# Patient Record
Sex: Female | Born: 1945 | Hispanic: Yes | Smoking: Never smoker
Health system: Southern US, Community
[De-identification: ages and names within clinical notes are randomized; demographics above are authoritative.]

## PROBLEM LIST (undated history)

## (undated) DIAGNOSIS — N939 Abnormal uterine and vaginal bleeding, unspecified: Secondary | ICD-10-CM

## (undated) DIAGNOSIS — C541 Malignant neoplasm of endometrium: Secondary | ICD-10-CM

## (undated) DIAGNOSIS — I10 Essential (primary) hypertension: Secondary | ICD-10-CM

## (undated) DIAGNOSIS — M8580 Other specified disorders of bone density and structure, unspecified site: Secondary | ICD-10-CM

## (undated) DIAGNOSIS — R7303 Prediabetes: Secondary | ICD-10-CM

## (undated) DIAGNOSIS — E119 Type 2 diabetes mellitus without complications: Secondary | ICD-10-CM

## (undated) DIAGNOSIS — E785 Hyperlipidemia, unspecified: Secondary | ICD-10-CM

## (undated) DIAGNOSIS — R740 Nonspecific elevation of levels of transaminase and lactic acid dehydrogenase [LDH]: Secondary | ICD-10-CM

## (undated) DIAGNOSIS — N950 Postmenopausal bleeding: Secondary | ICD-10-CM

## (undated) DIAGNOSIS — E669 Obesity, unspecified: Secondary | ICD-10-CM

## (undated) DIAGNOSIS — Z923 Personal history of irradiation: Secondary | ICD-10-CM

## (undated) HISTORY — DX: Nonspecific elevation of levels of transaminase and lactic acid dehydrogenase (ldh): R74.0

## (undated) HISTORY — PX: MULTIPLE TOOTH EXTRACTIONS: SHX2053

## (undated) HISTORY — DX: Essential (primary) hypertension: I10

## (undated) HISTORY — DX: Hyperlipidemia, unspecified: E78.5

## (undated) HISTORY — DX: Other specified disorders of bone density and structure, unspecified site: M85.80

## (undated) HISTORY — DX: Malignant neoplasm of endometrium: C54.1

## (undated) HISTORY — PX: TUBAL LIGATION: SHX77

## (undated) HISTORY — DX: Abnormal uterine and vaginal bleeding, unspecified: N93.9

## (undated) HISTORY — DX: Prediabetes: R73.03

## (undated) HISTORY — DX: Obesity, unspecified: E66.9

---
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
## 2001-01-03 ENCOUNTER — Ambulatory Visit (HOSPITAL_BASED_OUTPATIENT_CLINIC_OR_DEPARTMENT_OTHER): Admission: RE | Admit: 2001-01-03 | Discharge: 2001-01-03 | Payer: Self-pay | Admitting: General Surgery

## 2013-07-17 ENCOUNTER — Encounter: Payer: Self-pay | Admitting: *Deleted

## 2013-07-18 ENCOUNTER — Encounter: Payer: Self-pay | Admitting: Obstetrics & Gynecology

## 2013-07-18 ENCOUNTER — Other Ambulatory Visit (HOSPITAL_COMMUNITY)
Admission: RE | Admit: 2013-07-18 | Discharge: 2013-07-18 | Disposition: A | Discharge status: Home / Self Care | Payer: Self-pay | Source: Ambulatory Visit | Attending: Obstetrics & Gynecology | Admitting: Obstetrics & Gynecology

## 2013-07-18 ENCOUNTER — Ambulatory Visit (INDEPENDENT_AMBULATORY_CARE_PROVIDER_SITE_OTHER): Payer: Self-pay | Admitting: Obstetrics & Gynecology

## 2013-07-18 VITALS — BP 169/75 | HR 68 | Temp 97.2°F | Resp 16 | Ht 61.0 in | Wt 179.1 lb

## 2013-07-18 DIAGNOSIS — E669 Obesity, unspecified: Secondary | ICD-10-CM | POA: Insufficient documentation

## 2013-07-18 DIAGNOSIS — Z0000 Encounter for general adult medical examination without abnormal findings: Secondary | ICD-10-CM

## 2013-07-18 DIAGNOSIS — Z01419 Encounter for gynecological examination (general) (routine) without abnormal findings: Secondary | ICD-10-CM

## 2013-07-18 DIAGNOSIS — N950 Postmenopausal bleeding: Secondary | ICD-10-CM | POA: Insufficient documentation

## 2013-07-18 HISTORY — DX: Postmenopausal bleeding: N95.0

## 2013-07-18 NOTE — Progress Notes (Signed)
  Subjective:    Patient ID: Julie Orozco, female    DOB: 1946-01-15, 67 y.o.   MRN: [id number]  HPI This morbidly obese Hispanic lady is here today with the complaint of PMB. Initially she said this started last month. However, with further questioning, she reports that this occurred 2 years ago and then resolved until last month.   Review of Systems Pap and mammogram are due.    Objective:   Physical Exam   UPT negative, consent signed, time out done Cervix prepped with betadine and grasped with a single tooth tenaculum Uterus sounded to 12 cm Pipelle used for 3 passes with a large amount of tissue obtained. She tolerated the procedure well.       Assessment & Plan:  PMB- Await EMBX results Schedule mammogram Pap smear done today Schedule gyn u/s

## 2013-07-19 ENCOUNTER — Encounter: Payer: Self-pay | Admitting: *Deleted

## 2013-07-19 ENCOUNTER — Ambulatory Visit (HOSPITAL_COMMUNITY)
Admission: RE | Admit: 2013-07-19 | Discharge: 2013-07-19 | Disposition: A | Discharge status: Home / Self Care | Payer: Self-pay | Source: Ambulatory Visit | Attending: Obstetrics & Gynecology | Admitting: Obstetrics & Gynecology

## 2013-07-19 DIAGNOSIS — R9389 Abnormal findings on diagnostic imaging of other specified body structures: Secondary | ICD-10-CM | POA: Insufficient documentation

## 2013-07-19 DIAGNOSIS — N950 Postmenopausal bleeding: Secondary | ICD-10-CM | POA: Insufficient documentation

## 2013-07-30 ENCOUNTER — Ambulatory Visit (INDEPENDENT_AMBULATORY_CARE_PROVIDER_SITE_OTHER): Payer: Self-pay | Admitting: Obstetrics & Gynecology

## 2013-07-30 DIAGNOSIS — N950 Postmenopausal bleeding: Secondary | ICD-10-CM

## 2013-07-30 MED ORDER — MEDROXYPROGESTERONE ACETATE 10 MG PO TABS: ORAL_TABLET | ORAL | Status: DC

## 2013-08-07 ENCOUNTER — Encounter: Payer: Self-pay | Admitting: Obstetrics & Gynecology

## 2013-08-07 NOTE — Progress Notes (Signed)
   Subjective:    Patient ID: Julie Orozco, female    DOB: 1946-04-01, 67 y.o.   MRN: [id number]  HPI  67 yo who was seen recently for PMB. I did an EMBX which came back as normal and ordered an u/s which showed an endometrium 13 mm in thickness. She is no longer having PMB.  Review of Systems     Objective:   Physical Exam        Assessment & Plan:  I gave her the news of her benign pathology but I did tell her that we need to thin out her lining. I have given her Provera 10 mg for 10 days per month for the next 3 months. She will then have another u/s to assure thinning of the uterine lining. If her lining is still thickened, then she will need a d&c. RTC 5 months

## 2013-08-14 ENCOUNTER — Ambulatory Visit (HOSPITAL_COMMUNITY)
Admission: RE | Admit: 2013-08-14 | Discharge: 2013-08-14 | Disposition: A | Discharge status: Home / Self Care | Payer: Self-pay | Source: Ambulatory Visit | Attending: Obstetrics & Gynecology | Admitting: Obstetrics & Gynecology

## 2013-08-14 DIAGNOSIS — Z0000 Encounter for general adult medical examination without abnormal findings: Secondary | ICD-10-CM

## 2013-11-01 ENCOUNTER — Ambulatory Visit (HOSPITAL_COMMUNITY)
Admission: RE | Admit: 2013-11-01 | Discharge: 2013-11-01 | Disposition: A | Discharge status: Home / Self Care | Payer: Self-pay | Source: Ambulatory Visit | Attending: Obstetrics & Gynecology | Admitting: Obstetrics & Gynecology

## 2013-11-01 DIAGNOSIS — N950 Postmenopausal bleeding: Secondary | ICD-10-CM | POA: Insufficient documentation

## 2013-12-10 ENCOUNTER — Encounter: Payer: Self-pay | Admitting: Obstetrics & Gynecology

## 2013-12-10 ENCOUNTER — Ambulatory Visit (INDEPENDENT_AMBULATORY_CARE_PROVIDER_SITE_OTHER): Payer: Self-pay | Admitting: Obstetrics & Gynecology

## 2013-12-10 VITALS — BP 124/84 | HR 69 | Temp 97.7°F | Wt 183.4 lb

## 2013-12-10 DIAGNOSIS — R9389 Abnormal findings on diagnostic imaging of other specified body structures: Secondary | ICD-10-CM

## 2013-12-10 NOTE — Progress Notes (Signed)
Pt. Here today for follow up. Pt. States she is taking a BP medication but is unsure of what it is; is not taking medication we have on file.

## 2013-12-10 NOTE — Patient Instructions (Signed)
Dilatación y curetaje o curetaje por aspiración °(Dilation and Curettage or Vacuum Curettage) °La dilatación y el curetaje por aspiración son procedimientos menores. Consiste en la apertura (dilatación) del cuello del útero y el raspado (curetaje) en la superficie interna del útero. Durante este procedimiento, el tejido del interior del útero se raspa suavemente. Durante el curetaje por aspiración, se utiliza una succión suave para extirpar tejidos del útero.  °El curetaje podrá realizarse para diagnóstico o para tratar un problema. Como procedimiento diagnóstico, se realiza para examinar los tejidos del útero. Un diagnóstico con curetaje se realiza en caso de presentarse los siguientes síntomas:  °· Sangrado irregular en el útero. °· Hemorragias con coágulos. °· Pérdida de sangre entre los períodos menstruales. °· Períodos menstruales prolongados. °· Sangrado luego de la menopausia. °· Falta de periodo menstrual (amenorrea). °· Cambio en el tamaño y forma del útero. °Como tratamiento, el curetaje podrá realizarse por las siguientes causas:  °· Remoción del DIU (dispositivo intrauterino). °· Remoción de placenta retenida luego del parto. La placenta retenida puede causar una hemorragia lo suficientemente grave como para requerir transfusiones o puede provocar una infección. °· Aborto. °· Aborto espontáneo. °· Extirpación de pólipos en el interior del útero. °· Extirpación de fibromas no frecuentes (bultos no cancerosos). °INFORME A SU MÉDICO:  °· Cualquier alergia que tenga. °· Todos los medicamentos que utiliza, incluyendo vitaminas, hierbas, gotas oftálmicas, cremas y medicamentos de venta libre. °· Problemas previos que usted o los miembros de su familia hayan tenido con el uso de anestésicos. °· Enfermedades de la sangre. °· Cirugías previas. °· Padecimientos médicos. °RIESGOS Y COMPLICACIONES  °Generalmente es un procedimiento seguro. Sin embargo, como en cualquier procedimiento, pueden surgir complicaciones.  Las complicaciones posibles son: °· Sangrado excesivo. °· Infección en el útero. °· Lesión en el cuello del útero. °· Desarrollo de tejido cicatrizal (adherencias) dentro del útero que posteriormente causan hemorragia menstrual en cantidad anormal. °· Complicaciones de la anestesia general, si se ha usado. °· Perforación del útero. Esto es raro. °ANTES DEL PROCEDIMIENTO  °· Coma y beba antes del procedimiento sólo lo que le indique el médico. °· Arregle con alguna persona para que la lleve a su casa. °PROCEDIMIENTO  °El procedimiento demora entre 15 y 30 minutos. °· Podrán administrarle uno de los siguientes medicamentos: °¨ Medicamentos que adormecen el área del cuello uterino (anestesia local). °¨ Un medicamento para que duerma durante el procedimiento (anestesia general). °· Deberá recostarse sobre la espalda con las piernas en los estribos. °· Le colocarán en la vagina un instrumento metálico o plástico entibiado espéculo) para mantenerla abierta y permitir al profesional visualizar el cuello del útero. °· Hay dos formas en las que el cuello del útero puede ser ablandado y dilatado. Pueden ser: °¨ Tomar medicamentos. °¨ Inserción de unas varillas delgadas (laminarias) en el cuello del útero. °· Se utilizará un instrumento curvo (cureta)para raspar las células de la membrana que cubre el interior del útero. En algunos casos, se aplica una succión suave con la cureta. Luego la cureta se retirará. °DESPUÉS DEL PROCEDIMIENTO  °· Descansará en una sala de recuperación hasta que se sienta estable y lista para volver a su casa. °· Podrá tener náuseas o vómitos si le han administrado anestesia general. °· Sentirá dolor de garganta si le han colocado un tubo durante la anestesia general. °· Podrá sentir algunos cólicos y tener una pequeña hemorragia. Estas molestias pueden durar entre 2 días y 2 semanas después del procedimiento. °· Luego del procedimiento, el útero formará una   administrado anestesia general.   Sentir dolor de garganta si le han colocado un tubo durante la anestesia general.   Podr sentir algunos clicos y tener una pequea hemorragia. Estas molestias pueden durar entre 2 das y 2 semanas despus del procedimiento.   Luego del procedimiento, el tero formar una nueva cubierta. Esto puede hacer que el  prximo perodo se retrase.  Document Released: 02/02/2008 Document Revised: 04/18/2013  [employer] Patient Information 2014 [employer].

## 2013-12-10 NOTE — Progress Notes (Signed)
   Subjective:    Patient ID: Julie Orozco, female    DOB: 1946/04/23, 68 y.o.   MRN: [id number]  HPI 68 yo H lady with a h/o PMB. She had a negative EMBX last year and took provera in a cyclic fashion. An u/s done this month showed an endometrial stripe of 7 mm. She is not having PMB currently.  Review of Systems     Objective:   Physical Exam        Assessment & Plan:   Thickened endometrium in a post menopausal lady. I will schedule her for a dilation and curettage.

## 2014-01-16 NOTE — Patient Instructions (Addendum)
   Your procedure is scheduled on:  Friday, May 29  Enter through the Main Entrance of [facility] at:   Gove up the phone at the desk and dial [phone] and inform us of your arrival.  Please call this number if you have any problems the morning of surgery: [phone]  Remember: Do not eat food after midnight: Thursday Do not drink clear liquids after: 9 AM Friday, day of surgery Take these medicines the morning of surgery with a SIP OF WATER:  lisinopril  Do not wear jewelry, make-up, or FINGER nail polish No metal in your hair or on your body. Do not wear lotions, powders, perfumes.  You may wear deodorant.  Do not bring valuables to the hospital. Contacts, dentures or bridgework may not be worn into surgery.  Patients discharged on the day of surgery will not be allowed to drive home.  Home with daughter Violet.

## 2014-01-22 ENCOUNTER — Encounter (HOSPITAL_COMMUNITY)
Admission: RE | Admit: 2014-01-22 | Discharge: 2014-01-22 | Disposition: A | Discharge status: Home / Self Care | Payer: Self-pay | Source: Ambulatory Visit | Attending: Obstetrics & Gynecology | Admitting: Obstetrics & Gynecology

## 2014-01-22 ENCOUNTER — Encounter (HOSPITAL_COMMUNITY): Payer: Self-pay

## 2014-01-22 DIAGNOSIS — Z01812 Encounter for preprocedural laboratory examination: Secondary | ICD-10-CM | POA: Insufficient documentation

## 2014-01-22 DIAGNOSIS — Z01810 Encounter for preprocedural cardiovascular examination: Secondary | ICD-10-CM | POA: Insufficient documentation

## 2014-01-22 LAB — BASIC METABOLIC PANEL
BUN: 15 mg/dL (ref 6–23)
CALCIUM: 9.6 mg/dL (ref 8.4–10.5)
CO2: 28 meq/L (ref 19–32)
CREATININE: 0.61 mg/dL (ref 0.50–1.10)
Chloride: 100 mEq/L (ref 96–112)
Glucose, Bld: 103 mg/dL — ABNORMAL HIGH (ref 70–99)
Potassium: 4.7 mEq/L (ref 3.7–5.3)
SODIUM: 139 meq/L (ref 137–147)

## 2014-01-22 LAB — CBC
HCT: 41.9 % (ref 36.0–46.0)
Hemoglobin: 13.9 g/dL (ref 12.0–15.0)
MCH: 30.7 pg (ref 26.0–34.0)
MCHC: 33.2 g/dL (ref 30.0–36.0)
MCV: 92.5 fL (ref 78.0–100.0)
Platelets: 255 10*3/uL (ref 150–400)
RBC: 4.53 MIL/uL (ref 3.87–5.11)
RDW: 13.9 % (ref 11.5–15.5)
WBC: 9.5 10*3/uL (ref 4.0–10.5)

## 2014-01-22 NOTE — Pre-Procedure Instructions (Signed)
Used [employer], Administrator, sports during [employer].

## 2014-01-25 ENCOUNTER — Encounter (HOSPITAL_COMMUNITY)
Admission: RE | Disposition: A | Discharge status: Home / Self Care | Payer: Self-pay | Source: Ambulatory Visit | Attending: Obstetrics & Gynecology

## 2014-01-25 ENCOUNTER — Ambulatory Visit (HOSPITAL_COMMUNITY): Payer: Self-pay | Admitting: Anesthesiology

## 2014-01-25 ENCOUNTER — Encounter (HOSPITAL_COMMUNITY): Payer: Self-pay | Admitting: Anesthesiology

## 2014-01-25 ENCOUNTER — Ambulatory Visit (HOSPITAL_COMMUNITY)
Admission: RE | Admit: 2014-01-25 | Discharge: 2014-01-25 | Disposition: A | Discharge status: Home / Self Care | Payer: Self-pay | Source: Ambulatory Visit | Attending: Obstetrics & Gynecology | Admitting: Obstetrics & Gynecology

## 2014-01-25 DIAGNOSIS — N950 Postmenopausal bleeding: Secondary | ICD-10-CM | POA: Insufficient documentation

## 2014-01-25 DIAGNOSIS — I10 Essential (primary) hypertension: Secondary | ICD-10-CM | POA: Insufficient documentation

## 2014-01-25 DIAGNOSIS — Z79899 Other long term (current) drug therapy: Secondary | ICD-10-CM | POA: Insufficient documentation

## 2014-01-25 DIAGNOSIS — E669 Obesity, unspecified: Secondary | ICD-10-CM | POA: Insufficient documentation

## 2014-01-25 DIAGNOSIS — R9389 Abnormal findings on diagnostic imaging of other specified body structures: Secondary | ICD-10-CM | POA: Insufficient documentation

## 2014-01-25 HISTORY — PX: DILATION AND CURETTAGE OF UTERUS: SHX78

## 2014-01-25 SURGERY — DILATION AND CURETTAGE
Anesthesia: Monitor Anesthesia Care | Site: Vagina

## 2014-01-25 MED ORDER — FENTANYL CITRATE 0.05 MG/ML IJ SOLN
INTRAMUSCULAR | Status: AC
  Filled 2014-01-25: qty 2

## 2014-01-25 MED ORDER — FENTANYL CITRATE 0.05 MG/ML IJ SOLN: 25.0000 ug | INTRAMUSCULAR | Status: DC | PRN

## 2014-01-25 MED ORDER — LIDOCAINE HCL (CARDIAC) 20 MG/ML IV SOLN
INTRAVENOUS | Status: AC
  Filled 2014-01-25: qty 5

## 2014-01-25 MED ORDER — IBUPROFEN 600 MG PO TABS: 600.0000 mg | ORAL_TABLET | Freq: Four times a day (QID) | ORAL | Status: DC | PRN

## 2014-01-25 MED ORDER — MIDAZOLAM HCL 2 MG/2ML IJ SOLN
INTRAMUSCULAR | Status: AC
  Filled 2014-01-25: qty 2

## 2014-01-25 MED ORDER — KETOROLAC TROMETHAMINE 30 MG/ML IJ SOLN
INTRAMUSCULAR | Status: DC | PRN
Start: 2014-01-25 — End: 2014-01-25
  Administered 2014-01-25: 15 mg via INTRAVENOUS

## 2014-01-25 MED ORDER — DOXYCYCLINE HYCLATE 100 MG IV SOLR
100.0000 mg | Freq: Once | INTRAVENOUS | Status: AC
  Administered 2014-01-25: 100 mg via INTRAVENOUS
  Filled 2014-01-25: qty 100

## 2014-01-25 MED ORDER — PROPOFOL 10 MG/ML IV EMUL
INTRAVENOUS | Status: AC
  Filled 2014-01-25: qty 20

## 2014-01-25 MED ORDER — KETOROLAC TROMETHAMINE 30 MG/ML IJ SOLN: 15.0000 mg | Freq: Once | INTRAMUSCULAR | Status: DC | PRN

## 2014-01-25 MED ORDER — MIDAZOLAM HCL 2 MG/2ML IJ SOLN
INTRAMUSCULAR | Status: DC | PRN
  Administered 2014-01-25: 1 mg via INTRAVENOUS

## 2014-01-25 MED ORDER — LIDOCAINE HCL (CARDIAC) 20 MG/ML IV SOLN
INTRAVENOUS | Status: DC | PRN
  Administered 2014-01-25: 50 mg via INTRAVENOUS

## 2014-01-25 MED ORDER — LACTATED RINGERS IV SOLN
INTRAVENOUS | Status: DC
  Administered 2014-01-25: 11:00:00 via INTRAVENOUS

## 2014-01-25 MED ORDER — PROPOFOL 10 MG/ML IV EMUL
INTRAVENOUS | Status: DC | PRN
  Administered 2014-01-25 (×2): 20 mg via INTRAVENOUS
  Administered 2014-01-25: 30 mg via INTRAVENOUS

## 2014-01-25 MED ORDER — BUPIVACAINE HCL 0.5 % IJ SOLN
INTRAMUSCULAR | Status: DC | PRN
  Administered 2014-01-25: 30 mL

## 2014-01-25 MED ORDER — ONDANSETRON HCL 4 MG/2ML IJ SOLN
INTRAMUSCULAR | Status: DC | PRN
  Administered 2014-01-25: 4 mg via INTRAVENOUS

## 2014-01-25 MED ORDER — MEPERIDINE HCL 25 MG/ML IJ SOLN: 6.2500 mg | INTRAMUSCULAR | Status: DC | PRN

## 2014-01-25 MED ORDER — ONDANSETRON HCL 4 MG/2ML IJ SOLN
INTRAMUSCULAR | Status: AC
  Filled 2014-01-25: qty 2

## 2014-01-25 MED ORDER — BUPIVACAINE HCL (PF) 0.5 % IJ SOLN
INTRAMUSCULAR | Status: AC
Start: 2014-01-25 — End: 2014-01-25
  Filled 2014-01-25: qty 30

## 2014-01-25 MED ORDER — FENTANYL CITRATE 0.05 MG/ML IJ SOLN
INTRAMUSCULAR | Status: DC | PRN
  Administered 2014-01-25 (×2): 50 ug via INTRAVENOUS

## 2014-01-25 MED ORDER — ONDANSETRON HCL 4 MG/2ML IJ SOLN: 4.0000 mg | Freq: Once | INTRAMUSCULAR | Status: DC | PRN

## 2014-01-25 SURGICAL SUPPLY — 15 items
CATH ROBINSON RED A/P 16FR (CATHETERS) ×2 IMPLANT
CLOTH BEACON ORANGE TIMEOUT ST (SAFETY) ×2 IMPLANT
CONTAINER PREFILL 10% NBF 60ML (FORM) IMPLANT
DILATOR CANAL MILEX (MISCELLANEOUS) IMPLANT
DRSG TELFA 3X8 NADH (GAUZE/BANDAGES/DRESSINGS) ×2 IMPLANT
GLOVE BIO SURGEON STRL SZ 6.5 (GLOVE) ×2 IMPLANT
GOWN STRL REUS W/TWL LRG LVL3 (GOWN DISPOSABLE) ×4 IMPLANT
NDL SPNL 18GX3.5 QUINCKE PK (NEEDLE) ×1 IMPLANT
NEEDLE SPNL 18GX3.5 QUINCKE PK (NEEDLE) ×2 IMPLANT
PACK VAGINAL MINOR WOMEN LF (CUSTOM PROCEDURE TRAY) ×2 IMPLANT
PAD DRESSING TELFA 3X8 NADH (GAUZE/BANDAGES/DRESSINGS) ×1 IMPLANT
PAD OB MATERNITY 4.3X12.25 (PERSONAL CARE ITEMS) ×2 IMPLANT
PAD PREP 24X48 CUFFED NSTRL (MISCELLANEOUS) ×2 IMPLANT
SYR CONTROL 10ML LL (SYRINGE) ×2 IMPLANT
TOWEL OR 17X24 6PK STRL BLUE (TOWEL DISPOSABLE) ×4 IMPLANT

## 2014-01-25 NOTE — Anesthesia Preprocedure Evaluation (Signed)
Anesthesia Evaluation  Patient identified by MRN, date of birth, ID band  Reviewed: Allergy & Precautions, H&P , NPO status , Patient's Chart, lab work & pertinent test results  Airway Mallampati: I TM Distance: >3 FB Neck ROM: full    Dental no notable dental hx. (+) Teeth Intact   Pulmonary neg pulmonary ROS,    Pulmonary exam normal       Cardiovascular hypertension, Pt. on medications Rhythm:regular Rate:Normal     Neuro/Psych negative neurological ROS  negative psych ROS   GI/Hepatic negative GI ROS, Neg liver ROS,   Endo/Other  negative endocrine ROS  Renal/GU negative Renal ROS     Musculoskeletal   Abdominal (+) + obese,   Peds  Hematology negative hematology ROS (+)   Anesthesia Other Findings   Reproductive/Obstetrics negative OB ROS                           Anesthesia Physical Anesthesia Plan  ASA: II  Anesthesia Plan: MAC   Post-op Pain Management:    Induction: Intravenous  Airway Management Planned:   Additional Equipment:   Intra-op Plan:   Post-operative Plan:   Informed Consent: I have reviewed the patients History and Physical, chart, labs and discussed the procedure including the risks, benefits and alternatives for the proposed anesthesia with the patient or authorized representative who has indicated his/her understanding and acceptance.     Plan Discussed with: CRNA and Surgeon  Anesthesia Plan Comments:         Anesthesia Quick Evaluation

## 2014-01-25 NOTE — Discharge Instructions (Signed)
Histeroscopía - Cuidados posteriores  (Hysteroscopy, Care After)  Siga estas instrucciones durante las próximas semanas. Estas indicaciones le proporcionan información general acerca de cómo deberá cuidarse después del procedimiento. El médico también podrá darle instrucciones más específicas. El tratamiento se ha planificado de acuerdo a las prácticas médicas actuales, pero a veces se producen problemas. Comuníquese con el médico si tiene algún problema o tiene dudas después del procedimiento.   QUÉ ESPERAR DESPUÉS DEL PROCEDIMIENTO  Después del procedimiento, es típico tener las siguientes sensaciones:  · Podrá sentir cólicos leves. Es normal que esto dure un par de días.  · Aumenta el sangrado. Puede variar desde un sangrado ligero durante un par de días hasta un sangrado similar al menstrual por 3 - 7 días.  INSTRUCCIONES PARA EL CUIDADO EN EL HOGAR  · Descanse durante los primeros 1-2 días después del procedimiento.  · Tome sólo medicamentos de venta libre o recetados, según las indicaciones del médico. No tome aspirina. La aspirina puede aumentar el riesgo de sangrado.  · Tome sólo duchas y no baños durante 2 semanas, según las indicaciones de su médico.  · No conduzca por 24 horas o siga las indicaciones de su médico.  · No beba alcohol si toma analgésicos.  · No utilice tampones, duchas vaginales ni tenga relaciones sexuales durante 2 semanas o hasta que el profesional la autorice.  · Tómese la temperatura dos veces por día, durante 4 ó 5 días. Anótela cada vez.  · Siga las indicaciones de su médico acerca de la dieta, la actividad física y levantar objetos pesados.  · Si está constipada, usted puede:  · Tomar un laxante suave si su médico la autoriza.  · Agregar salvado a su dieta.  · Beba suficiente líquido para mantener la orina clara o de color amarillo pálido.  · Pídale a alguna persona que permanezca con usted durante las primeras 24 a 48 horas, especialmente si le han administrado anestesia  general.  · Concurra a las consultas de control con su médico según las indicaciones.  SOLICITE ATENCIÓN MÉDICA SI:  · Se siente mareado o sufre un desmayo.  · Tiene malestar estomacal (náuseas).  · Tiene flujo vaginal anormal.  · Tiene una erupción.  · Aumenta el dolor y no puede controlarlo con la medicación.  SOLICITE ATENCIÓN MÉDICA DE INMEDIATO SI:  · Tiene coágulos de sangre o una hemorragia más abundante que un período menstrual normal.  · Tiene fiebre.  · Aumenta el dolor y no puede controlarlo con la medicación.  · Siente un dolor nuevo en el vientre (abdominal).  · Se desmaya.  · Tiene dolor en los hombros (en la zona donde van los breteles).  · Le falta el aire.  Document Released: 06/06/2013  [employer]® Patient Information ©2014 [employer].

## 2014-01-25 NOTE — Transfer of Care (Signed)
Immediate Anesthesia Transfer of Care Note  Patient: Julie Orozco  Procedure(s) Performed: Procedure(s): DILATATION AND CURETTAGE (N/A)  Patient Location: PACU  Anesthesia Type:MAC  Level of Consciousness: awake, alert  and oriented  Airway & Oxygen Therapy: Patient Spontanous Breathing  Post-op Assessment: Report given to PACU RN and Post -op Vital signs reviewed and stable  Post vital signs: Reviewed and stable  Complications: No apparent anesthesia complications

## 2014-01-25 NOTE — H&P (Signed)
68 yo H lady with a h/o PMB. She had a negative EMBX last year and took provera in a cyclic fashion. An u/s done last month showed an endometrial stripe of 7 mm. She is not having PMB currently.   Pertinent Gynecological History: Menses: post-menopausal Bleeding: none currently Contraception: post menopausal status DES exposure: denies Blood transfusions: none Sexually transmitted diseases: no past history Previous GYN Procedures: embx  Last mammogram: normal Last pap: normal Date    Menstrual History:  No LMP recorded. Patient is postmenopausal.    Past Medical History  Diagnosis Date  . Hypertension   . Abnormal uterine bleeding   . Obesity   . SVD (spontaneous vaginal delivery)     x 8    Past Surgical History  Procedure Laterality Date  . Tubal ligation    . Multiple tooth extractions      Family History  Problem Relation Age of Onset  . Hypertension Mother   . Alcohol abuse Father   . Anemia Brother   . Alcohol abuse Brother     Social History:  reports that she has never smoked. She has never used smokeless tobacco. She reports that she does not drink alcohol or use illicit drugs.  Allergies: No Known Allergies  Prescriptions prior to admission  Medication Sig Dispense Refill  . acetaminophen (TYLENOL) 325 MG tablet Take 650 mg by mouth every 6 (six) hours as needed (For fatigue.).      Marland Kitchen lisinopril (PRINIVIL,ZESTRIL) 10 MG tablet Take 10 mg by mouth daily.      . Multiple Vitamin (MULTIVITAMIN WITH MINERALS) TABS tablet Take 1 tablet by mouth daily.        ROS  Blood pressure 136/69, pulse 70, temperature 98.4 F (36.9 C), temperature source Oral, resp. rate 20, SpO2 99.00%. Physical Exam Heart-rrr Lungs- CTAB Abd- benign No results found for this or any previous visit (from the past 24 hour(s)).  No results found.  Assessment/Plan: PMB s/p cyclic provera with continued thickened endometrium. Plan for d&c.  She understands the risks of  surgery, including, but not to infection, bleeding, DVTs, damage to bowel, bladder, ureters. She wishes to proceed.     Emily Filbert 01/25/2014, 11:23 AM

## 2014-01-25 NOTE — Op Note (Signed)
01/25/2014  12:56 PM  PATIENT:  Julie Orozco  68 y.o. female  PRE-OPERATIVE DIAGNOSIS:  POST MENOPAUSAL BLEEDING  POST-OPERATIVE DIAGNOSIS:  POST MENOPAUSAL BLEEDING  PROCEDURE:  Procedure(s): DILATATION AND CURETTAGE (N/A)  SURGEON:  Surgeon(s) and Role:    * Emily Filbert, MD - Primary  PHYSICIAN ASSISTANT:   ASSISTANTS: none   ANESTHESIA:   paracervical block and MAC  EBL:  Total I/O In: 500 [I.V.:500] Out: 20 [Blood:20]  BLOOD ADMINISTERED:none  DRAINS: none   LOCAL MEDICATIONS USED:  MARCAINE     SPECIMEN:  Source of Specimen:  uterine curettings  DISPOSITION OF SPECIMEN:  PATHOLOGY  COUNTS:  YES  TOURNIQUET:  * No tourniquets in log *  DICTATION: .Dragon Dictation  PLAN OF CARE: Discharge to home after PACU  PATIENT DISPOSITION:  PACU - hemodynamically stable.   Delay start of Pharmacological VTE agent (>24hrs) due to surgical blood loss or risk of bleeding: not applicable   The risks, benefits, and alternatives of surgery were explained, understood, and accepted. All questions were answered. Consents were signed. In the operating room MAC anesthesia was applied without complication, and she was placed in the dorsal lithotomy position. Her vagina was prepped and draped in the usual sterile fashion. A bimanual exam revealed a NSSA uterus. Her adnexa were nonenlarged. A speculum was placed and a single-tooth tenaculum was used to grasp the anterior lip of her cervix. A total of 30 mL of 0.5% Marcaine was used to perform a paracervical block. Her uterus sounded to 9 cm. Her cervix was carefully and slowly dilated to accomodate a small curette. A curettage was done in all quadrants and the fundus of the uterus. A small amount of tissue was obtained. There was no bleeding noted at the end of the case. She was taken to the recovery room after being extubated. She tolerated the procedure well.

## 2014-01-25 NOTE — Anesthesia Postprocedure Evaluation (Signed)
Anesthesia Post Note  Patient: Julie Orozco  Procedure(s) Performed: Procedure(s) (LRB): DILATATION AND CURETTAGE (N/A)  Anesthesia type: MAC  Patient location: PACU  Post pain: Pain level controlled  Post assessment: Post-op Vital signs reviewed  Last Vitals:  Filed Vitals:   01/25/14 1230  BP: 114/55  Pulse: 56  Temp:   Resp: 24    Post vital signs: Reviewed  Level of consciousness: sedated  Complications: No apparent anesthesia complications

## 2014-01-28 ENCOUNTER — Encounter (HOSPITAL_COMMUNITY): Payer: Self-pay | Admitting: Obstetrics & Gynecology

## 2016-06-02 ENCOUNTER — Encounter: Payer: Self-pay | Admitting: Family Medicine

## 2016-06-02 ENCOUNTER — Ambulatory Visit (INDEPENDENT_AMBULATORY_CARE_PROVIDER_SITE_OTHER): Payer: Medicare Other | Admitting: Family Medicine

## 2016-06-02 VITALS — BP 150/82 | HR 63 | Ht 59.25 in | Wt 177.4 lb

## 2016-06-02 DIAGNOSIS — Z23 Encounter for immunization: Secondary | ICD-10-CM | POA: Diagnosis not present

## 2016-06-02 DIAGNOSIS — I10 Essential (primary) hypertension: Secondary | ICD-10-CM | POA: Insufficient documentation

## 2016-06-02 DIAGNOSIS — Z7689 Persons encountering health services in other specified circumstances: Secondary | ICD-10-CM | POA: Diagnosis not present

## 2016-06-02 DIAGNOSIS — I152 Hypertension secondary to endocrine disorders: Secondary | ICD-10-CM | POA: Insufficient documentation

## 2016-06-02 LAB — CBC WITH DIFFERENTIAL/PLATELET
BASOS PCT: 1 %
Basophils Absolute: 79 cells/uL (ref 0–200)
EOS ABS: 158 {cells}/uL (ref 15–500)
Eosinophils Relative: 2 %
HEMATOCRIT: 40.1 % (ref 35.0–45.0)
Hemoglobin: 13.8 g/dL (ref 11.7–15.5)
LYMPHS PCT: 32 %
Lymphs Abs: 2528 cells/uL (ref 850–3900)
MCH: 31.2 pg (ref 27.0–33.0)
MCHC: 34.4 g/dL (ref 32.0–36.0)
MCV: 90.7 fL (ref 80.0–100.0)
MONO ABS: 711 {cells}/uL (ref 200–950)
MPV: 12.1 fL (ref 7.5–12.5)
Monocytes Relative: 9 %
NEUTROS ABS: 4424 {cells}/uL (ref 1500–7800)
Neutrophils Relative %: 56 %
Platelets: 254 10*3/uL (ref 140–400)
RBC: 4.42 MIL/uL (ref 3.80–5.10)
RDW: 13.2 % (ref 11.0–15.0)
WBC: 7.9 10*3/uL (ref 4.0–10.5)

## 2016-06-02 LAB — COMPLETE METABOLIC PANEL WITH GFR
ALT: 28 U/L (ref 6–29)
AST: 24 U/L (ref 10–35)
Albumin: 4.4 g/dL (ref 3.6–5.1)
Alkaline Phosphatase: 99 U/L (ref 33–130)
BILIRUBIN TOTAL: 0.5 mg/dL (ref 0.2–1.2)
BUN: 13 mg/dL (ref 7–25)
CALCIUM: 9.6 mg/dL (ref 8.6–10.4)
CO2: 28 mmol/L (ref 20–31)
CREATININE: 0.62 mg/dL (ref 0.50–0.99)
Chloride: 102 mmol/L (ref 98–110)
GFR, Est Non African American: >89 mL/min (ref >=60)
Glucose, Bld: 98 mg/dL (ref 65–99)
Potassium: 4.3 mmol/L (ref 3.5–5.3)
Sodium: 139 mmol/L (ref 135–146)
TOTAL PROTEIN: 7.7 g/dL (ref 6.1–8.1)

## 2016-06-02 NOTE — Progress Notes (Signed)
Subjective:    Patient ID: Julie Orozco, female    DOB: 11-18-1945, 70 y.o.   MRN: [id number]  HPI Chief Complaint  Patient presents with  . new pt    new pt, get establish. bp issues   She is here with an interpreter for an establish care visit and to discuss blood pressure.  Unable to get her previous medical records. She was going to [place] at a general practice clinic in [place] and her last visit was 1 1/2 years ago. States the clinic has closed.   States she was diagnosed with HTN approximately 2 years ago and has been out of blood pressure medication for a year. Complains of burry vision on some days, ongoing for about 6 months. This is not weekly, occurs maybe once a month and lasts 30 minutes to 1 hour.  Last eye exam was 2015. Prescription eyeglasses with her and she wears them when reading or working.   Denies fever, chills, dizziness, headache, chest pain, palpitations, DOE, LE edema, abdominal pain, GI or GU symptoms.   PMH: HTN, obesity.  Surgeries: D and C for vaginal bleeding.   Other providers: none  Denies smoking, alcohol use or drug use.  Is not currently working. Used to work as a Furniture conservator/restorer and quit 3 months ago.  Lives alone. 8 children in the area. Divorced.   Colonoscopy: never Mammogram: 2015    She would like a flu shot today.   Past Medical History:  Diagnosis Date  . Abnormal uterine bleeding   . Hypertension   . Obesity   . SVD (spontaneous vaginal delivery)    x 8   Past Surgical History:  Procedure Laterality Date  . DILATION AND CURETTAGE OF UTERUS N/A 01/25/2014   Procedure: DILATATION AND CURETTAGE;  Surgeon: Emily Filbert, MD;  Location: [facility];  Service: Gynecology;  Laterality: N/A;  . MULTIPLE TOOTH EXTRACTIONS    . TUBAL LIGATION     Current Outpatient Prescriptions on File Prior to Visit  Medication Sig Dispense Refill  . Multiple Vitamin (MULTIVITAMIN WITH MINERALS) TABS tablet Take 1 tablet by mouth daily.    Marland Kitchen  ibuprofen (ADVIL,MOTRIN) 600 MG tablet Take 1 tablet (600 mg total) by mouth every 6 (six) hours as needed. 30 tablet 1  . lisinopril (PRINIVIL,ZESTRIL) 10 MG tablet Take 10 mg by mouth daily.     No current facility-administered medications on file prior to visit.    Reviewed allergies, medications, past medical, surgical,and social history.     Review of Systems Pertinent positives and negatives in the history of present illness.     Objective:   Physical Exam BP (!) 150/82   Pulse 63   Ht 4' 11.25" (1.505 m)   Wt 177 lb 6.4 oz (80.5 kg)   BMI 35.53 kg/m  Alert and in no distress.  Cardiac exam shows a regular sinus rhythm without murmurs or gallops. Lungs are clear to auscultation. Extremities without edema, pulses normal       Assessment & Plan:  Essential hypertension - Plan: CBC with Differential/Platelet, COMPLETE METABOLIC PANEL WITH GFR  Encounter to establish care  Need for prophylactic vaccination and inoculation against influenza - Plan: Flu vaccine HIGH DOSE PF  Discussed that her blood pressure today is slightly elevated but she has not been taking her blood pressure medication in almost a year.  Plan to check basic labs, start her back on blood pressure medication and have her return in 2 weeks for  a blood pressure check or she can schedule her CPE and fasting labs and we can check her BP at that appointment.  Flu shot given.

## 2016-06-02 NOTE — Patient Instructions (Addendum)
Start back on your blood pressure medication and return for a fasting lab check and complete physical exam sometime in the next 2-4 weeks. Please request an interpreter to come with you to your appointment.  I am checking basic labs today and we will call you with these results.    Plan de alimentacin DASH (DASH Eating Plan) DASH es la sigla en ingls de "Enfoques Alimentarios para Detener la Hipertensin". El plan de alimentacin DASH ha demostrado bajar la presin arterial elevada (hipertensin). Los beneficios adicionales para la salud pueden incluir la disminucin del riesgo de diabetes mellitus tipo2, enfermedades cardacas e ictus. Este plan tambin puede ayudar a Horticulturist, commercial. QU DEBO SABER ACERCA DEL PLAN DE ALIMENTACIN DASH? Para el plan de alimentacin DASH, seguir las siguientes pautas generales:  Elija los alimentos con un valor porcentual diario de sodio de menos del 5% (segn figura en la etiqueta del alimento).  Use hierbas o aderezos sin sal, en lugar de sal de mesa o sal marina.  Consulte al mdico o farmacutico antes de usar sustitutos de la sal.  Coma productos con bajo contenido de sodio, cuya etiqueta suele decir "bajo contenido de sodio" o "sin agregado de sal".  Coma alimentos frescos.  Coma ms verduras, frutas y productos lcteos con bajo contenido de [place].  Elija los cereales integrales. Busque la palabra "integral" en Equities trader de la lista de ingredientes.  Elija el pescado y el pollo o el pavo sin piel ms a menudo que las carnes rojas. Limite el consumo de pescado, carne de ave y carne a 6onzas (170g) por Training and development officer.  Limite el consumo de dulces, postres, azcares y bebidas azucaradas.  Elija las grasas saludables para el corazn.  Limite el consumo de queso a 1onza (28g) por Training and development officer.  Consuma ms comida casera y menos de restaurante, de buf y comida rpida.  Limite el consumo de alimentos fritos.  Cocine los alimentos utilizando mtodos que no  sean la fritura.  Limite las verduras enlatadas. Si las consume, enjuguelas bien para disminuir el sodio.  Cuando coma en un restaurante, pida que preparen su comida con menos sal o, en lo posible, sin nada de sal. QU ALIMENTOS PUEDO COMER? Pida ayuda a un nutricionista para conocer las necesidades calricas individuales. Cereales Pan de salvado o integral. Arroz integral. Pastas de salvado o integrales. Quinua, trigo burgol y cereales integrales. Cereales con bajo contenido de sodio. Tortillas de harina de maz o de salvado. Pan de maz integral. Galletas saladas integrales. Galletas con bajo contenido de [place]. Vegetales Verduras frescas o congeladas (crudas, al vapor, asadas o grilladas). Jugos de tomate y verduras con contenido bajo o reducido de sodio. Pasta y salsa de tomate con contenido bajo o [place]. Verduras enlatadas con bajo contenido de sodio o reducido de sodio.  Lambert Mody Lambert Mody frescas, en conserva (en su jugo natural) o frutas congeladas. Carnes y otros productos con protenas Carne de res molida (al 85% o ms [place]), carne de res de animales alimentados con pastos o carne de res sin la grasa. Pollo o pavo sin piel. Carne de pollo o de [place]. Cerdo sin la grasa. Todos los pescados y frutos de mar. Huevos. Porotos, guisantes o lentejas secos. Frutos secos y semillas sin sal. Frijoles enlatados sin sal. Lcteos Productos lcteos con bajo contenido de grasas, como [place] o al 1%, quesos reducidos en grasas o al 2%, ricota con bajo contenido de grasas o [employer], o yogur natural con bajo contenido de [place].  Quesos con contenido bajo o reducido de sodio. Grasas y Naval architect en barra que no contengan grasas trans. [place]ones[place] y alios para ensaladas livianos o reducidos en grasas (reducidos en sodio). [place]. Aceites de crtamo, oliva o canola. [place] natural de man o almendra. Otros Palomitas de maz y pretzels sin sal. Los artculos  mencionados arriba pueden no ser [employer] de las bebidas o los alimentos recomendados. Comunquese con el nutricionista para conocer ms opciones. QU ALIMENTOS NO SE RECOMIENDAN? Cereales Pan blanco. Pastas blancas. Arroz blanco. Pan de maz refinado. Bagels y croissants. Galletas saladas que contengan grasas trans. Vegetales Vegetales con crema o fritos. Verduras en [place]. Verduras enlatadas comunes. Pasta y salsa de tomate en lata comunes. Jugos comunes de tomate y de verduras. Lambert Mody Frutas secas. Fruta enlatada en almbar liviano o espeso. Jugo de frutas. Carnes y otros productos con protenas [place] de carne con Lobbyist. Costillas, alas de pollo, tocineta, salchicha, mortadela, salame, chinchulines, tocino, perros calientes, salchichas alemanas y embutidos envasados. Frutos secos y semillas con sal. Frijoles con sal en lata. Lcteos Leche entera o al 2%, crema, mezcla de [place] y crema, y queso crema. Yogur entero o endulzado. Quesos o queso azul con alto contenido de Physicist, medical. [place] no lcteas y coberturas batidas. Quesos procesados, quesos para untar o cuajadas. Condimentos Sal de cebolla y ajo, sal condimentada, sal de mesa y sal marina. Salsas en lata y envasadas. Salsa Worcestershire. Salsa trtara. Salsa barbacoa. Salsa teriyaki. Salsa de soja, incluso la que tiene contenido reducido de [place]. Salsa de carne. Salsa de pescado. Salsa de [place]. Salsa rosada. Rbano picante. Ketchup y mostaza. Saborizantes y tiernizantes para carne. Caldo en cubitos. Salsa picante. Salsa tabasco. Adobos. Aderezos para tacos. Salsas. Grasas y aceites [place], [place] en barra, [place] de [place], [place], [place] clarificada y Wendee Copp de tocino. Aceites de coco, de palmiste o de palma. Aderezos comunes para ensalada. Otros Pickles y [place]. Palomitas de maz y pretzels con sal. Los artculos mencionados arriba pueden no ser [employer] de las bebidas y los alimentos que se  Higher education careers adviser. Comunquese con el nutricionista para obtener ms informacin. DNDE Dolan Amen MS INFORMACIN? [place], del Pulmn y de Herbalist (National Heart, Lung, and [place]): travelstabloid.com   Esta informacin no tiene Marine scientist el consejo del mdico. Asegrese de hacerle al mdico cualquier pregunta que tenga.   Document Released: 08/05/2011 Document Revised: 09/06/2014 Elsevier Interactive Patient Education [employer].

## 2016-06-03 ENCOUNTER — Telehealth: Payer: Self-pay | Admitting: Family Medicine

## 2016-06-03 NOTE — Telephone Encounter (Signed)
Pt son called and states that pt is out of BP medicine  Lisinopril states someone was going to call it in yesterday while the pt was here, pt uses [place], [place]

## 2016-06-03 NOTE — Telephone Encounter (Signed)
Please call this in for her

## 2016-06-04 MED ORDER — LISINOPRIL 10 MG PO TABS
10.0000 mg | ORAL_TABLET | Freq: Every day | ORAL | 5 refills | Status: DC
Start: 2016-06-04 — End: 2017-04-11

## 2016-06-04 NOTE — Telephone Encounter (Signed)
done

## 2016-06-14 ENCOUNTER — Encounter: Payer: Self-pay | Admitting: Family Medicine

## 2016-06-14 ENCOUNTER — Encounter: Payer: Self-pay | Admitting: Gastroenterology

## 2016-06-14 ENCOUNTER — Ambulatory Visit (INDEPENDENT_AMBULATORY_CARE_PROVIDER_SITE_OTHER): Payer: Medicare Other | Admitting: Family Medicine

## 2016-06-14 VITALS — BP 124/76 | HR 64 | Ht 60.75 in | Wt 176.0 lb

## 2016-06-14 DIAGNOSIS — Z13220 Encounter for screening for lipoid disorders: Secondary | ICD-10-CM

## 2016-06-14 DIAGNOSIS — Z1231 Encounter for screening mammogram for malignant neoplasm of breast: Secondary | ICD-10-CM | POA: Diagnosis not present

## 2016-06-14 DIAGNOSIS — Z23 Encounter for immunization: Secondary | ICD-10-CM | POA: Diagnosis not present

## 2016-06-14 DIAGNOSIS — Z1211 Encounter for screening for malignant neoplasm of colon: Secondary | ICD-10-CM | POA: Diagnosis not present

## 2016-06-14 DIAGNOSIS — E669 Obesity, unspecified: Secondary | ICD-10-CM | POA: Diagnosis not present

## 2016-06-14 DIAGNOSIS — Z0000 Encounter for general adult medical examination without abnormal findings: Secondary | ICD-10-CM

## 2016-06-14 DIAGNOSIS — I10 Essential (primary) hypertension: Secondary | ICD-10-CM | POA: Diagnosis not present

## 2016-06-14 DIAGNOSIS — Z1239 Encounter for other screening for malignant neoplasm of breast: Secondary | ICD-10-CM

## 2016-06-14 DIAGNOSIS — L989 Disorder of the skin and subcutaneous tissue, unspecified: Secondary | ICD-10-CM | POA: Diagnosis not present

## 2016-06-14 DIAGNOSIS — Z8639 Personal history of other endocrine, nutritional and metabolic disease: Secondary | ICD-10-CM | POA: Diagnosis not present

## 2016-06-14 DIAGNOSIS — E2839 Other primary ovarian failure: Secondary | ICD-10-CM | POA: Diagnosis not present

## 2016-06-14 LAB — TSH: TSH: 2.08 m[IU]/L

## 2016-06-14 NOTE — Progress Notes (Signed)
Julie Orozco is a 70 y.o. female who presents for annual wellness visit and follow-up on chronic medical conditions.  She has the following concerns: none She is here with a Forensic scientist.    Immunization History  Administered Date(s) Administered  . Influenza, High Dose Seasonal PF 06/02/2016  . Tdap 08/31/2011   Last Pap smear: 2015- had hysterectomy for heavy bleeding.  Last mammogram: 2015 Last colonoscopy: never Last DEXA:  never Dentist: long time ago- dentures upper and lower.  Ophtho: 3 years ago - prescription glasses. [facility].  Diet: somewhat healthy Exercise: walking couple times a week.   Other doctors caring for patient include: none  Lives alone. Has family nearby.    Depression screen:  See questionnaire below.  Depression screen PHQ 2/9 06/14/2016  Decreased Interest 0  Down, Depressed, Hopeless 0  PHQ - 2 Score 0    Fall Risk Screen: see questionnaire below. Fall Risk  06/14/2016  Falls in the past year? No    ADL screen:  See questionnaire below Functional Status Survey: Is the patient deaf or have difficulty hearing?: No Does the patient have difficulty seeing, even when wearing glasses/contacts?:  (has glasses to read) Does the patient have difficulty concentrating, remembering, or making decisions?: Yes (sometimes concentrating) Does the patient have difficulty walking or climbing stairs?: No Does the patient have difficulty dressing or bathing?: No Does the patient have difficulty doing errands alone such as visiting a doctor's office or shopping?: No   End of Life Discussion:  Patient does not have a living will and medical power of attorney. Forms given and explained. She will take these home to discuss with family   Review of Systems Constitutional: -fever, -chills, -sweats, -unexpected weight change, -anorexia, -fatigue Allergy: -sneezing, -itching, -congestion Dermatology: denies changing moles, rash, lumps, new worrisome  lesions ENT: -runny nose, -ear pain, -sore throat, -hoarseness, -sinus pain, -teeth pain, -tinnitus, -hearing loss, -epistaxis Cardiology:  -chest pain, -palpitations, -edema, -orthopnea, -paroxysmal nocturnal dyspnea Respiratory: -cough, -shortness of breath, -dyspnea on exertion, -wheezing, -hemoptysis Gastroenterology: -abdominal pain, -nausea, -vomiting, -diarrhea, -constipation, -blood in stool, -changes in bowel movement, -dysphagia Hematology: -bleeding or bruising problems Musculoskeletal: -arthralgias, -myalgias, -joint swelling, -back pain, -neck pain, -cramping, -gait changes Ophthalmology: -vision changes, -eye redness, -itching, -discharge Urology: -dysuria, -difficulty urinating, -hematuria, -urinary frequency, -urgency, incontinence Neurology: -headache, -weakness, -tingling, -numbness, -speech abnormality, -memory loss, -falls, -dizziness Psychology:  -depressed mood, -agitation, -sleep problems  PHYSICAL EXAM:  BP 124/76   Pulse 64   Ht 5' 0.75" (1.543 m)   Wt 176 lb (79.8 kg)   BMI 33.53 kg/m   General Appearance: Alert, cooperative, no distress, appears stated age Head: Normocephalic, without obvious abnormality, atraumatic Eyes: PERRL, conjunctiva/corneas clear, EOM's intact, fundi benign Ears: Normal TM's and external ear canals Nose: Nares normal, mucosa normal, no drainage or sinus tenderness Throat: Lips, mucosa, and tongue normal; teeth and gums normal Neck: Supple, no lymphadenopathy; thyroid: no enlargement/tenderness/nodules; no carotid bruit or JVD Back: Spine nontender, no curvature, ROM normal, no CVA tenderness Lungs: Clear to auscultation bilaterally without wheezes, rales or ronchi; respirations unlabored Chest Wall: No tenderness or deformity Heart: Regular rate and rhythm, S1 and S2 normal, no murmur, rub or gallop Breast Exam: sending for mammogram. No axillary lymphadenopathy Abdomen: Soft, non-tender, nondistended, normoactive bowel sounds, no  masses, no hepatosplenomegaly Genitalia: refused. Hysterectomy.  Rectal: referral for colonoscopy Extremities: No clubbing, cyanosis or edema Pulses: 2+ and symmetric all extremities Skin: Skin color, texture, turgor normal, no rashes. 1.5  cm round raised, asymmetrical lesion to anterior proximal shin.  Lymph nodes: Cervical, supraclavicular, and axillary nodes normal Neurologic: CNII-XII intact, normal strength, sensation and gait; reflexes 2+ and symmetric throughout Psych: Normal mood, affect, hygiene and grooming.  ASSESSMENT/PLAN: Medicare annual wellness visit, subsequent  Essential hypertension  Obesity (BMI 30-39.9) - Plan: TSH, Lipid panel  Skin lesion of right leg - Plan: Ambulatory referral to Dermatology  Need for prophylactic vaccination against Streptococcus pneumoniae (pneumococcus)  Screening for lipid disorders - Plan: Lipid panel  Screening for breast cancer - Plan: MM DIGITAL SCREENING BILATERAL  Special screening for malignant neoplasms, colon - Plan: Ambulatory referral to Gastroenterology  Estrogen deficiency - Plan: DG Bone Density  Discussed monthly self breast exams and bi-annually mammograms; at least 30 minutes of aerobic activity at least 5 days/week and weight-bearing exercise 2x/week; proper sunscreen use reviewed; healthy diet, including goals of calcium and vitamin D intake and alcohol recommendations (less than or equal to 1 drink/day) reviewed; regular seatbelt use; changing batteries in smoke detectors.  Immunization recommendations discussed.  Colonoscopy recommendations reviewed Blood pressure is well controlled on medication. Continue on current treatment.  Discussed healthy diet and exercise for weight loss.  She has never had a colonoscopy and referral made. No history of bone density, order in the computer.  Recent changes to skin lesion on RLE is suspicious and plan to refer to dermatologist.  Will follow up pending labs or in 6 months.    Medicare Attestation I have personally reviewed: The patient's medical and social history Their use of alcohol, tobacco or illicit drugs Their current medications and supplements The patient's functional ability including ADLs,fall risks, home safety risks, cognitive, and hearing and visual impairment Diet and physical activities Evidence for depression or mood disorders  The patient's weight, height, and BMI have been recorded in the chart.  I have made referrals, counseling, and provided education to the patient based on review of the above and I have provided the patient with a written personalized care plan for preventive services.     Harland Dingwall, NP   06/14/2016

## 2016-06-14 NOTE — Patient Instructions (Addendum)
MEDICARE PREVENTATIVE SERVICES (FEMALE) AND PERSONALIZED PLAN for Julie Orozco June 14, 2016  CONDITIONS OR RISKS IDENTIFIED TODAY: I recommend you have a mammogram to screen for breast cancer and a colonoscopy to screen for colon cancer.  I am referring you to the dermatologist for further evaluation for the skin lesion on your right lower leg.  I am also ordering a bone density test since you have never had this and screening for osteoporosis. You will have this done hopefully the same day at the [facility]. When you call to schedule your mammogram please let them know that you would also like to get this test.   As discussed, consider filling out the advance directives as you are comfortable doing so.   SPECIFIC RECOMMENDATIONS: Continuing taking your blood pressure medication. It is well controlled today.  Eat a healthy diet and start walking or doing some form of exercise. A small amount of weight loss would help your overall health.   Influenza vaccine: up to date Pneumococcal vaccine: Prevnar 13 given today. You will need a second shot next year.  Shingles vaccine: never  Tdap vaccine: 4 years ago Colonoscopy: never, referral made today Mammogram: 2014, future order in chart Pelvic exam: 2015 Pap smear: 2015- hysterectomy   Aspirin 81mg  nightly for heart health Get at least 150 minutes of exercise. Start eating a healthy diet and limit sugar, carbohydrates. Eat plenty of fruits, vegetables.   Return in 6 months pending labs.    GENERAL RECOMMENDATIONS FOR GOOD HEALTH:  Supplements:  . Take a daily baby Aspirin 81mg  at bedtime for heart health unless you have a history of gastrointestinal bleed, allergy to aspirin, or are already taking higher dose Aspirin or other antiplatelet or blood thinner medication.   . Consume 1200 mg of Calcium daily through dietary calcium or supplement if you are female age 32 or older, or men 14 and older.   Men aged 51-70 should  consume 1000 mg of Calcium daily. . Take 600 IU of Vitamin D daily.  Take 800 IU of Calcium daily if you are older than age 74.  . Take a general multivitamin daily.   Healthy diet: Eat a variety of foods, including fruits, vegetables, vegetable protein such as beans, lentils, tofu, and grains, such as rice.  Limit meat or animal protein, but if you eat meat, choose leans cuts such as chicken, fish, or [place].  Drink plenty of water daily.  Decrease saturated fat in the diet, avoid lots of red meat, processed foods, sweets, fast foods, and fried foods.  Limit salt and caffeine intake.  Exercise: Aerobic exercise helps maintain good heart health. Weight bearing exercise helps keep bones and muscles working strong.  We recommend at least 30-40 minutes of exercise most days of the week.   Fall prevention: Falls are the leading cause of injuries, accidents, and accidental deaths in people over the age of 42. Falling is a real threat to your ability to live on your own.  Causes include poor eyesight or poor hearing, illness, poor lighting, throw rugs, clutter in your home, and medication side effects causing dizziness or balance problems.  Such medications can include medications for depression, sleep problems, high blood pressure, diabetes, and heart conditions.   PREVENTION  Be sure your home is as safe as possible. Here are some tips:  Wear shoes with non-skid soles (not house slippers).   Be sure your home and outside area are well lit.   Use night lights  throughout your house, including hallways and stairways.   Remove clutter and clean up spills on floors and walkways.   Remove throw rugs or fasten them to the floor with carpet tape. Tack down carpet edges.   Do not place electrical cords across pathways.   Install grab bars in your bathtub, shower, and toilet area. Towel bars should not be used as a grab bar.   Install handrails on both sides of stairways.   Do not climb on stools or  stepladders. Get someone else to help with jobs that require climbing.   Do not wax your floors at all, or use a non-skid wax.   Repair uneven or unsafe sidewalks, walkways or stairs.   Keep frequently used items within reach.   Be aware of pets so you do not trip.  Get regular check-ups from your doctor, and take good care of yourself:  Have your eyes checked every year for vision changes, cataracts, glaucoma, and other eye problems. Wear eyeglasses as directed.   Have your hearing checked every 2 years, or anytime you or others think that you cannot hear well. Use hearing aids as directed.   See your caregiver if you have foot pain or corns. Sore feet can contribute to falls.   Let your caregiver know if a medicine is making you feel dizzy or making you lose your balance.   Use a cane, walker, or wheelchair as directed. Use walker or wheelchair brakes when getting in and out.   When you get up from bed, sit on the side of the bed for 1 to 2 minutes before you stand up. This will give your blood pressure time to adjust, and you will feel less dizzy.   If you need to go to the bathroom often, consider using a bedside commode.  Disease prevention:  If you smoke or chew tobacco, find out from your caregiver how to quit. It can literally save your life, no matter how long you have been a tobacco user. If you do not use tobacco, never begin. Medicare does cover some smoking cessation counseling.  Maintain a healthy diet and normal weight. Increased weight leads to problems with blood pressure and diabetes. We check your height, weight, and BMI as part of your yearly visit.  The Body Mass Index or BMI is a way of measuring how much of your body is fat. Having a BMI above 27 increases the risk of heart disease, diabetes, hypertension, stroke and other problems related to obesity. Your caregiver can help determine your BMI and based on it develop an exercise and dietary program to help you  achieve or maintain this important measurement at a healthful level.  High blood pressure causes heart and blood vessel problems.  Persistent high blood pressure should be treated with medicine if weight loss and exercise do not work.  We check your blood pressure as part of your yearly visit.  Avoid drinking alcohol in excess (more than two drinks per day).  Avoid use of street drugs. Do not share needles with anyone. Ask for professional help if you need assistance or instructions on stopping the use of alcohol, cigarettes, and/or drugs.  Brush your teeth twice a day with fluoride toothpaste, and floss once a day. Good oral hygiene prevents tooth decay and gum disease. The problems can be painful, unattractive, and can cause other health problems. Visit your dentist for a routine oral and dental checkup and preventive care every 6-12 months.   See your  eye doctor yearly for routine screening for things like glaucoma.  Look at your skin regularly.  Use a mirror to look at your back. Notify your caregivers of changes in moles, especially if there are changes in shapes, colors, a size larger than a pencil eraser, an irregular border, or development of new moles.  Safety:  Use seatbelts 100% of the time, whether driving or as a passenger.  Use safety devices such as hearing protection if you work in environments with loud noise or significant background noise.  Use safety glasses when doing any work that could send debris in to the eyes.  Use a helmet if you ride a bike or motorcycle.  Use appropriate safety gear for contact sports.  Talk to your caregiver about gun safety.  Use sunscreen with a SPF (or skin protection factor) of 15 or greater.  Lighter skinned people are at a greater risk of skin cancer. Don't forget to also wear sunglasses in order to protect your eyes from too much damaging sunlight. Damaging sunlight can accelerate cataract formation.   If you have multiple sexual partners, or if  you are not in a monogamous relationship, practice safe sex. Use condoms. Condoms are used to help reduce the spread of sexually transmitted infections (or STIs).  Consider an HIV test if you have never been tested.  Consider routine screening for STIs if you have multiple sexual partners.   Keep carbon monoxide and smoke detectors in your home functioning at all times. Change the batteries every 6 months or use a model that plugs into the wall or is hard wired in.   END OF LIFE PLANNING/ADVANCED DIRECTIVES Advance health-care planning is deciding the kind of care you want at the end of life. While alert competent adults are able to exercise their rights to make health care and financial decisions, problems arise when an individual becomes unconscious, incapacitated, or otherwise unable to communicate or make such decisions. Advance health care directives are the legal documents in which you give written instructions about your choices limited, aggressive or palliative care if, in the future, you cannot speak for yourself.  Advanced directives include the following: [place] allows you to appoint someone to act as your health care agent to make health care decisions for you should it be determined by your health care provider that you are no longer able to make these decisions for yourself.  A Living Will is a legal document in which you can declare that under certain conditions you desire your life not be prolonged by extraordinary or artificial means during your last illness or when you are near death. We can provide you with sample advanced directives, you can get an attorney to prepare these for you, or you can visit [place] Secretary of State's website for additional information and resources at [email or web address]  Further, I recommend you have an attorney prepare a Will and Durable Power of Attorney if you haven't done so already.  Please get [place] a copy of your  health care Advanced Directives.   PREVENTATIV E CARE RECOMMENDATIONS:  Vaccinations: We recommend the following vaccinations as part of your preventative care:  Pneumococcal vaccine is recommended to protect against certain types of pneumonia.  This is normally recommended for adults age 39 or older once, or up to every 5 years for those at high risk.  The vaccine is also recommended for adults younger than 68 years old with certain underlying conditions that make them high  risk for pneumonia.  Influenza vaccine is recommended to protect against seasonal influenza or "the flu." Influenza is a serious disease that can lead to hospitalization and sometimes even death. Traditional flu vaccines (called trivalent vaccines) are made to protect against three flu viruses; an influenza A (H1N1) virus, an influenza A (H3N2) virus, and an influenza B virus. In addition, there are flu vaccines made to protect against four flu viruses (called "quadrivalent" vaccines). These vaccines protect against the same viruses as the trivalent vaccine and an additional B virus.  We recommend the high dose influenza vaccine to those 65 years and older.  Hepatitis B vaccine to protect against a form of infection of the liver by a virus acquired from blood or body fluids, particularly for high risk groups.  Td or Tdap vaccine to protect against Tetanus, diphtheria and pertussis which can be very serious.  These diseases are caused by bacteria.  Diphtheria and pertussis are spread from person to person through coughing or sneezing.  Tetanus enters the body through cuts, scratches, or wounds.  Tetanus (Lockjaw) causes painful muscle tightening and stiffness, usually all over the body.  Diphtheria can cause a thick coating to form in the back of the throat.  It can lead to breathing problems, paralysis, heart failure, and death.  Pertussis (Whooping Cough) causes severe coughing spells, which can cause difficulty breathing,  vomiting and disturbed sleep.  Td or Tdap is usually given every 10 years.  Shingles vaccine to protect against Varicella Zoster if you are older than age 13, or younger than 68 years old with certain underlying illness.    Cancer Screening: Most routine colon cancer screening begins at the age of 52.  Subsequent colonoscopies are performed either every 5-10 years for normal screening, or every 2-5 years for higher risks patients, up until age 66 years of age. Annual screening is done with easy to use take-home tests to check for hidden blood in the stool called hemoccult tests.  Sigmoidoscopy or colonoscopy can detect the earliest forms of colon cancer and is life saving. These tests use a small camera at the end of a tube to directly examine the colon.   Pelvic Exam and Pap Smear: Pelvic exams and pap smears are performed routinely to evaluate for abnormalities as well as cancers including cervical and vaginal cancers.  This is generally performed every 2-3 years for most women, or more frequently for higher risk patients.  Mammograms: Mammograms are used to screen for breast cancer.  Medicare covers baseline screening once from ages 42-39 years old, but will cover mammograms yearly for those 40 years and older.  In accordance with other guidelines, you may not need a mammogram every year though.  The decision on how frequently you need a mammogram should be discussed with you medical provider.    Osteoporosis Screening: Screening for osteoporosis usually begins at age 38 for women, and can be done as frequent as every 2 years.  However, women or men with higher risk of osteoporosis may be screened earlier than age 16.  Osteoporosis or low bone mass is diminished bone strength from alterations in bone architecture leading to bone fragility and increased fracture risk.     Cardiovascular Screening: Fat and cholesterol leaves deposits in your arteries that can block them. This causes heart disease  and vessel disease elsewhere in your body.  If your cholesterol is found to be high, or if you have heart disease or certain other medical conditions, then you may  need to have your cholesterol monitored frequently and be treated with medication. Cardiovascular screening in the form of lab tests for cholesterol, HDL and triglycerides can be done every 5 years.  A screening electrocardiogram can be done as part of the Welcome to Medicare physical.  Diabetes Screening: Diabetes screening can be done at least every 3 years for those with risk factors,  or every 6-16months for prediabetic patients.  Screening includes fasting blood sugar test or glucose tolerance test.  Risk factors include hypertension, dyslipidemia, obesity, previously abnormal glucose tests, family history of diabetes, age 25 years or older, and history of gestations diabetes.   AAA (abdominal aortic aneurysm) Screening: Medicare allows for a one time ultrasound to screen for abdominal aortic aneurysm if done as a referral as part of the Welcome to Medicare exam.  Men eligible for this screening include those men between age 98-34 years of age who have smoked at least 100 cigarettes in his lifetime and/or has a family history of AAA.  HIV Screening:  Medicare allows for yearly screening for patients at high risk for contracting HIV disease.

## 2016-06-15 ENCOUNTER — Encounter: Payer: Self-pay | Admitting: Internal Medicine

## 2016-06-15 LAB — LIPID PANEL
Cholesterol: 171 mg/dL (ref 125–200)
HDL: 47 mg/dL (ref >=46)
LDL CALC: 102 mg/dL (ref <130)
Total CHOL/HDL Ratio: 3.6 Ratio (ref <=5.0)
Triglycerides: 112 mg/dL (ref <150)
VLDL: 22 mg/dL (ref <30)

## 2016-06-24 DIAGNOSIS — C44722 Squamous cell carcinoma of skin of right lower limb, including hip: Secondary | ICD-10-CM | POA: Diagnosis not present

## 2016-07-01 DIAGNOSIS — D0471 Carcinoma in situ of skin of right lower limb, including hip: Secondary | ICD-10-CM | POA: Diagnosis not present

## 2016-07-28 ENCOUNTER — Encounter: Payer: Self-pay | Admitting: Gastroenterology

## 2016-08-10 ENCOUNTER — Encounter: Payer: Self-pay | Admitting: Family Medicine

## 2016-08-12 DIAGNOSIS — Z85828 Personal history of other malignant neoplasm of skin: Secondary | ICD-10-CM | POA: Diagnosis not present

## 2016-08-12 DIAGNOSIS — L905 Scar conditions and fibrosis of skin: Secondary | ICD-10-CM | POA: Diagnosis not present

## 2016-09-14 ENCOUNTER — Ambulatory Visit
Admission: RE | Admit: 2016-09-14 | Discharge: 2016-09-14 | Disposition: A | Discharge status: Home / Self Care | Payer: Medicare Other | Source: Ambulatory Visit | Attending: Family Medicine | Admitting: Family Medicine

## 2016-09-14 DIAGNOSIS — Z1231 Encounter for screening mammogram for malignant neoplasm of breast: Secondary | ICD-10-CM | POA: Diagnosis not present

## 2016-09-14 DIAGNOSIS — M8589 Other specified disorders of bone density and structure, multiple sites: Secondary | ICD-10-CM | POA: Diagnosis not present

## 2016-09-14 DIAGNOSIS — E2839 Other primary ovarian failure: Secondary | ICD-10-CM

## 2016-09-14 DIAGNOSIS — Z780 Asymptomatic menopausal state: Secondary | ICD-10-CM | POA: Diagnosis not present

## 2016-09-14 DIAGNOSIS — Z1239 Encounter for other screening for malignant neoplasm of breast: Secondary | ICD-10-CM

## 2016-09-20 ENCOUNTER — Encounter: Payer: Self-pay | Admitting: Family Medicine

## 2016-09-20 ENCOUNTER — Ambulatory Visit (INDEPENDENT_AMBULATORY_CARE_PROVIDER_SITE_OTHER): Payer: Medicare Other | Admitting: Family Medicine

## 2016-09-20 VITALS — BP 124/80 | HR 62 | Wt 180.0 lb

## 2016-09-20 DIAGNOSIS — M8580 Other specified disorders of bone density and structure, unspecified site: Secondary | ICD-10-CM

## 2016-09-20 DIAGNOSIS — E2839 Other primary ovarian failure: Secondary | ICD-10-CM

## 2016-09-20 NOTE — Patient Instructions (Addendum)
Calcium; Vitamin D oral tablets Qu es este medicamento? [place]; VITAMINA D es un suplemento de vitamina. Se utiliza para prevenir condiciones de niveles bajos de calcio y vitamina D. Este medicamento puede ser utilizado para otros usos; si tiene alguna pregunta consulte con su proveedor de atencin mdica o con su farmacutico. MARCAS COMUNES: Calcarb 600 with Vitamin D, Calcet Plus Vitamin D, Calcitrate + D, Calcium Citrate + D3 Maximum, Calcium Citrate Maximum with D, Caltrate, Caltrate 600+D, Citracal + D, Citracal MAXIMUM + D, Citracal Petites with Vitamin D, Citrus Calcium Plus D, OSCAL 500 + D, OSCAL Calcium + D3, OSCAL Extra D3, Osteo-Poretical, Oysco 500 + D, Oysco D, Oystercal-D Calcium Qu le debo informar a mi profesional de la salud antes de tomar este medicamento? Necesita saber si usted presenta alguno de los siguientes problemas o situaciones: -estreimiento -deshidratacin -enfermedad cardiaca -niveles altos de calcio o vitamina D en la sangre -niveles altos de fosfato en la sangre -enfermedad renal -enfermedad heptica -enfermedad paratiroidea -sarcoidosis -lcera u obstruccin estomacal -una reaccin alrgica o inusual al calcio, a la vitamina D, al colorante tartrazina, a otros medicamentos, alimentos, colorantes o conservantes -si est embarazada o buscando quedar embarazada -si est amamantando a un beb Cmo debo utilizar este medicamento? Tome este medicamento por va oral con un vaso de agua. Siga las instrucciones de la etiqueta del [place]. Tmelo con alimentos o dentro de 1 hora despus de una comida. Tome sus dosis a intervalos regulares. No tome su medicamento con una frecuencia mayor a la indicada. Hable con su pediatra para informarse acerca del uso de este medicamento en nios. Aunque este medicamento se puede recetar para condiciones selectivas, las precauciones se aplican. Sobredosis: Pngase en contacto inmediatamente con un centro toxicolgico o  una sala de urgencia si usted cree que haya tomado demasiado medicamento. ATENCIN: [employer] es solo para usted. No comparta este medicamento con nadie. Qu sucede si me olvido de una dosis? Si olvida una dosis, tmela lo antes posible. Si es casi la hora de la prxima dosis, tome slo esa dosis. No tome dosis adicionales o dobles. Qu puede interactuar con este medicamento? No tome esta medicina con ninguno de los siguientes medicamentos: -cloruro de amonio -metenamina Esta medicina tambin puede Counselling psychologist con los siguientes medicamentos: -antibiticos, tales como ciprofloxacina, gatifloxacino, tetraciclina -captopril -delarvidina -diurticos -gabapentina -suplementos de hierro -medicamentos para infecciones micticas, tales como quetoconazol e itraconazol -medicamentos para convulsiones, tales como etotona y fenitona -aceite mineral -micofenolato -otras vitaminas con calcio, vitamina D o minerales -quinidina -rosuvastatina -sucralfato -medicamentos tiroideos Puede ser que esta lista no menciona todas las posibles interacciones. Informe a su profesional de [employer] Home	[place] de [employer] Corporation productos a base de hierbas, medicamentos de [place] o suplementos nutritivos que est tomando. Si usted fuma, consume bebidas alcohlicas o si utiliza drogas ilegales, indqueselo tambin a su profesional de KB Home	[place]. Algunas sustancias pueden interactuar con su medicamento. A qu debo estar atento al usar Coca[employer]a? El tomar este medicamento no es un sustituto para Ardelia Mems dieta equilibrada y ejercicios. Consulte a su mdico o su proveedor de atencin mdica y sigue un estilo de vida saludable. No tome este medicamento con alimentos con alto contenido de [place], cantidades grande de alcohol o bebidas con cafena. No tome este medicamento dentro de 2 horas de tomar cualquier otro medicamento. Qu efectos secundarios puedo tener al [employer] Corporation este medicamento? Efectos secundarios que debe  informar a su mdico o a Barrister's clerk de la salud tan pronto como sea posible: -  reacciones alrgicas como erupcin cutnea, picazn o urticarias, hinchazn de la cara, labios o lengua -confusin -boca seca -alta presin sangunea -aumento de apetito o sed -aumento de orina -pulso cardiaco irregular -sabor metlico -dolor de hueso o msculo -dolor al Garment/textile technologist -convulsiones -cansancio o debilidad inusual -prdida de peso Efectos secundarios que, por lo general, no requieren atencin mdica (debe informarlos a su mdico o a su profesional de la salud si persisten o si son molestos): -estreimiento -diarrea -dolor de cabeza -prdida del apetito -nuseas, vmito -Higher education careers adviser Puede ser que esta lista no menciona todos los posibles efectos secundarios. Comunquese a su mdico por asesoramiento mdico Humana Inc. Usted puede informar los efectos secundarios a la FDA por telfono al 1-800-FDA-1088. Dnde debo guardar mi medicina? Mantngala fuera del alcance de los nios. Gurdela a [employer], entre 15 y 73 grados C (6 y 40 grados F). Protjala de la luz. Mantenga el envase bien cerrado. Deseche todo el medicamento que no haya utilizado, despus de la fecha de vencimiento. ATENCIN: Este folleto es un resumen. Puede ser que no cubra toda la posible informacin. Si usted tiene preguntas acerca de esta medicina, consulte con su mdico, su farmacutico o su profesional de Technical sales engineer.  2017 Elsevier/Gold Standard (2014-10-09 00:00:00)    Recomendaciones para el consumo de calcio (Calcium Intake Recommendations) El calcio es un mineral que afecta muchas funciones del cuerpo, entre ellas:  Coagulacin de Herbalist.  Funcionamiento de los vasos sanguneos.  Conduccin de impulsos nerviosos.  Secrecin hormonal.  Contraccin muscular.  Funciones relacionadas con los huesos y los dientes. La mayor parte del suministro de calcio se almacena en los  huesos y los dientes. Cuando los depsitos de calcio estn bajos, se puede correr riesgo de tener menos masa sea, prdida sea y fracturas. El consumo de la cantidad suficiente de calcio ayuda a que los [place] y los dientes estn sanos, y a Production assistant, radio se rompan con el transcurso del [place]. Es muy importante que las siguientes personas reciban la cantidad suficiente de calcio:  Los nios, que estn creciendo rpidamente.  Las adolescentes.  Las mujeres pre o posmenopusicas.  Las mujeres cuyo ciclo menstrual se detuvo debido a anorexia nerviosa o a la prctica regular de ejercicios intensos.  Las personas con intolerancia a la lactosa o Buyer, retail a Interior and spatial designer.  Los vegetarianos. EN QU CONSISTE EL PLAN? Intente consumir la cantidad de calcio diario recomendado para su edad. Segn su estado de salud general, el mdico puede recomendarle que aumente el consumo de calcio. Las recomendaciones generales de consumo de calcio diario por edad son las siguientes:  Desde el nacimiento hasta los 27mses: 2022m  Bebs de 7a 1251ms: 260m86mNios de 1a 3aos: 700mg27mios de 4a 8aos: 1000mg.43mos de 9a 13aos: 1300mg. 40mlescentes de 14a 18aos: 1300mg.  57mtos de 19a 50aos: 1000mg.  M5mes adultas de 51a 70aos: 1200mg.  Ho67ms adultos de 51a 70aos: 1000mg.  Adu74m de 71aos en adelante: 1200mg.  Adol76mntes embarazadas y que amamantan: 1300mg.  Adult63mmbarazadas y que amamantan: 1000mg. QU DEBO59mER ACERCA DEL CONSUMO DE CALCIO?  Para que el cuerpo pueda absorber el calcio, necesita vitaminaD. La vitaminaD se obtiene de las siguientes fuentes:  Exposicin directa de la piel a la luz solar. Administratorcomo yema de huevo, hgado, [place]alitydo[place]off[place] salada y leche fortificTowamensing Trails Suplementos.  El consumo de una cantidad excesiva de calcio puede causar lo siguiente:  Estreimiento.  Menor absorcin de hierro  y zinc.  Clculos en  el rin.  Los suplementos de calcio pueden interactuar con determinados medicamentos. Consulte al mdico antes de empezar a tomar cualquier suplemento de calcio.  Intente obtener la mayor parte del calcio a travs de los alimentos. QU ALIMENTOS PUEDO COMER?  Cereales  Avena fortificada. Cereales fortificados listos para comer. Waffles congelados fortificados. Verduras  Nabos. Brcoli. Lambert Mody  Jugo de naranja fortificado. Carnes y otras fuentes de protenas  [place]ardinas enlatadas con espinas. Salmn enlatado con espinas. Porotos de soja. Tofu. Frijoles horneados. Almendras. [place]. Semillas de girasol. Lcteos  Leche. Yogur. Queso. Time Warner. Bebidas  Leche de soja fortificada. Leche de arroz fortificada. Dulces/postres  Pudin. Helados. Batidos con [employer] Utilities. Melaza negra. Los artculos mencionados arriba pueden no ser Dean Foods Company de las bebidas o los alimentos recomendados. Comunquese con el nutricionista para conocer ms opciones.  QU ALIMENTOS PUEDEN AFECTAR EL CONSUMO DE CALCIO? Puede ser ms difcil que el cuerpo utilice el calcio, o el cuerpo puede eliminar el calcio ms rpidamente, si se consumen grandes cantidades de lo siguiente:  Sodio.  Protenas.  Cafena.  Alcohol. Esta informacin no tiene Marine scientist el consejo del mdico. Asegrese de hacerle al mdico cualquier pregunta que tenga. Document Released: 08/02/2012 Document Revised: 09/06/2014 Document Reviewed: 01/22/2014 Elsevier Interactive Patient Education  2017 [place] de los huesos ([facility]) Los huesos protegen a los rganos, Buyer, retail calcio y son un anclaje para los msculos. Los buenos hbitos de salud, como consumir alimentos nutritivos y Field seismologist ejercicio con regularidad, son importantes para Warden/ranger. Esto tambin puede ayudar a Product/process development scientist una enfermedad que hace que los huesos pierdan su densidad, y se vuelvan dbiles y frgiles (osteoporosis). POR  QU ES IMPORTANTE LA MASA SEA? El trmino masa sea hace referencia a la cantidad de tejido seo que tiene Arts administrator. Cuanto ms alto es el valor de la masa sea, ms fuertes son los [place]. Un paso importante en pos de lograr huesos sanos durante toda la vida es tener huesos fuertes y densos durante la niez. Si un adulto joven tiene Special educational needs teacher alto de masa sea, es ms probable que este tambin sea alto en una etapa posterior de su vida. Cuando la masa sea alcanza su valor ms alto recibe el nombre de masa sea pico. En los adultos [place] se produce una gran disminucin de la masa sea. En las mujeres, esto ocurre cerca de la menopausia. Durante esta etapa, es importante tener buenos hbitos de salud, porque si se pierde ms masa sea que la que se repone, se Comptroller salud de los huesos y estos sern ms propensos a quebrarse (fracturarse). Si descubre que el valor de su masa sea [place], tal vez pueda evitar la osteoporosis o la prdida de ms masa sea si cambia la dieta y el estilo de vida. CMO PUEDO SABER SI EL VALOR DE LA MASA SEA ES BAJO? La masa sea se puede medir con un estudio radiogrfico llamado prueba de la densidad mineral sea (DMO). Este estudio se recomienda a todas las Cendant Corporation de 73XTG. Tambin puede recomendarse a los The Progressive Corporation de 68aos o a las personas con ms probabilidades de tener osteoporosis debido a lo siguiente:  Insurance account manager que se fracturan con facilidad.  Tener una enfermedad a largo plazo que debilita los [place], como enfermedad renal o artritis reumatoide.  Tener la menopausia antes de lo normal.  Tomar medicamentos que Bear Stearns, como corticoides, hormonas tiroideas o Clinical research associate  hormonal para el cncer de mama o de prstata.  Fumar.  Consumir tres o ms bebidas alcohlicas al SunTrust. [place]? Para tener huesos sanos, es necesario consumir la cantidad suficiente  de minerales y vitaminas correctos. La mayora de los expertos en nutricin recomiendan obtener estos nutrientes de los alimentos que se consumen. Las recomendaciones nutricionales varan de Ardelia Mems persona a Theatre manager. Pregntele al mdico qu es sano para usted. A continuacin se incluyen algunas pautas generales. Recomendaciones respecto del calcio  El calcio es el mineral ms importante (fundamental) para la salud de [place]. La State Farm de las personas pueden obtener la cantidad suficiente de calcio de la dieta, Armed forces training and education officer se pueden recomendar suplementos para las personas que corren riesgo de tener osteoporosis. Entre las fuentes adecuadas de [place], se incluyen:  Los productos lcteos, [place], [place] y yogur con bajo contenido de [place] o sin grasa.  Las verduras de hojas color verde oscuro, como col [place] y brcoli.  Los alimentos fortificados con calcio, como jugo de [place], cereales, panes, bebidas a base de soja y productos con tofu.  Los frutos secos, como las [place]. Siga estos consejos [employer]gate[employer]Palmolive cantidades recomendadas para la ingesta diaria de calcio:  Nios de 1 a 3aos: '700mg'$ .  Nios de 4 a 8aos: '1000mg'$ .  Nios de 9 a 13aos: '1300mg'$ .  Adolescentes de 14 a 18aos: '1300mg'$ .  Adultos de 19 a 50aos: '1000mg'$ .  Adultos de 51 a 70aos:  Hombres: '1000mg'$ .  Mujeres: '1200mg'$ .  Adultos mayores de 71aos: '1200mg'$ .  Embarazadas y mujeres que amamantan:  Adolescentes: '1300mg'$ .  Adultos: '1000mg'$ . Recomendaciones respecto de la vitaminaD  La vitaminaD es la vitamina ms indispensable para la salud de [employer] Corporation. Ayuda al organismo a absorber el calcio. La luz del sol estimula la piel para que produzca la vitaminaD; por lo tanto, asegrese de recibir suficiente luz solar. Si vive en un lugar donde el clima es fro o no est al aire libre con frecuencia, el mdico puede recomendarle que tome suplementos de vitaminaD. Las buenas fuentes de vitaminaD en la dieta incluyen  lo siguiente:  Yemas de huevo.  Pescados de Yahoo[employer] Inc.  Leche y cereales fortificados con vitaminaD. Siga estos consejos Colgate[employer]Palmolive cantidades recomendadas para la ingesta diaria de vitaminaD:  Nios y adolescentes de 1 a 18aos: 600unidades internacionales.  Adultos menores de 50aos: 400 a 800unidades internacionales.  Adultos mayores de 51aos: 800 a 1000unidades internacionales. Otros nutrientes  Otros nutrientes para la salud de los huesos Verizon siguientes:  Fsforo. Este mineral se encuentra en la carne, la carne de ave, los productos lcteos, los frutos secos y las legumbres. La ingesta diaria recomendada para las mujeres y los hombres adultos es '700mg'$ .  Magnesio. Este mineral se encuentra en las semillas, los frutos secos, las verduras de hojas de color verde oscuro y las legumbres. La ingesta diaria recomendada para los hombres adultos es de 400 a '420mg'$ . Para las mujeres [place], de 310 a '320mg'$ .  VitaminaK. Esta vitamina se encuentra en las verduras de hojas verdes. La ingesta diaria recomendada es '120mg'$  para los hombres adultos y '90mg'$  para las mujeres adultas. QU TIPO DE ACTIVIDAD FSICA ES LA MS ADECUADA PARA LA FORMACIN Y EL MANTENIMIENTO DE HUESOS SANOS? Las actividades relacionadas con el soporte de cargas y el fortalecimiento son importantes para la formacin y Designer, jewellery de la masa sea pico. Las actividades con soporte de Lawyer que los msculos y los huesos trabajen contra la  gravedad. Las actividades de fortalecimiento aumentan la fuerza muscular que da soporte a los [place]. Las actividades con soporte de cargas y para el aumento de la masa muscular incluyen lo siguiente:  Writer y Field seismologist senderismo.  Trotar y Optometrist.  Bailar.  Hacer ejercicios de gimnasia.  Levantamiento de pesas.  Jugar tenis y raquetbol.  Subir escaleras.  Hacer ejercicios aerbicos. Las Insurance account manager por lo menos 2mnutos de  aSamoafsica moderada la mHartford Financial Los nios deben hacer por lo menos 674mutos de acSamoasica moderada la maHartford FinancialPregntele al mdico qu tipo de ejercicio es el ms adecuado para usted. DNDE PUDolan AmenS INFORMACIN? Para obtener ms informacin, visite los siguientes sitios web:  FuCendant Corporatione Osteoporosis (NaCarmen [email or web address] Esta informacin no tiene como fin reemplazar el consejo del mdico. Asegrese de hacerle al mdico cualquier pregunta que tenga. Document Released: 06/06/2013 Document Revised: 12/31/2014 Document Reviewed: 08/21/2014 Elsevier Interactive Patient Education  2017 [employer]ynolds American

## 2016-09-20 NOTE — Progress Notes (Signed)
   Subjective:    Patient ID: Julie Orozco, female    DOB: 1945/09/22, 71 y.o.   MRN: [id number]  HPI Chief Complaint  Patient presents with  . follow-up    follow-up on dexa   She is here with an interpreter for follow up on recent abnormal bone density results.  Per XR, she is now osteopenic. She denies history of this.  States she is not taking a multi-vitamin or vitamin D.   Denies history of fractures.  Drinks 1 cup of coffee per day.   Diet is low in calcium.  States she does not exercise.  No known family history of osteoporosis.   No concerns or complaints today.   Reviewed allergies, medications, past medical, surgical, family, and social history.   Results of bone density below: ASSESSMENT: The BMD measured at AP Spine L2-L4 is 0.932 g/cm2 with a T-score of -2.3. This patient is considered osteopenic according to [facility]) criteria. L-1 was excluded due to degenerative changes.   Review of Systems Pertinent positives and negatives in the history of present illness.     Objective:   Physical Exam BP 124/80   Pulse 62   Wt 180 lb (81.6 kg)   BMI 34.29 kg/m  Alert and oriented and in no acute distress. Not otherwise examined.       Assessment & Plan:  Osteopenia determined by x-ray - Plan: VITAMIN D 25 Hydroxy (Vit-D Deficiency, Fractures)  Estrogen deficiency - Plan: VITAMIN D 25 Hydroxy (Vit-D Deficiency, Fractures)  Discussed management and prevention of further bone loss.  Her diet appears to be low in calcium. Discussed consuming at least 1200 mg of calcium from all sources including diet and/or supplement, vitamin D 800mg  and weight bearing exercises.  She denies history of alcohol use or smoking.  Checking vitamin D level and will treat as appropriate.  Follow up pending labs.

## 2016-09-21 LAB — VITAMIN D 25 HYDROXY (VIT D DEFICIENCY, FRACTURES): Vit D, 25-Hydroxy: 22 ng/mL — ABNORMAL LOW (ref 30–100)

## 2016-11-10 DIAGNOSIS — Z85828 Personal history of other malignant neoplasm of skin: Secondary | ICD-10-CM | POA: Diagnosis not present

## 2016-11-10 DIAGNOSIS — L918 Other hypertrophic disorders of the skin: Secondary | ICD-10-CM | POA: Diagnosis not present

## 2016-11-10 DIAGNOSIS — D225 Melanocytic nevi of trunk: Secondary | ICD-10-CM | POA: Diagnosis not present

## 2016-11-10 DIAGNOSIS — L821 Other seborrheic keratosis: Secondary | ICD-10-CM | POA: Diagnosis not present

## 2016-11-17 ENCOUNTER — Encounter: Payer: Self-pay | Admitting: Family Medicine

## 2016-11-17 DIAGNOSIS — E559 Vitamin D deficiency, unspecified: Secondary | ICD-10-CM | POA: Insufficient documentation

## 2017-04-11 ENCOUNTER — Telehealth: Payer: Self-pay | Admitting: Family Medicine

## 2017-04-11 MED ORDER — LISINOPRIL 10 MG PO TABS: 10.0000 mg | ORAL_TABLET | Freq: Every day | ORAL | 2 refills | Status: DC

## 2017-04-11 NOTE — Telephone Encounter (Signed)
Refilled until October for when she is due again for Cpe

## 2017-04-11 NOTE — Telephone Encounter (Signed)
Rcvd refill request for Lisinoprl 10 mg #90

## 2017-05-25 ENCOUNTER — Ambulatory Visit (INDEPENDENT_AMBULATORY_CARE_PROVIDER_SITE_OTHER): Payer: Medicare Other | Admitting: Family Medicine

## 2017-05-25 ENCOUNTER — Encounter: Payer: Self-pay | Admitting: Family Medicine

## 2017-05-25 VITALS — BP 124/82 | HR 56 | Wt 181.4 lb

## 2017-05-25 DIAGNOSIS — I10 Essential (primary) hypertension: Secondary | ICD-10-CM

## 2017-05-25 DIAGNOSIS — Z23 Encounter for immunization: Secondary | ICD-10-CM | POA: Diagnosis not present

## 2017-05-25 DIAGNOSIS — R5383 Other fatigue: Secondary | ICD-10-CM | POA: Diagnosis not present

## 2017-05-25 DIAGNOSIS — E559 Vitamin D deficiency, unspecified: Secondary | ICD-10-CM | POA: Diagnosis not present

## 2017-05-25 MED ORDER — LISINOPRIL 10 MG PO TABS: 10.0000 mg | ORAL_TABLET | Freq: Every day | ORAL | 5 refills | Status: DC

## 2017-05-25 NOTE — Patient Instructions (Signed)
Start taking vitamin D 1,000 IU daily.  Return for labs.

## 2017-05-25 NOTE — Progress Notes (Signed)
   Subjective:    Patient ID: Julie Orozco, female    DOB: 07/28/1946, 71 y.o.   MRN: [id number]  HPI Chief Complaint  Patient presents with  . med check    med check, been feeling tired lately. will get flu shot today   She is here for a med check. Her daughter is here translating.  Does not check her BP at home.  Reports good medication compliance.   Complains of fatigue that has been present for the past 2 months. States she has a history of vitamin D deficiency and has not been taking vitamin D.  Also complains of feeling bloated over the past few days. No abdominal pain, N/V/D.   Denies fever, chills, dizziness, chest pain, palpitations, shortness of breath,  Back pain, urinary symptoms, LE edema.   Reviewed allergies, medications, past medical, surgical,  and social history.   Review of Systems Pertinent positives and negatives in the history of present illness.     Objective:   Physical Exam  Constitutional: She appears well-developed and well-nourished. No distress.  HENT:  Mouth/Throat: Oropharynx is clear and moist.  Eyes: Conjunctivae are normal.  Cardiovascular: Normal rate, regular rhythm and intact distal pulses.  Exam reveals no gallop and no friction rub.   No murmur heard. No LE edema   Pulmonary/Chest: Effort normal and breath sounds normal.  Abdominal: Soft. Normal appearance and bowel sounds are normal. She exhibits no distension. There is no hepatosplenomegaly. There is no tenderness. There is no rigidity, no rebound, no guarding, no CVA tenderness, no tenderness at McBurney's point and negative Murphy's sign.  Skin: Skin is warm and dry. No rash noted. No pallor.  Psychiatric: She has a normal mood and affect. Her speech is normal and behavior is normal. Thought content normal.   BP 124/82   Pulse (!) 56   Wt 181 lb 6.4 oz (82.3 kg)   BMI 34.56 kg/m      Assessment & Plan:  Essential hypertension - Plan: lisinopril (PRINIVIL,ZESTRIL) 10 MG  tablet, CBC with Differential/Platelet, Comprehensive metabolic panel  Needs flu shot - Plan: Flu vaccine HIGH DOSE PF (Fluzone High Dose)  Fatigue, unspecified type - Plan: CBC with Differential/Platelet, Comprehensive metabolic panel, TSH, VITAMIN D 25 Hydroxy (Vit-D Deficiency, Fractures)  Vitamin D deficiency - Plan: VITAMIN D 25 Hydroxy (Vit-D Deficiency, Fractures)  She would like to return later this week for labs.  BP is at goal. Continue on current medication.  Will check labs for fatigue. Discussed possible etiologies.  Follow up pending labs. She may start taking 1,000 IU of vitamin D daily.

## 2017-06-13 ENCOUNTER — Other Ambulatory Visit: Payer: Self-pay | Admitting: Family Medicine

## 2017-06-13 ENCOUNTER — Encounter: Payer: Self-pay | Admitting: Family Medicine

## 2017-06-13 ENCOUNTER — Other Ambulatory Visit (INDEPENDENT_AMBULATORY_CARE_PROVIDER_SITE_OTHER): Payer: Medicare Other

## 2017-06-13 DIAGNOSIS — Z8639 Personal history of other endocrine, nutritional and metabolic disease: Secondary | ICD-10-CM | POA: Insufficient documentation

## 2017-06-13 DIAGNOSIS — E559 Vitamin D deficiency, unspecified: Secondary | ICD-10-CM

## 2017-06-13 DIAGNOSIS — R5383 Other fatigue: Secondary | ICD-10-CM

## 2017-06-13 DIAGNOSIS — I10 Essential (primary) hypertension: Secondary | ICD-10-CM | POA: Diagnosis not present

## 2017-06-14 LAB — CBC WITH DIFFERENTIAL/PLATELET
BASOS PCT: 0.7 %
Basophils Absolute: 48 cells/uL (ref 0–200)
EOS ABS: 186 {cells}/uL (ref 15–500)
Eosinophils Relative: 2.7 %
HCT: 36.6 % (ref 35.0–45.0)
HEMOGLOBIN: 12.7 g/dL (ref 11.7–15.5)
Lymphs Abs: 2346 cells/uL (ref 850–3900)
MCH: 30.8 pg (ref 27.0–33.0)
MCHC: 34.7 g/dL (ref 32.0–36.0)
MCV: 88.8 fL (ref 80.0–100.0)
MPV: 12.6 fL — AB (ref 7.5–12.5)
Monocytes Relative: 6.9 %
NEUTROS ABS: 3843 {cells}/uL (ref 1500–7800)
Neutrophils Relative %: 55.7 %
PLATELETS: 218 10*3/uL (ref 140–400)
RBC: 4.12 10*6/uL (ref 3.80–5.10)
RDW: 12.4 % (ref 11.0–15.0)
TOTAL LYMPHOCYTE: 34 %
WBC: 6.9 10*3/uL (ref 3.8–10.8)
WBCMIX: 476 {cells}/uL (ref 200–950)

## 2017-06-14 LAB — COMPREHENSIVE METABOLIC PANEL
AG RATIO: 1.3 (calc) (ref 1.0–2.5)
ALBUMIN MSPROF: 4 g/dL (ref 3.6–5.1)
ALKALINE PHOSPHATASE (APISO): 90 U/L (ref 33–130)
ALT: 28 U/L (ref 6–29)
AST: 19 U/L (ref 10–35)
BUN/Creatinine Ratio: 34 (calc) — ABNORMAL HIGH (ref 6–22)
BUN: 16 mg/dL (ref 7–25)
CO2: 23 mmol/L (ref 20–32)
CREATININE: 0.47 mg/dL — AB (ref 0.50–0.99)
Calcium: 8.9 mg/dL (ref 8.6–10.4)
Chloride: 107 mmol/L (ref 98–110)
GLOBULIN: 3.2 g/dL (ref 1.9–3.7)
GLUCOSE: 99 mg/dL (ref 65–99)
Potassium: 4.1 mmol/L (ref 3.5–5.3)
Sodium: 140 mmol/L (ref 135–146)
Total Bilirubin: 0.4 mg/dL (ref 0.2–1.2)
Total Protein: 7.2 g/dL (ref 6.1–8.1)

## 2017-06-14 LAB — TSH: TSH: 1.7 m[IU]/L (ref 0.40–4.50)

## 2017-06-14 LAB — VITAMIN D 25 HYDROXY (VIT D DEFICIENCY, FRACTURES): Vit D, 25-Hydroxy: 25 ng/mL — ABNORMAL LOW (ref 30–100)

## 2017-07-12 ENCOUNTER — Ambulatory Visit: Payer: Medicare Other | Admitting: Family Medicine

## 2017-07-13 ENCOUNTER — Other Ambulatory Visit: Payer: Medicare Other

## 2017-07-13 ENCOUNTER — Ambulatory Visit: Payer: Medicare Other | Admitting: Family Medicine

## 2017-07-13 ENCOUNTER — Other Ambulatory Visit: Payer: Self-pay | Admitting: Family Medicine

## 2017-07-13 ENCOUNTER — Telehealth: Payer: Self-pay | Admitting: Internal Medicine

## 2017-07-13 DIAGNOSIS — E559 Vitamin D deficiency, unspecified: Secondary | ICD-10-CM

## 2017-07-13 DIAGNOSIS — N289 Disorder of kidney and ureter, unspecified: Secondary | ICD-10-CM

## 2017-07-13 NOTE — Telephone Encounter (Signed)

## 2017-07-14 LAB — BASIC METABOLIC PANEL WITH GFR
BUN: 14 mg/dL (ref 7–25)
CALCIUM: 9.1 mg/dL (ref 8.6–10.4)
CHLORIDE: 104 mmol/L (ref 98–110)
CO2: 29 mmol/L (ref 20–32)
Creat: 0.63 mg/dL (ref 0.50–0.99)
GFR, Est African American: 106 mL/min/{1.73_m2} (ref >=60)
GFR, Est Non African American: 92 mL/min/{1.73_m2} (ref >=60)
GLUCOSE: 88 mg/dL (ref 65–99)
POTASSIUM: 4.3 mmol/L (ref 3.5–5.3)
SODIUM: 140 mmol/L (ref 135–146)

## 2017-07-14 LAB — VITAMIN D 25 HYDROXY (VIT D DEFICIENCY, FRACTURES): VIT D 25 HYDROXY: 30 ng/mL (ref 30–100)

## 2017-09-22 ENCOUNTER — Other Ambulatory Visit: Payer: Self-pay | Admitting: Family Medicine

## 2017-09-22 DIAGNOSIS — Z1231 Encounter for screening mammogram for malignant neoplasm of breast: Secondary | ICD-10-CM

## 2017-10-14 ENCOUNTER — Ambulatory Visit
Admission: RE | Admit: 2017-10-14 | Discharge: 2017-10-14 | Disposition: A | Discharge status: Home / Self Care | Payer: Medicare Other | Source: Ambulatory Visit | Attending: Family Medicine | Admitting: Family Medicine

## 2017-10-14 DIAGNOSIS — Z1231 Encounter for screening mammogram for malignant neoplasm of breast: Secondary | ICD-10-CM | POA: Diagnosis not present

## 2017-10-31 ENCOUNTER — Telehealth: Payer: Self-pay | Admitting: Internal Medicine

## 2017-10-31 ENCOUNTER — Other Ambulatory Visit: Payer: Self-pay

## 2017-10-31 NOTE — Telephone Encounter (Signed)
Put in notes for Wednesday nurse visit

## 2017-10-31 NOTE — Telephone Encounter (Signed)
This is ok and she will need pneumovax 23. Thanks.

## 2017-10-31 NOTE — Telephone Encounter (Signed)
Looks like pt is on the nurse visit for a pneum shot but you have not ok'ed this. Is this okay for her to come in and what dose of pneum?

## 2017-11-02 ENCOUNTER — Other Ambulatory Visit (INDEPENDENT_AMBULATORY_CARE_PROVIDER_SITE_OTHER): Payer: Medicare Other

## 2017-11-02 DIAGNOSIS — Z23 Encounter for immunization: Secondary | ICD-10-CM | POA: Diagnosis not present

## 2017-11-16 ENCOUNTER — Ambulatory Visit (INDEPENDENT_AMBULATORY_CARE_PROVIDER_SITE_OTHER): Payer: Medicare Other | Admitting: Family Medicine

## 2017-11-16 ENCOUNTER — Encounter: Payer: Self-pay | Admitting: Family Medicine

## 2017-11-16 VITALS — BP 118/72 | HR 67 | Temp 98.5°F | Wt 177.0 lb

## 2017-11-16 DIAGNOSIS — R21 Rash and other nonspecific skin eruption: Secondary | ICD-10-CM

## 2017-11-16 NOTE — Progress Notes (Signed)
   Subjective:    Patient ID: Julie Orozco, female    DOB: [birth year], 72 y.o.   MRN: [id number]  HPI Chief Complaint  Patient presents with  . growth on foot    spot on left side of foot. not painful but sometimes bothersome   Complains of a 1 year history of a dark brown spot on her left foot. States it seems to be getting larger. Denies itching, redness, infection, pain.  Denies fever, chills, N/V.  Reviewed allergies, medications, past medical, surgical, family, and social history.   Review of Systems Pertinent positives and negatives in the history of present illness.     Objective:   Physical Exam  Constitutional: She appears well-developed and well-nourished. No distress.  Musculoskeletal:       Feet:  Round, flat, dark brown lesion without surrounding erythema, induration or sign of infection.    BP 118/72   Pulse 67   Temp 98.5 F (36.9 C) (Oral)   Wt 177 lb (80.3 kg)   SpO2 98%   BMI 33.72 kg/m         Assessment & Plan:  Skin eruption  Discussed possible etiologies such as atypical nevus vs wart. She is traveling to [place] next week for a month. Will have her take a picture of this and follow up with her dermatologist when she returns. She may come in to have the area checked again when she returns if she cannot see her dermatologist at that time.

## 2018-02-14 ENCOUNTER — Telehealth: Payer: Self-pay

## 2018-02-14 NOTE — Telephone Encounter (Signed)
Left message for patient to call back to make an appointment for AWV.

## 2018-03-16 ENCOUNTER — Other Ambulatory Visit: Payer: Self-pay | Admitting: Family Medicine

## 2018-03-16 ENCOUNTER — Telehealth: Payer: Self-pay | Admitting: Family Medicine

## 2018-03-16 NOTE — Telephone Encounter (Signed)
Already done today 

## 2018-03-16 NOTE — Telephone Encounter (Signed)
Pt made a appt for August for wellness visit. Needs refills on bp meds until then. Pt is completely out. Please send to [facility] [address]. Pt can be reached at [phone].

## 2018-03-16 NOTE — Telephone Encounter (Signed)
Pt was notified she needed an appt and she was on the phone already with [place] to schedule

## 2018-03-17 ENCOUNTER — Other Ambulatory Visit: Payer: Self-pay | Admitting: Family Medicine

## 2018-04-17 ENCOUNTER — Ambulatory Visit (INDEPENDENT_AMBULATORY_CARE_PROVIDER_SITE_OTHER): Payer: Medicare Other | Admitting: Family Medicine

## 2018-04-17 ENCOUNTER — Encounter: Payer: Self-pay | Admitting: Family Medicine

## 2018-04-17 VITALS — BP 130/90 | HR 59 | Ht 60.0 in | Wt 177.6 lb

## 2018-04-17 DIAGNOSIS — E559 Vitamin D deficiency, unspecified: Secondary | ICD-10-CM

## 2018-04-17 DIAGNOSIS — Z13220 Encounter for screening for lipoid disorders: Secondary | ICD-10-CM

## 2018-04-17 DIAGNOSIS — Z1211 Encounter for screening for malignant neoplasm of colon: Secondary | ICD-10-CM | POA: Diagnosis not present

## 2018-04-17 DIAGNOSIS — M8580 Other specified disorders of bone density and structure, unspecified site: Secondary | ICD-10-CM | POA: Diagnosis not present

## 2018-04-17 DIAGNOSIS — I10 Essential (primary) hypertension: Secondary | ICD-10-CM | POA: Diagnosis not present

## 2018-04-17 DIAGNOSIS — Z79899 Other long term (current) drug therapy: Secondary | ICD-10-CM | POA: Diagnosis not present

## 2018-04-17 DIAGNOSIS — E669 Obesity, unspecified: Secondary | ICD-10-CM | POA: Diagnosis not present

## 2018-04-17 DIAGNOSIS — R5383 Other fatigue: Secondary | ICD-10-CM | POA: Diagnosis not present

## 2018-04-17 DIAGNOSIS — Z0000 Encounter for general adult medical examination without abnormal findings: Secondary | ICD-10-CM

## 2018-04-17 NOTE — Patient Instructions (Addendum)
You can call [facility]   Call and check with insurance about the shingles vaccine Shingrix.    Cut back on portion sizes, eat less salt and fatty foods.  Increase your physical activity.   Return in 6 months for a follow up visit.

## 2018-04-17 NOTE — Progress Notes (Signed)
Julie Orozco is a 72 y.o. female who presents for annual wellness visit and follow-up on chronic medical conditions.  She has the following concerns:  HTN: BP at home has been good. No side effects from medication.   Osteopenia- trying to get calcium in diet. Is not exercising. No fractures or falls.  Takes 1,000 IU of vitamin D most days.   Obesity- has not tried to lose weight but is aware that she should   Reports mild fatigue. Does not do much at home. Watches TV and sits mostly. Sleeping ok. Denies depression.    Immunization History  Administered Date(s) Administered  . Influenza, High Dose Seasonal PF 06/02/2016, 05/25/2017  . Pneumococcal Conjugate-13 06/14/2016  . Pneumococcal Polysaccharide-23 11/02/2017  . Tdap 08/31/2011   Last Pap smear: 2015. Denies any abnormal pap smears.  Last mammogram: February 2019 Last colonoscopy: refuses but agrees to have Cologuard Last DEXA: 08/2016 and osteopenia  Dentist: none. Has dentures but poor fitting per patient.  Ophtho: January 2018  Exercise: none  Lives alone. Is not working. Does not drive. Has 8 children. One daughter with her today.   Other doctors caring for patient include: OB/GYN Dr. Hulan Fray- last saw her in 2015    Depression screen:  See questionnaire below.  Depression screen [facility] 2/9 04/17/2018 06/14/2016  Decreased Interest 0 0  Down, Depressed, Hopeless 0 0  PHQ - 2 Score 0 0    Fall Risk Screen: see questionnaire below. Fall Risk  04/17/2018 06/14/2016  Falls in the past year? No No    ADL screen:  See questionnaire below Functional Status Survey: Is the patient deaf or have difficulty hearing?: No Does the patient have difficulty seeing, even when wearing glasses/contacts?: Yes Does the patient have difficulty concentrating, remembering, or making decisions?: No Does the patient have difficulty walking or climbing stairs?: No Does the patient have difficulty dressing or bathing?: No Does the  patient have difficulty doing errands alone such as visiting a doctor's office or shopping?: No   End of Life Discussion:  Patient does not have a living will and medical power of attorney. Her daughter is Nadyne Coombes would be her HCPOA.   Review of Systems Constitutional: -fever, -chills, -sweats, -unexpected weight change, -anorexia, +mild fatigue Allergy: -sneezing, -itching, -congestion Dermatology: denies changing moles, rash, lumps, new worrisome lesions ENT: -runny nose, -ear pain, -sore throat, -hoarseness, -sinus pain, -teeth pain, -tinnitus, -hearing loss, -epistaxis Cardiology:  -chest pain, -palpitations, -edema, -orthopnea, -paroxysmal nocturnal dyspnea Respiratory: -cough, -shortness of breath, -dyspnea on exertion, -wheezing, -hemoptysis Gastroenterology: -abdominal pain, -nausea, -vomiting, -diarrhea, -constipation, -blood in stool, -changes in bowel movement, -dysphagia Hematology: -bleeding or bruising problems Musculoskeletal: -arthralgias, -myalgias, -joint swelling, -back pain, -neck pain, -cramping, -gait changes Ophthalmology: -vision changes, -eye redness, -itching, -discharge Urology: -dysuria, -difficulty urinating, -hematuria, -urinary frequency, -urgency, incontinence Neurology: -headache, -weakness, -tingling, -numbness, -speech abnormality, -memory loss, -falls, -dizziness Psychology:  -depressed mood, -agitation, -sleep problems    PHYSICAL EXAM:  BP 130/90   Pulse (!) 59   Ht 5' (1.524 m)   Wt 177 lb 9.6 oz (80.6 kg)   BMI 34.69 kg/m   General Appearance: Alert, cooperative, no distress, appears stated age Head: Normocephalic, without obvious abnormality, atraumatic Eyes: PERRL, conjunctiva/corneas clear, EOM's intact, fundi benign Ears: Normal TM's and external ear canals Nose: Nares normal, mucosa normal, no drainage or sinus tenderness Throat: Lips, mucosa, and tongue normal; no upper teeth and gums normal Neck: Supple, no lymphadenopathy;  thyroid: no enlargement/tenderness/nodules; no  carotid bruit or JVD Back: Spine nontender, no curvature, ROM normal, no CVA tenderness Lungs: Clear to auscultation bilaterally without wheezes, rales or ronchi; respirations unlabored Chest Wall: No tenderness or deformity Heart: Regular rate and rhythm, S1 and S2 normal, no murmur, rub or gallop Breast Exam: declines. Mammogram UTD and normal Abdomen: Soft, non-tender, nondistended, normoactive bowel sounds, no masses, no hepatosplenomegaly Genitalia: Normal external genitalia without lesions. Some mild erythema to skin folds of groin and buttocks, no rash or maceration  Extremities: No clubbing, cyanosis or edema Pulses: 2+ and symmetric all extremities Skin: Skin color, texture, turgor normal, no rashes or lesions Lymph nodes: Cervical, supraclavicular, and axillary nodes normal Neurologic: CNII-XII intact, normal strength, sensation and gait; reflexes 2+ and symmetric throughout Psych: Normal mood, affect, hygiene and grooming.  ASSESSMENT/PLAN: Medicare annual wellness visit, subsequent  Essential hypertension - Plan: CBC with Differential/Platelet, Comprehensive metabolic panel  Obesity (BMI 30-39.9) - Plan: CBC with Differential/Platelet, Comprehensive metabolic panel, TSH, Lipid panel, T4, free, Hemoglobin A1c  Vitamin D deficiency - Plan: VITAMIN D 25 Hydroxy (Vit-D Deficiency, Fractures)  Osteopenia, unspecified location  Screen for colon cancer - Plan: Cologuard  Screening for lipid disorders - Plan: Lipid panel  Medication management - Plan: Lipid panel, VITAMIN D 25 Hydroxy (Vit-D Deficiency, Fractures)  Fatigue, unspecified type - Plan: CBC with Differential/Platelet, Comprehensive metabolic panel, TSH, T4, free, Hemoglobin A1c, VITAMIN D 25 Hydroxy (Vit-D Deficiency, Fractures)  She has no issues or concerns with ADLs, no falls or depression.  Mild fatigue- check labs. Recommend she be more active and have some sort of  schedule.  HTN- not quite to goal today but at home reports normal readings. Continue on medication and cut back on sodium, increase physical activity. Continue on vitamin D supplement- check vitamin D level Osteopenia- will recheck bone density after 2 years to see if worsening.  Obesity- discussed potential health risks and counseled on weight loss by diet and exercise Is not on a statin. Will check cholesterol and prescribe medication as needed. She will continue on Omega 3 fatty acid for now.  Refuses colonoscopy. Agrees to [employer].  Follow up pending labs or in 6 months. Will need DEXA .    Discussed monthly self breast exams and yearly mammograms; at least 30 minutes of aerobic activity at least 5 days/week and weight-bearing exercise 2x/week; proper sunscreen use reviewed; healthy diet, including goals of calcium and vitamin D intake and alcohol recommendations (less than or equal to 1 drink/day) reviewed; regular seatbelt use; changing batteries in smoke detectors.  Immunization recommendations discussed.  Colonoscopy recommendations reviewed   Medicare Attestation I have personally reviewed: The patient's medical and social history Their use of alcohol, tobacco or illicit drugs Their current medications and supplements The patient's functional ability including ADLs,fall risks, home safety risks, cognitive, and hearing and visual impairment Diet and physical activities Evidence for depression or mood disorders  The patient's weight, height, and BMI have been recorded in the chart.  I have made referrals, counseling, and provided education to the patient based on review of the above and I have provided the patient with a written personalized care plan for preventive services.     Harland Dingwall, NP-C   04/17/2018

## 2018-04-18 ENCOUNTER — Encounter: Payer: Self-pay | Admitting: Family Medicine

## 2018-04-18 ENCOUNTER — Other Ambulatory Visit: Payer: Self-pay | Admitting: Family Medicine

## 2018-04-18 DIAGNOSIS — R7303 Prediabetes: Secondary | ICD-10-CM | POA: Insufficient documentation

## 2018-04-18 DIAGNOSIS — R740 Nonspecific elevation of levels of transaminase and lactic acid dehydrogenase [LDH]: Secondary | ICD-10-CM

## 2018-04-18 DIAGNOSIS — E1169 Type 2 diabetes mellitus with other specified complication: Secondary | ICD-10-CM | POA: Insufficient documentation

## 2018-04-18 DIAGNOSIS — R7401 Elevation of levels of liver transaminase levels: Secondary | ICD-10-CM | POA: Insufficient documentation

## 2018-04-18 DIAGNOSIS — E7800 Pure hypercholesterolemia, unspecified: Secondary | ICD-10-CM

## 2018-04-18 HISTORY — DX: Elevation of levels of liver transaminase levels: R74.01

## 2018-04-18 LAB — T4, FREE: FREE T4: 1.24 ng/dL (ref 0.82–1.77)

## 2018-04-18 LAB — CBC WITH DIFFERENTIAL/PLATELET
BASOS ABS: 0.1 10*3/uL (ref 0.0–0.2)
Basos: 1 %
EOS (ABSOLUTE): 0.3 10*3/uL (ref 0.0–0.4)
Eos: 3 %
HEMOGLOBIN: 14.2 g/dL (ref 11.1–15.9)
Hematocrit: 42.2 % (ref 34.0–46.6)
IMMATURE GRANS (ABS): 0 10*3/uL (ref 0.0–0.1)
Immature Granulocytes: 0 %
LYMPHS: 31 %
Lymphocytes Absolute: 2.6 10*3/uL (ref 0.7–3.1)
MCH: 31 pg (ref 26.6–33.0)
MCHC: 33.6 g/dL (ref 31.5–35.7)
MCV: 92 fL (ref 79–97)
MONOCYTES: 7 %
Monocytes Absolute: 0.6 10*3/uL (ref 0.1–0.9)
NEUTROS PCT: 58 %
Neutrophils Absolute: 4.7 10*3/uL (ref 1.4–7.0)
PLATELETS: 267 10*3/uL (ref 150–450)
RBC: 4.58 x10E6/uL (ref 3.77–5.28)
RDW: 14.1 % (ref 12.3–15.4)
WBC: 8.2 10*3/uL (ref 3.4–10.8)

## 2018-04-18 LAB — COMPREHENSIVE METABOLIC PANEL
ALT: 41 IU/L — AB (ref 0–32)
AST: 38 IU/L (ref 0–40)
Albumin/Globulin Ratio: 1.4 (ref 1.2–2.2)
Albumin: 4.3 g/dL (ref 3.5–4.8)
Alkaline Phosphatase: 109 IU/L (ref 39–117)
BUN / CREAT RATIO: 16 (ref 12–28)
BUN: 9 mg/dL (ref 8–27)
Bilirubin Total: 0.4 mg/dL (ref 0.0–1.2)
CO2: 24 mmol/L (ref 20–29)
CREATININE: 0.58 mg/dL (ref 0.57–1.00)
Calcium: 9.6 mg/dL (ref 8.7–10.3)
Chloride: 98 mmol/L (ref 96–106)
GFR calc Af Amer: 108 mL/min/{1.73_m2} (ref >59)
GFR calc non Af Amer: 94 mL/min/{1.73_m2} (ref >59)
GLUCOSE: 90 mg/dL (ref 65–99)
Globulin, Total: 3.1 g/dL (ref 1.5–4.5)
Potassium: 4.5 mmol/L (ref 3.5–5.2)
Sodium: 139 mmol/L (ref 134–144)
TOTAL PROTEIN: 7.4 g/dL (ref 6.0–8.5)

## 2018-04-18 LAB — LIPID PANEL
Chol/HDL Ratio: 3.5 ratio (ref 0.0–4.4)
Cholesterol, Total: 181 mg/dL (ref 100–199)
HDL: 51 mg/dL (ref >39)
LDL Calculated: 105 mg/dL — ABNORMAL HIGH (ref 0–99)
TRIGLYCERIDES: 125 mg/dL (ref 0–149)
VLDL CHOLESTEROL CAL: 25 mg/dL (ref 5–40)

## 2018-04-18 LAB — VITAMIN D 25 HYDROXY (VIT D DEFICIENCY, FRACTURES): Vit D, 25-Hydroxy: 27.1 ng/mL — ABNORMAL LOW (ref 30.0–100.0)

## 2018-04-18 LAB — HEMOGLOBIN A1C
Est. average glucose Bld gHb Est-mCnc: 131 mg/dL
Hgb A1c MFr Bld: 6.2 % — ABNORMAL HIGH (ref 4.8–5.6)

## 2018-04-18 LAB — TSH: TSH: 2.11 u[IU]/mL (ref 0.450–4.500)

## 2018-04-24 ENCOUNTER — Other Ambulatory Visit: Payer: Self-pay | Admitting: Family Medicine

## 2018-04-24 MED ORDER — SIMVASTATIN 10 MG PO TABS: 10.0000 mg | ORAL_TABLET | Freq: Every day | ORAL | 1 refills | Status: DC

## 2018-04-27 ENCOUNTER — Encounter: Payer: Self-pay | Admitting: Family Medicine

## 2018-05-03 LAB — HEPATITIS PANEL, ACUTE
HEP A IGM: NEGATIVE
HEP B C IGM: NEGATIVE
HEP B S AG: NEGATIVE
Hep C Virus Ab: <0.1 s/co ratio (ref 0.0–0.9)

## 2018-05-03 LAB — SPECIMEN STATUS REPORT

## 2018-05-04 ENCOUNTER — Encounter: Payer: Self-pay | Admitting: Family Medicine

## 2018-06-05 ENCOUNTER — Encounter: Payer: Self-pay | Admitting: Family Medicine

## 2018-06-05 ENCOUNTER — Ambulatory Visit (INDEPENDENT_AMBULATORY_CARE_PROVIDER_SITE_OTHER): Payer: Medicare Other | Admitting: Family Medicine

## 2018-06-05 VITALS — BP 120/80 | HR 65 | Temp 98.2°F | Resp 15 | Wt 178.5 lb

## 2018-06-05 DIAGNOSIS — E7800 Pure hypercholesterolemia, unspecified: Secondary | ICD-10-CM | POA: Diagnosis not present

## 2018-06-05 DIAGNOSIS — R0982 Postnasal drip: Secondary | ICD-10-CM

## 2018-06-05 DIAGNOSIS — R05 Cough: Secondary | ICD-10-CM | POA: Diagnosis not present

## 2018-06-05 DIAGNOSIS — R058 Other specified cough: Secondary | ICD-10-CM

## 2018-06-05 MED ORDER — AZITHROMYCIN 250 MG PO TABS: ORAL_TABLET | ORAL | 0 refills | Status: DC

## 2018-06-05 MED ORDER — BENZONATATE 200 MG PO CAPS: 200.0000 mg | ORAL_CAPSULE | Freq: Two times a day (BID) | ORAL | 0 refills | Status: DC | PRN

## 2018-06-05 NOTE — Progress Notes (Signed)
Chief Complaint  Patient presents with  . cough    cough for 3 weeks, not getting better, follow-up on cholesterol    Subjective:  Julie Orozco is a 72 y.o. female who presents for a 3 week history of left ear pain, post nasal drainage, sore throat and chest congestion with mainly dry cough.   Denies fever, chills, headache, sinus pressure, chest pain, palpitations, shortness of breath, abdominal pain, N/V/D.   Treatment to date: Robitussin, Theraflu and cough syrup.  Denies sick contacts.  No other aggravating or relieving factors.  No other c/o.  She was also scheduled to come in today for a lab visit for recheck of lipids since starting on a statin.  Reports taking it daily and no side effects  ROS as in subjective.   Objective: Vitals:   06/05/18 0856  BP: 120/80  Pulse: 65  Resp: 15  Temp: 98.2 F (36.8 C)  SpO2: 95%    General appearance: Alert, WD/WN, no distress, mildly ill appearing                             Skin: warm, no rash                           Head: no sinus tenderness                            Eyes: conjunctiva normal, corneas clear, PERRLA                            Ears: pearly TMs, external ear canals normal                          Nose: septum midline, turbinates swollen, with erythema and clear discharge             Mouth/throat: MMM, tongue normal, mild pharyngeal erythema, no edema or exudate                           Neck: supple, no adenopathy, no thyromegaly, nontender                          Heart: RRR, normal S1, S2, no murmurs                         Lungs: CTA bilaterally, + faint wheezes, rales, or rhonchi      Assessment: Productive cough - Plan: azithromycin (ZITHROMAX Z-PAK) 250 MG tablet, benzonatate (TESSALON) 200 MG capsule  Elevated LDL cholesterol level - Plan: Lipid Panel  Post-nasal drainage    Plan: Discussed diagnosis and treatment of productive cough and acute URI symptoms.  Z-Pak and Tessalon prescribed  suggested symptomatic OTC remedies.  Salt water gargles and nasal saline spray for congestion.  Tylenol or Ibuprofen OTC for fever and malaise.  Call/return if worsening or not back to baseline after day 10 of starting the antibiotic. Reports good compliance with statin.  Recheck lipid panel.  Follow-up pending results

## 2018-06-05 NOTE — Patient Instructions (Signed)
Take the antibiotic as prescribed.  Stay well-hydrated. Also try Mucinex or Robitussin.  You can take the [facility] for cough as well.  Use salt water gargles several times per day.  If you are not back to baseline after day 10 of starting the antibiotic then let me know.

## 2018-06-06 LAB — LIPID PANEL
Chol/HDL Ratio: 2.9 ratio (ref 0.0–4.4)
Cholesterol, Total: 150 mg/dL (ref 100–199)
HDL: 52 mg/dL (ref >39)
LDL Calculated: 76 mg/dL (ref 0–99)
Triglycerides: 108 mg/dL (ref 0–149)
VLDL Cholesterol Cal: 22 mg/dL (ref 5–40)

## 2018-08-08 ENCOUNTER — Other Ambulatory Visit (INDEPENDENT_AMBULATORY_CARE_PROVIDER_SITE_OTHER): Payer: Medicare Other

## 2018-08-08 DIAGNOSIS — Z23 Encounter for immunization: Secondary | ICD-10-CM | POA: Diagnosis not present

## 2018-09-11 ENCOUNTER — Other Ambulatory Visit: Payer: Self-pay

## 2018-09-11 ENCOUNTER — Telehealth: Payer: Self-pay | Admitting: Family Medicine

## 2018-09-11 DIAGNOSIS — I10 Essential (primary) hypertension: Secondary | ICD-10-CM

## 2018-09-11 MED ORDER — LISINOPRIL 10 MG PO TABS: 10.0000 mg | ORAL_TABLET | Freq: Every day | ORAL | 5 refills | Status: DC

## 2018-09-11 NOTE — Telephone Encounter (Signed)
Daughter called, pt out of bp meds, flying out of town tomorrow, so need today.  [facility] on [address].

## 2018-09-11 NOTE — Telephone Encounter (Signed)
Please call and ok to refill her medication. Thanks

## 2018-09-11 NOTE — Telephone Encounter (Signed)
bp medication was sent to pharmacy.

## 2018-09-21 IMAGING — MG DIGITAL SCREENING BILATERAL MAMMOGRAM WITH TOMO AND CAD
3 series · 3 of 7 positions shown · non-contrast
Comparison: Previous exam(s).

CLINICAL DATA: Screening.

EXAM:
DIGITAL SCREENING BILATERAL MAMMOGRAM WITH TOMO AND CAD

[R CC synth-2D]
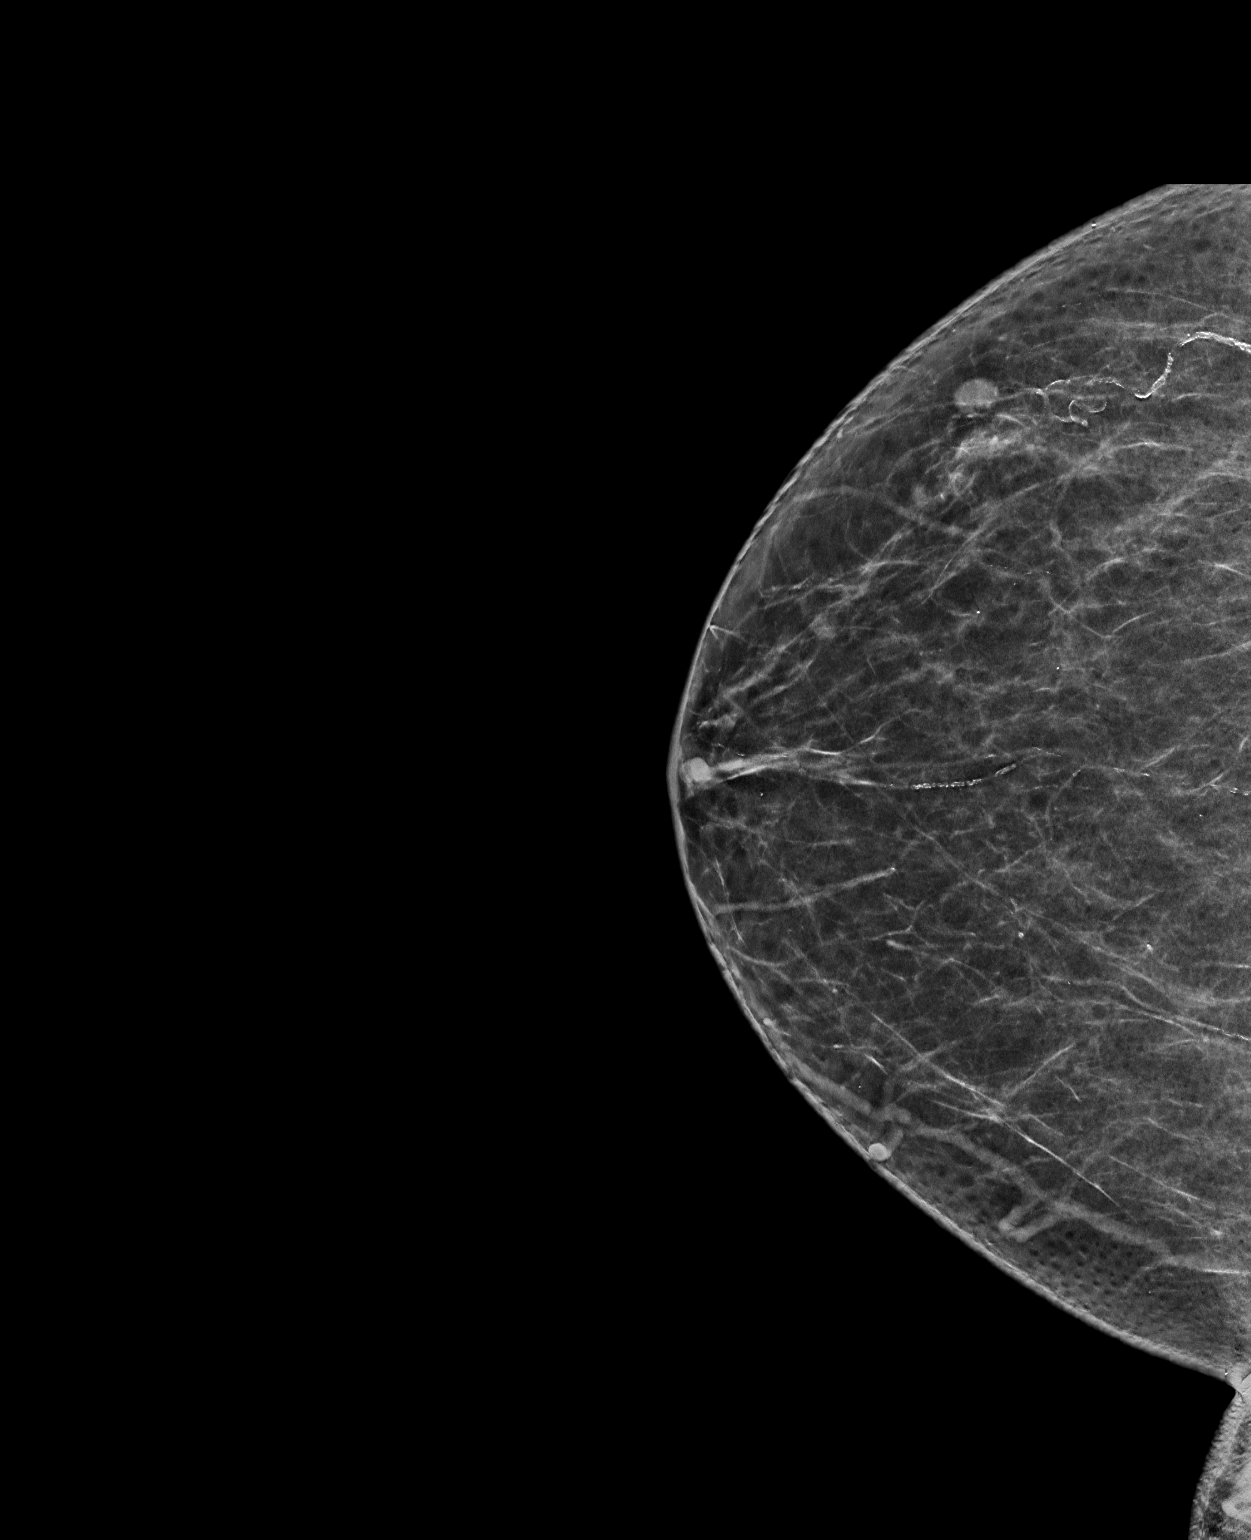

[R MLO synth-2D]
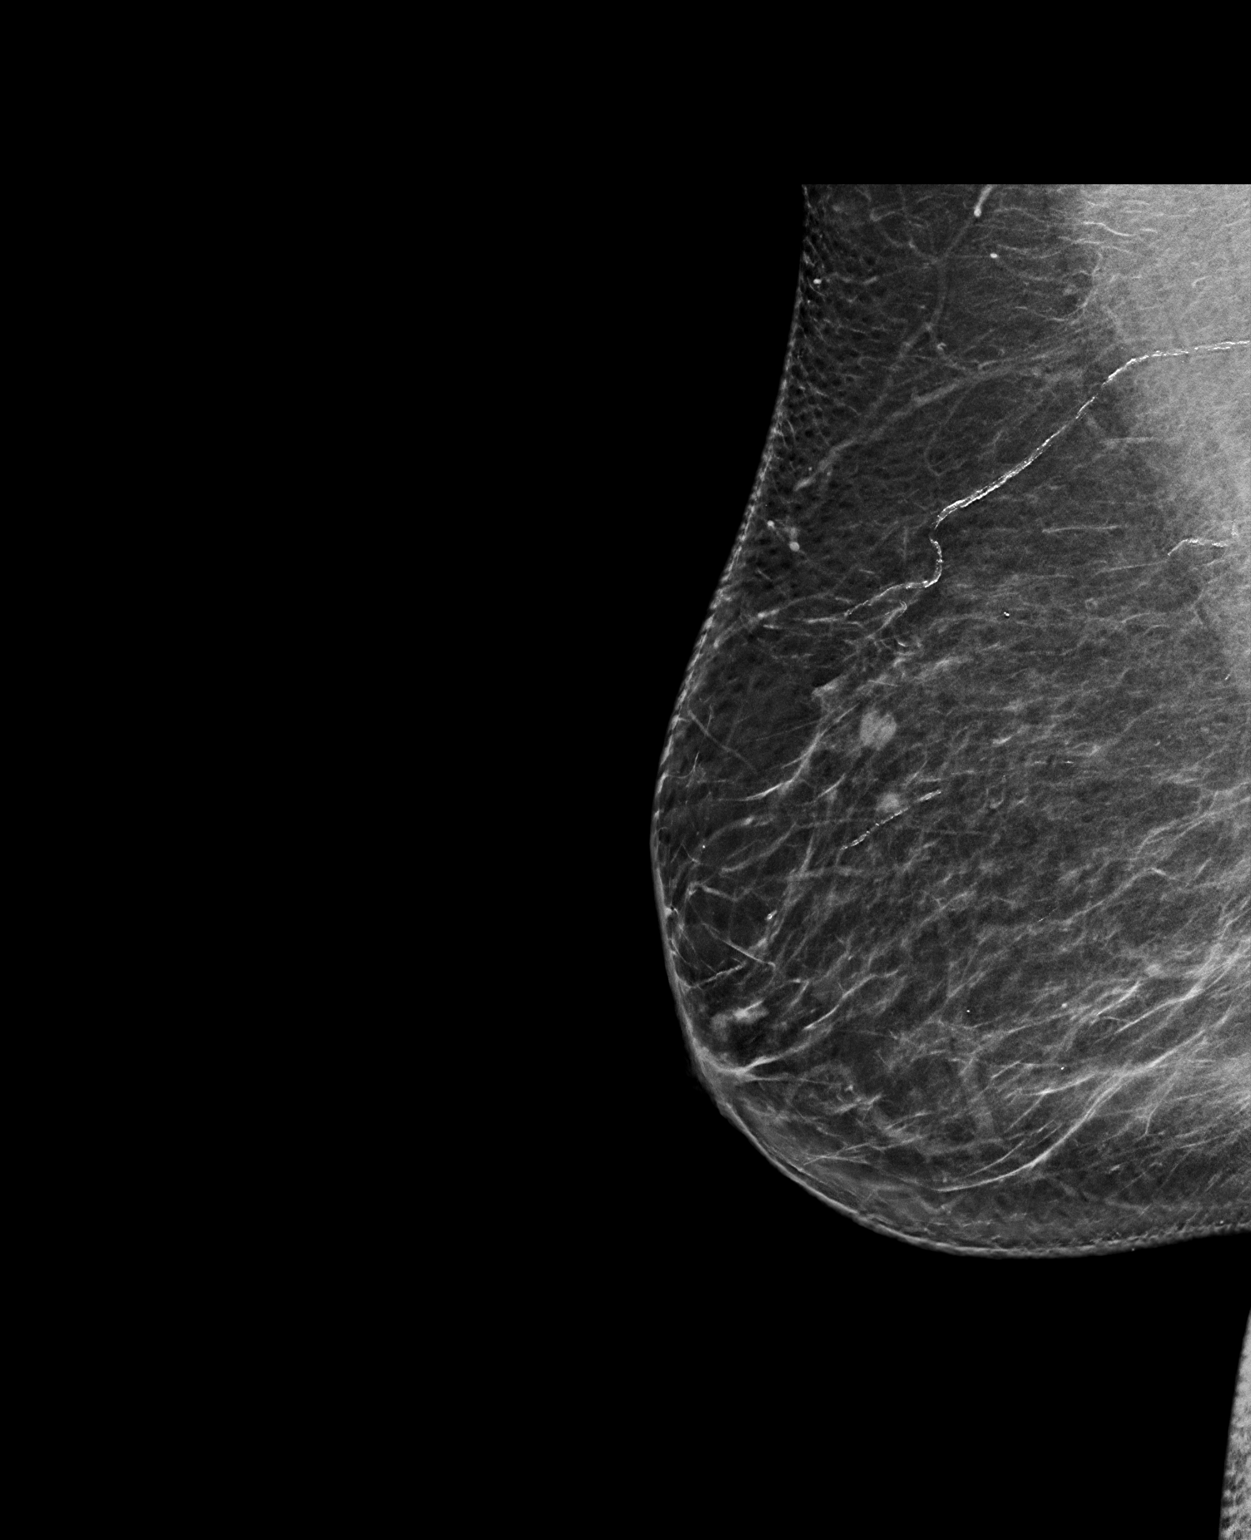

[L MLO tomo · tomo slice 43/86.0]
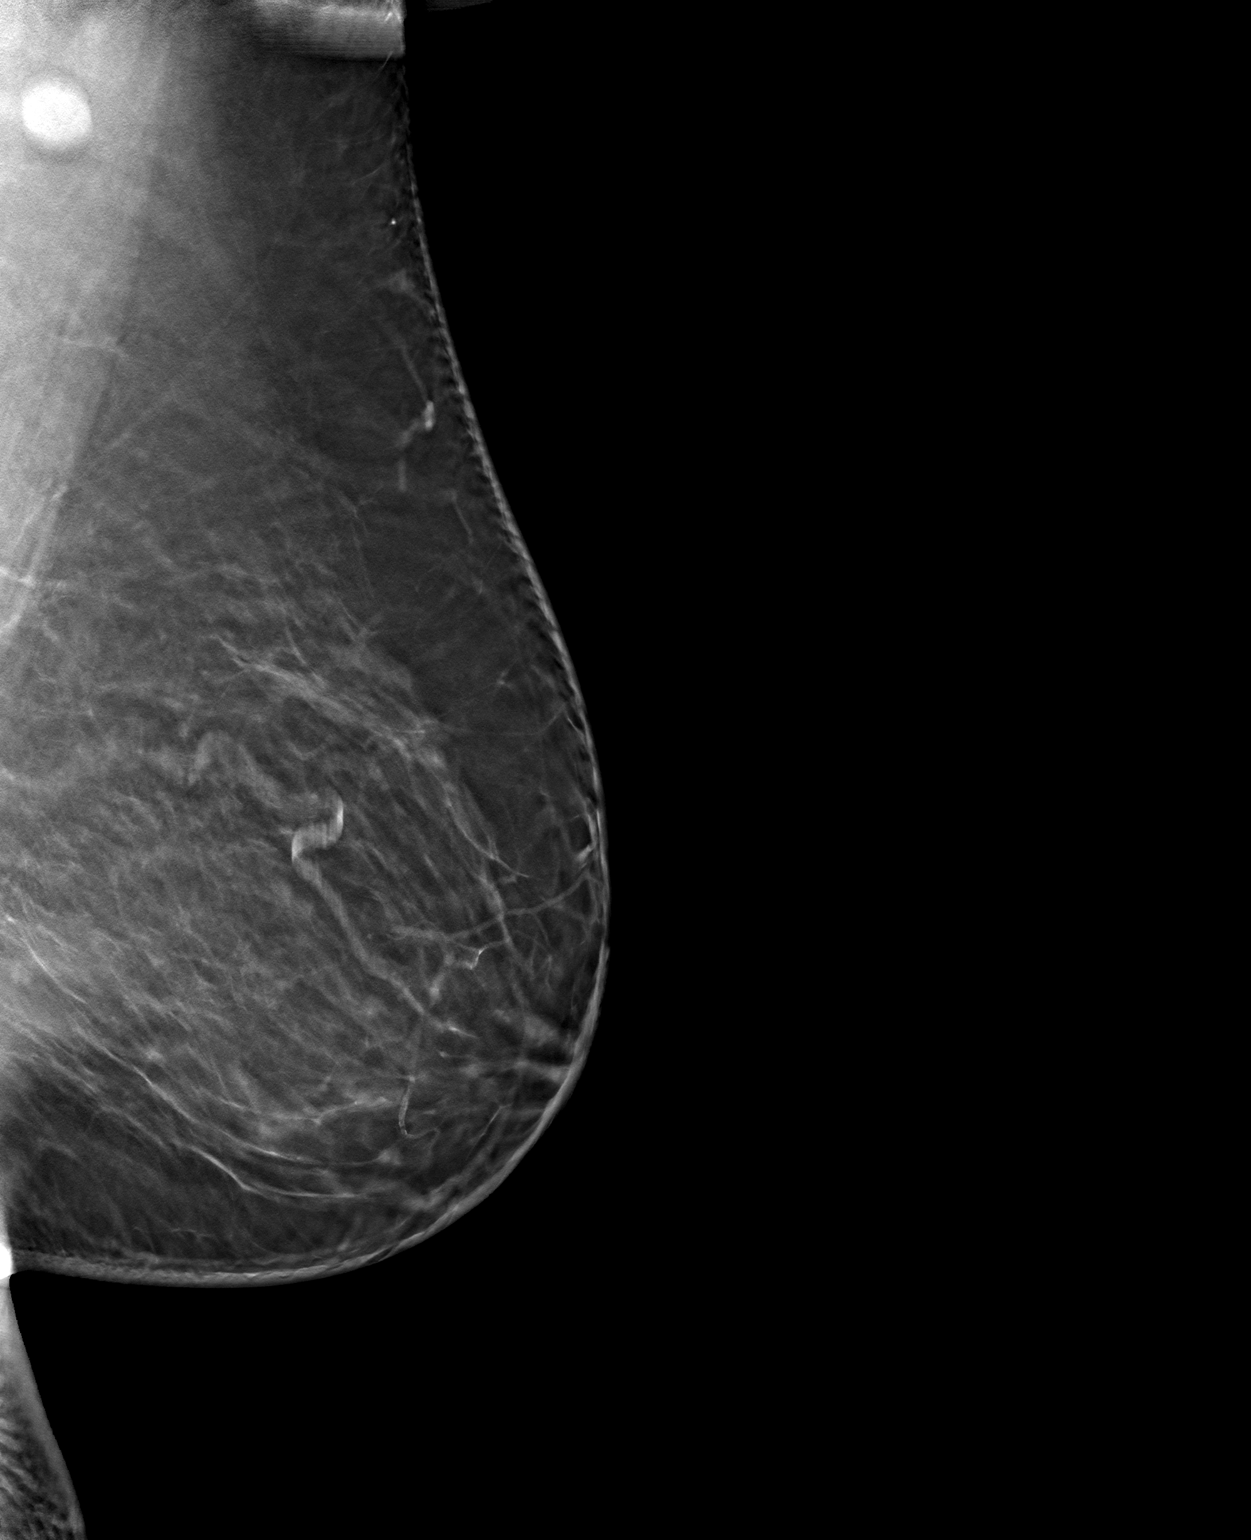

[3 of 7 positions shown; findings below may reference images not displayed]

ACR Breast Density Category b: There are scattered areas of
fibroglandular density.
FINDINGS: There are no findings suspicious for malignancy. Images were
processed with CAD.
IMPRESSION: No mammographic evidence of malignancy. A result letter of this
screening mammogram will be mailed directly to the patient.

RECOMMENDATION:
Screening mammogram in one year. (Code:[id number])

BI-RADS CATEGORY  1: Negative.

## 2018-09-26 ENCOUNTER — Other Ambulatory Visit: Payer: Self-pay | Admitting: Family Medicine

## 2018-09-26 DIAGNOSIS — Z1231 Encounter for screening mammogram for malignant neoplasm of breast: Secondary | ICD-10-CM

## 2018-09-29 ENCOUNTER — Encounter: Payer: Self-pay | Admitting: Family Medicine

## 2018-09-29 ENCOUNTER — Ambulatory Visit (INDEPENDENT_AMBULATORY_CARE_PROVIDER_SITE_OTHER): Payer: Medicare Other | Admitting: Family Medicine

## 2018-09-29 VITALS — BP 124/80 | HR 71 | Temp 98.7°F | Resp 16 | Wt 176.8 lb

## 2018-09-29 DIAGNOSIS — R05 Cough: Secondary | ICD-10-CM | POA: Diagnosis not present

## 2018-09-29 DIAGNOSIS — R058 Other specified cough: Secondary | ICD-10-CM

## 2018-09-29 DIAGNOSIS — J0140 Acute pansinusitis, unspecified: Secondary | ICD-10-CM

## 2018-09-29 MED ORDER — AMOXICILLIN-POT CLAVULANATE 875-125 MG PO TABS: 1.0000 | ORAL_TABLET | Freq: Two times a day (BID) | ORAL | 0 refills | Status: DC

## 2018-09-29 MED ORDER — SIMVASTATIN 10 MG PO TABS: 10.0000 mg | ORAL_TABLET | Freq: Every day | ORAL | 1 refills | Status: DC

## 2018-09-29 NOTE — Progress Notes (Signed)
Chief Complaint  Patient presents with  . bad cough    bad cough- 2 weeks and getting worse, sore throat, drainage, headache, sinus pressure    Subjective:  Julie Orozco is a 73 y.o. female who presents for a 2 week history of rhinorrhea, nasal congestion, frontal headache, sinus pressure, sore throat, right ear pain, and now a productive cough.   Denies fever, chills, chest pain, palpitations, shortness of breath, wheezing, abdominal pain, N/V/D.   Treatment to date: Theraflu, Robitussin DM, Motrin .  + sick contacts.  No other aggravating or relieving factors.  No other c/o.  ROS as in subjective.   Objective: Vitals:   09/29/18 1454  BP: 124/80  Pulse: 71  Resp: 16  Temp: 98.7 F (37.1 C)  SpO2: 98%    General appearance: Alert, WD/WN, no distress, mildly ill appearing                             Skin: warm, no rash                           Head: + frontal, maxillary and ethmoid sinus tenderness                            Eyes: conjunctiva normal, corneas clear, PERRLA                            Ears: pearly TMs, external ear canals normal                          Nose: septum midline, turbinates swollen, with erythema and thick discharge             Mouth/throat: MMM, tongue normal, mild pharyngeal erythema, no edema or exudate                           Neck: supple, no adenopathy, no thyromegaly, nontender                          Heart: RRR, normal S1, S2, no murmurs                         Lungs: CTA bilaterally, no wheezes, rales, or rhonchi      Assessment: Acute pansinusitis, recurrence not specified - Plan: amoxicillin-clavulanate (AUGMENTIN) 875-125 MG tablet  Productive cough - Plan: amoxicillin-clavulanate (AUGMENTIN) 875-125 MG tablet    Plan: Discussed diagnosis and treatment of acute sinusitis and productive cough. Augmentin prescribed. Norel AD samples given.  Suggested symptomatic OTC remedies. Call or return if worsening or not back to  baseline.

## 2018-09-29 NOTE — Patient Instructions (Signed)
Take the antibiotic as prescribed.  I also recommend taking a probiotic or eating yogurt daily while on this antibiotic.  Stay well-hydrated.  Take the Norel AD 1 tablet every 4-6 hours. Do not take other cold medications with this.

## 2018-10-18 ENCOUNTER — Ambulatory Visit (INDEPENDENT_AMBULATORY_CARE_PROVIDER_SITE_OTHER): Payer: Medicare Other | Admitting: Family Medicine

## 2018-10-18 ENCOUNTER — Encounter: Payer: Self-pay | Admitting: Family Medicine

## 2018-10-18 VITALS — BP 110/70 | HR 56 | Wt 177.2 lb

## 2018-10-18 DIAGNOSIS — R7303 Prediabetes: Secondary | ICD-10-CM | POA: Diagnosis not present

## 2018-10-18 DIAGNOSIS — R319 Hematuria, unspecified: Secondary | ICD-10-CM | POA: Diagnosis not present

## 2018-10-18 DIAGNOSIS — Z9189 Other specified personal risk factors, not elsewhere classified: Secondary | ICD-10-CM

## 2018-10-18 DIAGNOSIS — M8580 Other specified disorders of bone density and structure, unspecified site: Secondary | ICD-10-CM | POA: Diagnosis not present

## 2018-10-18 DIAGNOSIS — E2839 Other primary ovarian failure: Secondary | ICD-10-CM

## 2018-10-18 DIAGNOSIS — I10 Essential (primary) hypertension: Secondary | ICD-10-CM | POA: Diagnosis not present

## 2018-10-18 DIAGNOSIS — E559 Vitamin D deficiency, unspecified: Secondary | ICD-10-CM

## 2018-10-18 DIAGNOSIS — R32 Unspecified urinary incontinence: Secondary | ICD-10-CM

## 2018-10-18 DIAGNOSIS — R7401 Elevation of levels of liver transaminase levels: Secondary | ICD-10-CM

## 2018-10-18 DIAGNOSIS — R740 Nonspecific elevation of levels of transaminase and lactic acid dehydrogenase [LDH]: Secondary | ICD-10-CM

## 2018-10-18 LAB — POCT GLYCOSYLATED HEMOGLOBIN (HGB A1C): Hemoglobin A1C: 6.1 % — AB (ref 4.0–5.6)

## 2018-10-18 LAB — POCT URINALYSIS DIP (PROADVANTAGE DEVICE)
Bilirubin, UA: NEGATIVE
Glucose, UA: NEGATIVE mg/dL
Ketones, POC UA: NEGATIVE mg/dL
Leukocytes, UA: NEGATIVE
NITRITE UA: NEGATIVE
Protein Ur, POC: NEGATIVE mg/dL
Specific Gravity, Urine: 1.03
Urobilinogen, Ur: NEGATIVE
pH, UA: 6 (ref 5.0–8.0)

## 2018-10-18 NOTE — Patient Instructions (Addendum)
Your hemoglobin A1c is 6.1% and this is still in prediabetes range.  Your blood pressure is 110/70 and this is good.  Continue on your medication.  Call and schedule your bone density test as discussed at the breast center.  We will check your vitamin D level and adjust dose if needed.  I do recommend you take a vitamin D supplement daily. It is recommended that you get 1200 mg of calcium per day, ideally in your diet.  Do not take more than 500 mg of calcium as a vitamin at one time. Increase your physical activity and make sure you are walking more or getting some form of weightbearing exercise.  We will call you with your results.   Osteopenia Osteopenia  La osteopenia es la prdida de grosor (densidad) en el interior de [place]. Otra nombre para la osteopenia es una masa sea baja. La osteopenia leve es una parte normal del envejecimiento. No es una enfermedad y no causa sntomas. Sin embargo, si tiene osteopenia y contina perdiendo masa sea, puede desarrollar una afeccin que hace que los huesos se vuelvan delgados y se fracturen con ms facilidad (osteoporosis). Tambin puede perder parte de la altura, tener dolor de espalda y Lucilla Edin postura encorvada. Aunque la osteopenia no es una enfermedad, realizar cambios en el estilo de vida y la dieta puede ayudar a evitar que la osteopenia se convierta en osteoporosis. Cules son las causas? La osteopenia es causada por la prdida de [employer].  Los huesos estn constantemente cambiando. Las clulas seas viejas se reemplazan continuamente por clulas seas nuevas. Este proceso genera hueso nuevo. El mineral calcio es necesario para fabricar hueso nuevo y Theatre manager la densidad sea. La densidad sea generalmente se encuentra en su punto ms alta alrededor de los 35 aos. Despus de eso, el cuerpo de la la [employer] de las personas reemplaza todo hueso que han perdido con hueso nuevo. Qu incrementa el riesgo? Es ms probable que  usted sufra esta afeccin si:  Es mayor de 85UDJ.  Es Musician que entr en la menopusica de [place] temprana.  Tiene una enfermedad prolongada que la mantiene en cama.  No realiza suficiente actividad fsica.  Le faltan ciertos nutrientes (malnutricin).  Tiene hiperactividad tiroidea (hipertiroidismo).  Fuma.  Bebe mucho alcohol.  Toma medicamentos que [employer], como los corticoesteroides. Cules son los signos o los sntomas? Esta afeccin no causa ningn sntoma. Puede tener un riesgo levemente ms alto de rotura de huesos (fracturas), de modo que sufrir fracturas ms fcilmente que lo normal puede ser un indicio de osteopenia. Cmo se diagnostica? El mdico puede diagnosticar esta afeccin con un tipo especial de examen radiogrfico que mide la densidad sea (absorciometra de rayos X de energa dual, DEXA). Esta prueba puede medir la densidad sea en la cadera, la columna vertebral y las muecas. La osteopenia no presenta sntomas, de modo que esta afeccin se diagnostica generalmente despus de una prueba de deteccin de rutina de la densidad sea para la osteoporosis. Este estudio de deteccin de rutina se realiza para generalmente en:  Mujeres a partir de los 64 aos.  Hombres a [employer]. Si tiene factores de riesgo de sufrir osteopenia, puede someterse a la prueba de deteccin a una edad ms temprana. Cmo se trata? Hacer cambios en la dieta y el estilo de vida puede disminuir el riesgo de padecer osteoporosis. Si tiene osteopenia grave que est cerca de convertirse en osteoporosis, el mdico puede recetarle  medicamentos y suplementos nutricionales tales como calcio y vitamina D. Estos suplementos ayudan a recuperar la densidad sea. Siga estas indicaciones en su casa:   Tome los medicamentos de venta libre y los recetados solamente como se lo haya indicado el mdico. Estos [place] y los suplementos.  Consuma una dieta rica  en calcio y vitamina D. ? El calcio se encuentra en los productos lcteos, los frijoles, el salmn y las verduras de hoja verde, como la espinaca y el brcoli. ? Busque alimentos que contengan vitamina D y calcio agregados (alimentos fortificados), como jugo de [place], cereales, y pan.  Haga 30 minutos o ms de una actividad fsica con soporte de [employer]ning [employer], Administrator, sports, trotar o Automotive engineer. Estos tipos de [employer].  Tome las precauciones necesarias en su casa para reducir el riesgo de cadas, como: ? Mantener las habitaciones bien iluminadas y libres de obstculos como cables. ? Instalar barandas de seguridad en las escaleras. ? Usar tapetes de caucho en el bao y en otros sectores que a menudo estn mojados o son resbaladizos.  No consuma ningn producto que contenga nicotina o tabaco, como cigarrillos y Psychologist, sport and exercise. Si necesita ayuda para dejar de fumar, consulte al mdico.  Evite el alcohol o limite su consumo a no ms de 43medida por da si es mujer y no est [place], y 64medidas por da si es hombre. Una medida equivale a 12oz (333ml) de cerveza, 5oz (162ml) de vino o 1oz (27ml) de bebidas alcohlicas de alta graduacin.  Concurra a todas las visitas de control como se lo haya indicado el mdico. Esto es importante. Comunquese con un mdico si:  No se ha realizado una prueba de deteccin de la densidad sea para la osteoporosis y usted: ? Es una mujer de 65 aos o ms. ? Es un hombre de 70 aos o ms.  Es una mujer posmenopusica que no se ha sometido a una prueba de deteccin de la densidad sea para la osteoporosis.  Tiene ms de 63 aos y desea saber si debe someterse a una prueba de deteccin de la densidad sea para la osteoporosis. Resumen  La osteopenia es la prdida de grosor (densidad) en el interior de [place]. Otra nombre para la osteopenia es una masa sea baja.  La osteopenia no es [place] enfermedad,  pero puede aumentar el riesgo de Actor una afeccin que hace que los huesos se vuelvan delgados y se fracturen con ms facilidad (osteoporosis).  Puede estar en riesgo de padecer osteopenia si es mayor de 33 aos o si es una mujer que comenz la menopausia de [place] temprana.  La osteopenia no causa sntomas, pero puede diagnosticarse con una prueba de deteccin de la densidad sea.  Los [employer] dieta y el estilo de vida son Software engineer tratamiento para osteopenia. Estos pueden reducir Catering manager de osteoporosis, Esta informacin no tiene Marine scientist el consejo del mdico. Asegrese de hacerle al mdico cualquier pregunta que tenga. Document Released: 08/09/2017 Document Revised: 08/09/2017 Document Reviewed: 08/09/2017 Elsevier Interactive Patient Education  2019 [place] Urinary Incontinence  La incontinencia urinaria es una afeccin en la cual una persona no puede controlar dnde y Financial risk analyst. Ardelia Mems persona con esta afeccin orinar en un momento y Engineer, civil (consulting) no deseados (involuntariamente). Cules son las causas? Esta afeccin puede ser causada por lo siguiente:  Medicamentos.  Infecciones.  Estreimiento.  Msculos de la vejiga hiperactivos.  Msculos  de la vejiga dbiles.  Msculos del suelo de la pelvis dbiles. Estos msculos proporcionan apoyo a la vejiga, el intestino y el tero, en el caso de las [place].  Aumento del tamao de la prstata en los hombres. La prstata es una glndula cerca de la vejiga. Cuando se [place], puede comprimir la [place]. Si la uretra est obstruida, la vejiga puede debilitarse y perder la capacidad para vaciarse correctamente.  Ciruga.  Factores emocionales, como la ansiedad, el estrs o el trastorno de estrs postraumtico (TEP).  Prolapso de los rganos de la pelvis. Esto ocurre en las mujeres, cuando los rganos se salen de Environmental consultant, hacia la vagina. Este cambio de lugar puede evitar que  la vejiga y la uretra funcionen correctamente. Qu incrementa el riesgo? Los siguientes factores pueden hacer que usted sea ms propenso a Best boy esta afeccin:  Edad avanzada.  Obesidad e inactividad fsica.  Embarazo y [place].  Menopausia.  Las enfermedades que [employer] nervios o la mdula espinal (enfermedades neurolgicas).  Tos de larga duracin (crnica). Esto puede aumentar la presin en los msculos de la vejiga y del suelo plvico. Cules son los signos o los sntomas? Los sntomas pueden variar, segn el tipo de incontinencia urinaria que tenga. Estos incluyen los siguientes:  Una necesidad repentina de Garment/textile technologist, y orinar involuntariamente antes de llegar al bao (incontinencia imperiosa).  Orinar repentinamente al realizar una actividad que provoque la miccin, como toser, rer, Field seismologist ejercicio o estornudar (incontinencia urinaria de esfuerzo).  Tener ganas de orinar con frecuencia, y Garment/textile technologist en pequeas cantidades o por goteo (incontinencia por rebosamiento).  Orinar porque no puede llegar al bao a tiempo a causa de una discapacidad fsica, como la artritis o una lesin, o de afecciones comunicativas o cognitivas, como la enfermedad de Alzheimer (incontinencia funcional). Cmo se diagnostica? Esta afeccin se puede diagnosticar en funcin de lo siguiente:  Sus antecedentes mdicos.  Un examen fsico.  Estudios, como por ejemplo: ? Anlisis de [place]. ? Radiografas del rin y de la vejiga. ? Ecografa. ? Exploracin por tomografa computarizada (TC). ? Cistoscopia. En este procedimiento, el mdico inserta un tubo con Ardelia Mems luz y Carlota Raspberry (cistoscopio) por la uretra hacia la vejiga para Education officer, environmental. ? Estudios urodinmicos. Estos estudios evalan la capacidad de la vejiga, de la uretra y del esfnter para Financial controller y Radio broadcast assistant orina. Hay distintos tipos de estudios urodinmicos, que pueden variar segn lo que Geophysical data processor. Para ayudar a  diagnosticar la afeccin, el mdico puede recomendarle que mantenga un registro de la frecuencia con la que [place] y de la cantidad de [place]. Cmo se trata? El tratamiento de esta afeccin depende del tipo de incontinencia que tenga y de su causa. El tratamiento puede incluir lo siguiente:  Cambios en el estilo de vida, como por ejemplo: ? Dejar de fumar. ? Mantener un peso saludable. ? Mantenerse activo. Trate de hacer 122minutos de ejercicio de intensidad moderada cada semana. Pregntele al mdico qu actividades son seguras para usted. ? Consumir una dieta saludable.  Evite los alimentos con alto contenido de grasa, como los alimentos fritos.  Reduzca el consumo de carbohidratos refinados, como el pan blanco y el arroz blanco.  Limite la cantidad de cafena que consume.  Aumente el consumo de [place]. Los alimentos como frutas y verduras frescas, los frijoles y los cereales integrales son fuentes saludables de [place].  Ejercicios para el msculo del piso plvico.  Ejercitacin de la vejiga, como prolongar la cantidad de [employer] idas al  bao o ir al bao en intervalos regulares.  Aplicar tcnicas para reducir las urgencias de la vejiga. Esto puede incluir tcnicas de distraccin o ejercicios de respiracin controlada.  Medicamentos para Media planner los msculos de la vejiga y Product/process development scientist los espasmos de la vejiga.  Medicamentos para disminuir o Geographical information systems officer de la prstata, en el caso de los hombres.  Inyecciones de btox. Esto puede ayudar a [employer] de la vejiga.  Usar pulsos elctricos para alterar los reflejos de la vejiga (estimulacin nerviosa elctrica).  En el caso de las [place], [place] un dispositivo mdico para evitar prdidas de [place]. Este es un dispositivo pequeo y Insurance risk surveyor, parecido a un tampn, que se inserta en la uretra.  Inyectar colgeno o perlas de carbono (espesantes) en el esfnter urinario. Esto puede ayudar a espesar el tejido y  cerrar la abertura de la vejiga.  Ciruga. Siga estas indicaciones en su casa: Estilo de vida  Limite el consumo de alcohol y cafena. Estos pueden llenar la vejiga rpidamente e irritarla.  Mantenga su higiene personal para evitar malos olores y lesiones cutneas. Pregntele al mdico acerca de cremas y productos para la piel que puedan proteger la piel de la [place].  Considere usar compresas o paales para adultos. Asegrese de cambiarlos con regularidad y siempre cmbielos tras un episodio de incontinencia. Instrucciones generales  [employer] de venta libre y los recetados solamente como se lo haya indicado el mdico.  Trate de ir al bao cada 3 o 4 horas, aunque no sienta la necesidad de Garment/textile technologist. Tmese el tiempo para vaciar la vejiga completamente cada vez que orine. Despus de orinar, espere un minuto. Luego trate de orinar nuevamente.  Adopte una posicin cmoda para orinar.  Si la causa de su incontinencia son problemas nerviosos, lleve un registro de los medicamentos que toma y de las veces que vaya al bao.  Concurra a todas las visitas de seguimiento como se lo haya indicado el mdico. Esto es importante. Comunquese con un mdico si:  Siente un dolor que empeora.  La incontinencia empeora. Solicite ayuda de inmediato si:  Tiene fiebre o siente escalofros.  No puede orinar.  Tiene enrojecimiento en la zona de la ingle o en las piernas. Resumen  La incontinencia urinaria es una afeccin en la cual una persona no puede controlar dnde y Financial risk analyst.  Esta afeccin puede ser causada por medicamentos, infecciones, debilidad en los msculos de la vejiga, debilidad en los msculos del suelo plvico, agrandamiento de la prstata (en los hombres) o [place].  Los siguientes factores aumentan el riesgo de tener esta afeccin: Salena Saner, obesidad, [place] y [place], menopausia, enfermedades neurolgicas y tos crnica.  Hay muchos tipos de incontinencia urinaria.  Estos incluyen incontinencia imperiosa, incontinencia urinaria de esfuerzo, incontinencia por rebosamiento e incontinencia funcional.  Por lo general, esta afeccin se trata primero con cambios en el estilo de vida y el comportamiento, como dejar de fumar, adoptar una alimentacin saludable y Field seismologist ejercicios para el suelo plvico con regularidad. Otras opciones de tratamiento incluyen medicamentos, espesantes, dispositivos mdicos, neuroestimulacin elctrica o ciruga. Esta informacin no tiene Marine scientist el consejo del mdico. Asegrese de hacerle al mdico cualquier pregunta que tenga. Document Released: 08/16/2005 Document Revised: 03/22/2017 Document Reviewed: 03/22/2017 Elsevier Interactive Patient Education  2019 [employer].

## 2018-10-18 NOTE — Progress Notes (Signed)
   Subjective:    Patient ID: Julie Orozco, female    DOB: 09-02-1945, 73 y.o.   MRN: [id number]  HPI Chief Complaint  Patient presents with  . 6 month follow-up    6 month follow-up on health concerns- htn, prediabetes, liver enzymes. wants bone denisty test as well   She is here for a 6 month medication management visit.  Her daughter is with her and is translating.  Her daughter is in the medical field.  Patient declines a medical translator  DEXA 09/14/2016 and it showed osteopenia Taking vitamin D supplement - takes it some days.  Calcium- getting some in her diet. Is taking a women's one a day vitamin most days.  Weight bearing exercises are limited. Denies history of fracture.   Prediabetes- diet has been fair  Hypertension-reports good compliance with medication and no side effects.   Vitamin D deficiency is not currently taking a vitamin D supplement.  Urine leakage- 2 month history of urinary leakage that occurs intermittently when she is standing up or with certain movements.  Denies leakage with cough or sneezing.  Denies history of incontinence previously.  No urinary frequency, urgency.  Denies difficulty emptying her bladder.  Denies fever, chills, dizziness, chest pain, palpitations, shortness of breath, abdominal pain, nausea, vomiting, diarrhea, constipation.   Reviewed allergies, medications, past medical, surgical, family, and social history.   Review of Systems Pertinent positives and negatives in the history of present illness.     Objective:   Physical Exam BP 110/70   Pulse (!) 56   Wt 177 lb 3.2 oz (80.4 kg)   BMI 34.61 kg/m   Alert and oriented and in no distress. Tympanic membranes and canals are normal. Pharyngeal area is normal. Neck is supple without adenopathy or thyromegaly. Cardiac exam shows a regular sinus rhythm without murmurs or gallops. Lungs are clear to auscultation.  Skin is warm and dry.       Assessment & Plan:    Osteopenia, unspecified location - Plan: DG Bone Density  Essential hypertension - Plan: CBC with Differential/Platelet, Comprehensive metabolic panel  Vitamin D deficiency - Plan: VITAMIN D 25 Hydroxy (Vit-D Deficiency, Fractures)  Elevated ALT measurement - Plan: Comprehensive metabolic panel  Prediabetes - Plan: POCT glycosylated hemoglobin (Hb A1C)  Urinary incontinence, unspecified type - Plan: POCT Urinalysis DIP (Proadvantage Device)  At high risk for fracture - Plan: DG Bone Density  Estrogen deficiency - Plan: DG Bone Density  Hematuria, unspecified type - Plan: Urine Culture, Urinalysis, microscopic only  Estrogen deficiency and history of osteopenia- recheck DEXA per guidelines.  Counseling on calcium and vitamin D intake as well as getting weightbearing exercises. Hgb A1c 6.1% counseling on healthy diet HTN-blood pressure well controlled.  Continue on medication Mildly elevated ALT-recheck function HL- taking statin daily and no side effects. LDL improved significantly. Continue this for now.   Urinalysis dipstick: 1+ blood, negative otherwise. Send for microscopy and culture.  Urinary leakage-unclear etiology, suspect stress incontinence.  Discussed doing Kegel exercises.  Will check her urine and follow-up as appropriate.  She will let me know if this is worsening.  Discussed that there is physical therapy for this problem Follow-up pending labs and DEXA

## 2018-10-19 LAB — CBC WITH DIFFERENTIAL/PLATELET
Basophils Absolute: 0.1 10*3/uL (ref 0.0–0.2)
Basos: 1 %
EOS (ABSOLUTE): 0.2 10*3/uL (ref 0.0–0.4)
Eos: 3 %
Hematocrit: 39.6 % (ref 34.0–46.6)
Hemoglobin: 13.6 g/dL (ref 11.1–15.9)
Immature Grans (Abs): 0 10*3/uL (ref 0.0–0.1)
Immature Granulocytes: 1 %
Lymphocytes Absolute: 2.3 10*3/uL (ref 0.7–3.1)
Lymphs: 30 %
MCH: 31.6 pg (ref 26.6–33.0)
MCHC: 34.3 g/dL (ref 31.5–35.7)
MCV: 92 fL (ref 79–97)
Monocytes Absolute: 0.7 10*3/uL (ref 0.1–0.9)
Monocytes: 9 %
Neutrophils Absolute: 4.3 10*3/uL (ref 1.4–7.0)
Neutrophils: 56 %
Platelets: 243 10*3/uL (ref 150–450)
RBC: 4.3 x10E6/uL (ref 3.77–5.28)
RDW: 12.2 % (ref 11.7–15.4)
WBC: 7.6 10*3/uL (ref 3.4–10.8)

## 2018-10-19 LAB — COMPREHENSIVE METABOLIC PANEL
ALT: 34 IU/L — ABNORMAL HIGH (ref 0–32)
AST: 29 IU/L (ref 0–40)
Albumin/Globulin Ratio: 1.3 (ref 1.2–2.2)
Albumin: 4.1 g/dL (ref 3.8–4.8)
Alkaline Phosphatase: 102 IU/L (ref 39–117)
BILIRUBIN TOTAL: 0.4 mg/dL (ref 0.0–1.2)
BUN/Creatinine Ratio: 20 (ref 12–28)
BUN: 11 mg/dL (ref 8–27)
CO2: 25 mmol/L (ref 20–29)
Calcium: 9.5 mg/dL (ref 8.7–10.3)
Chloride: 100 mmol/L (ref 96–106)
Creatinine, Ser: 0.56 mg/dL — ABNORMAL LOW (ref 0.57–1.00)
GFR calc Af Amer: 109 mL/min/{1.73_m2} (ref >59)
GFR calc non Af Amer: 95 mL/min/{1.73_m2} (ref >59)
Globulin, Total: 3.1 g/dL (ref 1.5–4.5)
Glucose: 105 mg/dL — ABNORMAL HIGH (ref 65–99)
Potassium: 4.6 mmol/L (ref 3.5–5.2)
Sodium: 139 mmol/L (ref 134–144)
Total Protein: 7.2 g/dL (ref 6.0–8.5)

## 2018-10-19 LAB — URINALYSIS, MICROSCOPIC ONLY
Casts: NONE SEEN /lpf
WBC, UA: >30 /hpf — AB (ref 0–5)

## 2018-10-19 LAB — VITAMIN D 25 HYDROXY (VIT D DEFICIENCY, FRACTURES): Vit D, 25-Hydroxy: 33 ng/mL (ref 30.0–100.0)

## 2018-10-19 LAB — URINE CULTURE

## 2018-10-20 ENCOUNTER — Other Ambulatory Visit: Payer: Self-pay | Admitting: Internal Medicine

## 2018-10-20 DIAGNOSIS — R319 Hematuria, unspecified: Secondary | ICD-10-CM

## 2018-10-31 ENCOUNTER — Encounter: Payer: Self-pay | Admitting: Family Medicine

## 2018-10-31 ENCOUNTER — Ambulatory Visit: Payer: Medicare Other

## 2018-10-31 ENCOUNTER — Ambulatory Visit (INDEPENDENT_AMBULATORY_CARE_PROVIDER_SITE_OTHER): Payer: Medicare Other | Admitting: Family Medicine

## 2018-10-31 VITALS — BP 126/80 | HR 59 | Temp 98.1°F | Wt 175.2 lb

## 2018-10-31 DIAGNOSIS — K3189 Other diseases of stomach and duodenum: Secondary | ICD-10-CM

## 2018-10-31 MED ORDER — HYOSCYAMINE SULFATE ER 0.375 MG PO TB12: 0.3750 mg | ORAL_TABLET | Freq: Two times a day (BID) | ORAL | 0 refills | Status: DC

## 2018-10-31 NOTE — Progress Notes (Signed)
   Subjective:    Patient ID: Julie Orozco, female    DOB: 1946-02-06, 73 y.o.   MRN: [id number]  HPI She complains of a several day history of difficulty with midepigastric pain with some nausea and vomiting.  The pain usually occurs after she eats something and to a lesser extent after she drinks.  She states that it does tend to come and go.  She has had no diarrhea, fever, chills.  She has not had any symptoms before.   Review of Systems     Objective:   Physical Exam Alert and in no distress.  Bowel sounds are diminished.  No masses.  Slight tenderness in the midepigastric area but no rebound.       Assessment & Plan:  Stomach spasm - Plan: hyoscyamine (LEVBID) 0.375 MG 12 hr tablet Her symptoms are consistent with GI spasm and I will therefore give Levbid.  If she continues have difficulty she will need further evaluation.  She had her daughter as her interpreter and did not seem to understand.

## 2018-11-07 ENCOUNTER — Other Ambulatory Visit (INDEPENDENT_AMBULATORY_CARE_PROVIDER_SITE_OTHER): Payer: Medicare Other

## 2018-11-07 DIAGNOSIS — R319 Hematuria, unspecified: Secondary | ICD-10-CM | POA: Diagnosis not present

## 2018-11-07 LAB — POCT URINALYSIS DIP (PROADVANTAGE DEVICE)
Bilirubin, UA: NEGATIVE
Glucose, UA: NEGATIVE mg/dL
Ketones, POC UA: NEGATIVE mg/dL
Leukocytes, UA: NEGATIVE
Nitrite, UA: NEGATIVE
Protein Ur, POC: NEGATIVE mg/dL
SPECIFIC GRAVITY, URINE: 1.03
Urobilinogen, Ur: NEGATIVE
pH, UA: 6 (ref 5.0–8.0)

## 2018-11-08 ENCOUNTER — Other Ambulatory Visit: Payer: Self-pay | Admitting: Internal Medicine

## 2018-11-08 DIAGNOSIS — R3121 Asymptomatic microscopic hematuria: Secondary | ICD-10-CM

## 2018-11-08 LAB — URINALYSIS, MICROSCOPIC ONLY
Casts: NONE SEEN /lpf
Epithelial Cells (non renal): NONE SEEN /hpf (ref 0–10)

## 2018-12-29 ENCOUNTER — Encounter: Payer: Self-pay | Admitting: Medical

## 2018-12-29 ENCOUNTER — Other Ambulatory Visit: Payer: Self-pay

## 2018-12-29 ENCOUNTER — Ambulatory Visit (INDEPENDENT_AMBULATORY_CARE_PROVIDER_SITE_OTHER): Payer: Medicare Other | Admitting: Medical

## 2018-12-29 VITALS — Wt 175.0 lb

## 2018-12-29 DIAGNOSIS — R42 Dizziness and giddiness: Secondary | ICD-10-CM

## 2018-12-29 DIAGNOSIS — H8110 Benign paroxysmal vertigo, unspecified ear: Secondary | ICD-10-CM | POA: Diagnosis not present

## 2018-12-29 MED ORDER — MECLIZINE HCL 25 MG PO TABS: 25.0000 mg | ORAL_TABLET | Freq: Two times a day (BID) | ORAL | 0 refills | Status: DC

## 2018-12-29 NOTE — Progress Notes (Signed)
This visit type was conducted due to national recommendations for restrictions regarding the COVID-19 Pandemic (e.g. social distancing) in an effort to limit this patient's exposure and mitigate transmission in our community.  Due to their co-morbid illnesses, this patient is at least at moderate risk for complications without adequate follow up.  This format is felt to be most appropriate for this patient at this time.    Documentation for virtual audio and video telecommunications through Zoom encounter:  The patient was located at home. The provider was located in the office. The patient did consent to this visit and is aware of possible charges through their insurance for this visit.  The other persons participating in this telemedicine service were none. Time spent on call was 20 minutes and in review of previous records >25 minutes total.  This virtual service is not related to other E/M service within previous 7 days.   Subjective: Chief Complaint  Patient presents with  . dizzy    dizzy, cant get up spinning X this 5 am   Virtual visit today for dizziness.  Visit was conducted in conjunction with her daughter Violet who interprets.  She speaks Spanish, no [place]  She was in normal state of health yesterday and feeling fine.  This morning about 5 AM felt dizzy just lying in the bed.  She opened her eyes and went to rollover and felt dizzy again.  This continued intermittent for the next several hours.  She finally was able to call her daughter who came over to help check on her.  She feels a little better now but still has some episodes of dizziness like the room is spinning around each time.  Sometimes she notices a little ringing in the ear but not regularly.  No known hearing loss.  She has never had this before.  She feels some fatigue.  Otherwise no other symptoms.  Denies numbness, tingling, slurred speech, headache, vision changes, no numbness tingling or weakness, no  falls, no off balance.  No recent injury or head injury.  No recent illness.  She is compliant with her medications and feels like those issues have been stable.  She denies ear pain.  No upper respiratory symptoms, no allergy problems.  In general sleeps fine although she does use a natural sleep aid at times.  She denies chest pain, shortness of breath, dyspnea on exertion, edema, palpitations.  No other aggravating or relieving factors. No other complaint.   Past Medical History:  Diagnosis Date  . Abnormal uterine bleeding   . Elevated ALT measurement 04/18/2018  . Hypertension   . Obesity   . Osteopenia determined by x-ray   . Prediabetes 04/18/2018  . SVD (spontaneous vaginal delivery)    x 8   Current Outpatient Medications on File Prior to Visit  Medication Sig Dispense Refill  . ibuprofen (ADVIL,MOTRIN) 600 MG tablet Take 1 tablet (600 mg total) by mouth every 6 (six) hours as needed. 30 tablet 1  . lisinopril (PRINIVIL,ZESTRIL) 10 MG tablet Take 1 tablet (10 mg total) by mouth daily. 30 tablet 5  . Nutritional Supplements (VITAMIN D BOOSTER PO) Take by mouth.    . simvastatin (ZOCOR) 10 MG tablet Take 1 tablet (10 mg total) by mouth at bedtime. 90 tablet 1  . hyoscyamine (LEVBID) 0.375 MG 12 hr tablet Take 1 tablet (0.375 mg total) by mouth 2 (two) times daily. (Patient not taking: Reported on 12/29/2018) 20 tablet 0   No current facility-administered  medications on file prior to visit.     ROS as in subjective   Objective: Wt 175 lb (79.4 kg)   BMI 34.18 kg/m   Wt Readings from Last 3 Encounters:  12/29/18 175 lb (79.4 kg)  10/31/18 175 lb 3.2 oz (79.5 kg)  10/18/18 177 lb 3.2 oz (80.4 kg)   BP Readings from Last 3 Encounters:  10/31/18 126/80  10/18/18 110/70  09/29/18 124/80   Gen: wd, wn, nad I did somewhat of a modified exam visually inspecting her doing CN II through XII testing and nerve system testing as best I could over the phone with video and with her  daughter assisting me Romberg negative, and of the testable CN II through XII over the phone she seems to have normal response.  Psych: answers questions appropriately   Assessment: Encounter Diagnoses  Name Primary?  . Benign paroxysmal positional vertigo, unspecified laterality Yes  . Dizziness      Plan: We discussed her symptoms and concerns.  I suspect BPPV.  Begin meclizine below, avoid sudden motions, rest, hydrate well.  Advise she not doing type of work around the house that would cause her to fall until this has resolved.  Advised her daughter check up on her the next few days.  Advised her daughter to pick up her medicine for her to avoid her going out to the store.  She apparently does not drive in general.  Continue regular medications as usual.  Advised her symptoms will likely resolve within the next 3 to 4 days along with medication.  If not much improved within the next 4 days then call back.  If any new symptoms such as unilateral weakness or numbness or tingling or slurred speech or confusion, then consider calling 911.  I wished her happy birthday, since her birthday is tomorrow.  Oriel was seen today for dizzy.  Diagnoses and all orders for this visit:  Benign paroxysmal positional vertigo, unspecified laterality  Dizziness  Other orders -     meclizine (ANTIVERT) 25 MG tablet; Take 1 tablet (25 mg total) by mouth 2 (two) times daily.

## 2019-01-03 ENCOUNTER — Ambulatory Visit: Payer: Medicare Other

## 2019-01-03 ENCOUNTER — Other Ambulatory Visit: Payer: Medicare Other

## 2019-03-06 ENCOUNTER — Ambulatory Visit: Payer: Medicare Other

## 2019-03-06 ENCOUNTER — Other Ambulatory Visit: Payer: Medicare Other

## 2019-03-21 ENCOUNTER — Other Ambulatory Visit: Payer: Self-pay | Admitting: Family Medicine

## 2019-03-21 DIAGNOSIS — I10 Essential (primary) hypertension: Secondary | ICD-10-CM

## 2019-03-21 NOTE — Telephone Encounter (Signed)
Pt is scheduled for august for awv

## 2019-04-26 ENCOUNTER — Ambulatory Visit: Payer: Medicare Other | Admitting: Family Medicine

## 2019-04-27 ENCOUNTER — Ambulatory Visit: Payer: Medicare Other | Admitting: Family Medicine

## 2019-05-15 ENCOUNTER — Ambulatory Visit: Payer: Medicare Other | Admitting: Family Medicine

## 2019-05-15 ENCOUNTER — Ambulatory Visit: Payer: Medicare Other

## 2019-05-15 ENCOUNTER — Other Ambulatory Visit: Payer: Medicare Other

## 2019-05-29 NOTE — Patient Instructions (Addendum)
  [name] , Thank you for taking time to come for your Medicare Wellness Visit. I appreciate your ongoing commitment to your health goals. Please review the following plan we discussed and let me know if I can assist you in the future.   These are the goals we discussed:  Start on the Metformin once daily with breakfast.  Cut back on carbohydrates and sweets and increase physical activity.    This is a list of the screening recommended for you and due dates:  Health Maintenance  Topic Date Due  . Colon Cancer Screening  12/29/1997  . Flu Shot  03/31/2019  . Mammogram  10/15/2019  . Tetanus Vaccine  08/30/2021  . DEXA scan (bone density measurement)  Completed  .  Hepatitis C: One time screening is recommended by Center for Disease Control  (CDC) for  adults born from [birth year] through 1963.   Completed  . Pneumonia vaccines  Completed

## 2019-05-29 NOTE — Progress Notes (Signed)
Julie Orozco is a 73 y.o. female who presents for annual wellness visit and follow-up on chronic medical conditions.   She does not speak [place] and her daughter is with her to interpret. Declines a medical interpreter.   She has the following concerns:  Denies any concerns. States she feels well.   Prediabetes history however, today her Hgb A1c is 7.0% and she now has diabetes.  States she has been eating more carbohydrates. Has been sedentary.    HTN- taking lisinopril daily without any concerns.   HL- has been out of her statin for the past month or longer.   Osteopenia- is scheduled for DEXA. Taking vitamin D  Vitamin D def- taking a supplement.   History of PMB and had a D&C with Dr. Hulan Fray in 2015. Denies any bleeding.    Immunization History  Administered Date(s) Administered  . Influenza, High Dose Seasonal PF 06/02/2016, 05/25/2017, 08/08/2018  . Pneumococcal Conjugate-13 06/14/2016  . Pneumococcal Polysaccharide-23 11/02/2017  . Tdap 08/31/2011   Last Pap smear: Dr. Hulan Fray in 2014 and 2015.  Last mammogram: scheduled in November 2020 Last colonoscopy:  cologuard couldn't be processed back in 2019 and has never done cologuard or colonscopy  Last DEXA: 08/2016 and osteopenia. Has next one scheduled in November 2020 Dentist: has dentures upper and lower  Ophtho: 3 years ago Exercise: none  Other doctors caring for patient include: None    Depression screen:  See questionnaire below.  Depression screen [facility] 2/9 05/30/2019 04/17/2018 06/14/2016  Decreased Interest 0 0 0  Down, Depressed, Hopeless 0 0 0  PHQ - 2 Score 0 0 0    Fall Risk Screen: see questionnaire below. Fall Risk  05/30/2019 04/17/2018 06/14/2016  Falls in the past year? 0 No No  Number falls in past yr: 0 - -  Injury with Fall? 0 - -    ADL screen:  See questionnaire below Functional Status Survey: Is the patient deaf or have difficulty hearing?: No Does the patient have difficulty  seeing, even when wearing glasses/contacts?: Yes Does the patient have difficulty concentrating, remembering, or making decisions?: No Does the patient have difficulty walking or climbing stairs?: No Does the patient have difficulty dressing or bathing?: No Does the patient have difficulty doing errands alone such as visiting a doctor's office or shopping?: No   End of Life Discussion:  Patient does not have a living will and medical power of attorney. She was given advance directive paperwork again this year.   Review of Systems Constitutional: -fever, -chills, -sweats, -unexpected weight change, -anorexia, -fatigue Allergy: -sneezing, -itching, -congestion Dermatology: denies changing moles, rash, lumps, new worrisome lesions ENT: -runny nose, -ear pain, -sore throat, -hoarseness, -sinus pain, -teeth pain, -tinnitus, -hearing loss, -epistaxis Cardiology:  -chest pain, -palpitations, -edema, -orthopnea, -paroxysmal nocturnal dyspnea Respiratory: -cough, -shortness of breath, -dyspnea on exertion, -wheezing, -hemoptysis Gastroenterology: -abdominal pain, -nausea, -vomiting, -diarrhea, -constipation, -blood in stool, -changes in bowel movement, -dysphagia Hematology: -bleeding or bruising problems Musculoskeletal: -arthralgias, -myalgias, -joint swelling, -back pain, -neck pain, -cramping, -gait changes Ophthalmology: -vision changes, -eye redness, -itching, -discharge Urology: -dysuria, -difficulty urinating, -hematuria, -urinary frequency, -urgency, incontinence Neurology: -headache, -weakness, -tingling, -numbness, -speech abnormality, -memory loss, -falls, -dizziness Psychology:  -depressed mood, -agitation, -sleep problems    PHYSICAL EXAM:  BP 110/60   Pulse (!) 59   Temp 98.4 F (36.9 C)   Ht 5' 0.25" (1.53 m)   Wt 176 lb (79.8 kg)   BMI 34.09 kg/m   General Appearance:  Alert, cooperative, no distress, appears stated age Head: Normocephalic, without obvious abnormality,  atraumatic Eyes: PERRL, conjunctiva/corneas clear, EOM's intact, fundi benign Ears: Normal TM's and external ear canals Nose: mask in place  Throat: mask in place  Neck: Supple, no lymphadenopathy; thyroid: no enlargement/tenderness/nodules Back: Spine nontender, no curvature, ROM normal, no CVA tenderness Lungs: Clear to auscultation bilaterally without wheezes, rales or ronchi; respirations unlabored Chest Wall: No tenderness or deformity Heart: Regular rate and rhythm, S1 and S2 normal, no murmur, rub or gallop Breast Exam: declines  Abdomen: Soft, non-tender, nondistended, normoactive bowel sounds, no masses, no hepatosplenomegaly Genitalia: declines  Extremities: No clubbing, cyanosis or edema Pulses: 2+ and symmetric all extremities Skin: Skin color, texture, turgor normal, no rashes or lesions Lymph nodes: Cervical, supraclavicular, and axillary nodes normal Neurologic: CNII-XII intact, normal strength, sensation and gait; reflexes 2+ and symmetric throughout Psych: Normal mood, affect, hygiene and grooming.  ASSESSMENT/PLAN: Medicare annual wellness visit, subsequent -no sign of falls, depression or memory issues. Able to manage all ADLs. Discussed advance directives, immunizations, and safety.   Essential hypertension - Plan: CBC with Differential/Platelet, Comprehensive metabolic panel -BP is in goal range. Continue medication.   Elevated ALT measurement -recheck liver function and follow up  Vitamin D deficiency - Plan: VITAMIN D 25 Hydroxy (Vit-D Deficiency, Fractures) -check vitamin d level and follow up   Obesity (BMI 30-39.9) -encouraged making healthy diet changes and increasing physical activity  New onset type 2 diabetes mellitus ([place]) - Plan: metFORMIN (GLUCOPHAGE) 500 MG tablet, CBC with Differential/Platelet, Comprehensive metabolic panel, TSH, T4, free, T3, Lipid panel, POCT glycosylated hemoglobin (Hb A1C), Insulin and C-Peptide  Osteopenia, unspecified  location -continue vitamin D. Scheduled for DEXA for 2 year follow up.   Medication management - Plan: VITAMIN D 25 Hydroxy (Vit-D Deficiency, Fractures), Lipid panel  Screen for colon cancer - Plan: Cologuard -follow up pending result   Discussed monthly self breast exams and yearly mammograms; at least 30 minutes of aerobic activity at least 5 days/week and weight-bearing exercise 2x/week; proper sunscreen use reviewed; healthy diet, including goals of calcium and vitamin D intake and alcohol recommendations (less than or equal to 1 drink/day) reviewed; regular seatbelt use; changing batteries in smoke detectors.  Immunization recommendations discussed.  Colonoscopy recommendations reviewed   Medicare Attestation I have personally reviewed: The patient's medical and social history Their use of alcohol, tobacco or illicit drugs Their current medications and supplements The patient's functional ability including ADLs,fall risks, home safety risks, cognitive, and hearing and visual impairment Diet and physical activities Evidence for depression or mood disorders  The patient's weight, height, and BMI have been recorded in the chart.  I have made referrals, counseling, and provided education to the patient based on review of the above and I have provided the patient with a written personalized care plan for preventive services.     Harland Dingwall, NP-C   05/30/2019

## 2019-05-30 ENCOUNTER — Encounter: Payer: Self-pay | Admitting: Family Medicine

## 2019-05-30 ENCOUNTER — Ambulatory Visit (INDEPENDENT_AMBULATORY_CARE_PROVIDER_SITE_OTHER): Payer: Medicare Other | Admitting: Family Medicine

## 2019-05-30 ENCOUNTER — Other Ambulatory Visit: Payer: Self-pay

## 2019-05-30 ENCOUNTER — Telehealth: Payer: Self-pay | Admitting: Internal Medicine

## 2019-05-30 VITALS — BP 110/60 | HR 59 | Temp 98.4°F | Ht 60.25 in | Wt 176.0 lb

## 2019-05-30 DIAGNOSIS — M8580 Other specified disorders of bone density and structure, unspecified site: Secondary | ICD-10-CM | POA: Insufficient documentation

## 2019-05-30 DIAGNOSIS — I10 Essential (primary) hypertension: Secondary | ICD-10-CM | POA: Diagnosis not present

## 2019-05-30 DIAGNOSIS — Z79899 Other long term (current) drug therapy: Secondary | ICD-10-CM | POA: Diagnosis not present

## 2019-05-30 DIAGNOSIS — E559 Vitamin D deficiency, unspecified: Secondary | ICD-10-CM

## 2019-05-30 DIAGNOSIS — E119 Type 2 diabetes mellitus without complications: Secondary | ICD-10-CM | POA: Insufficient documentation

## 2019-05-30 DIAGNOSIS — Z0000 Encounter for general adult medical examination without abnormal findings: Secondary | ICD-10-CM | POA: Diagnosis not present

## 2019-05-30 DIAGNOSIS — Z1211 Encounter for screening for malignant neoplasm of colon: Secondary | ICD-10-CM

## 2019-05-30 DIAGNOSIS — R7401 Elevation of levels of liver transaminase levels: Secondary | ICD-10-CM

## 2019-05-30 DIAGNOSIS — E669 Obesity, unspecified: Secondary | ICD-10-CM | POA: Diagnosis not present

## 2019-05-30 DIAGNOSIS — R740 Nonspecific elevation of levels of transaminase and lactic acid dehydrogenase [LDH]: Secondary | ICD-10-CM

## 2019-05-30 LAB — POCT GLYCOSYLATED HEMOGLOBIN (HGB A1C): Hemoglobin A1C: 7 % — AB (ref 4.0–5.6)

## 2019-05-30 MED ORDER — METFORMIN HCL 500 MG PO TABS: 500.0000 mg | ORAL_TABLET | Freq: Every day | ORAL | 1 refills | Status: DC

## 2019-05-30 NOTE — Telephone Encounter (Signed)

## 2019-05-31 LAB — COMPREHENSIVE METABOLIC PANEL
ALT: 50 IU/L — ABNORMAL HIGH (ref 0–32)
AST: 46 IU/L — ABNORMAL HIGH (ref 0–40)
Albumin/Globulin Ratio: 1.3 (ref 1.2–2.2)
Albumin: 4.3 g/dL (ref 3.7–4.7)
Alkaline Phosphatase: 112 IU/L (ref 39–117)
BUN/Creatinine Ratio: 20 (ref 12–28)
BUN: 12 mg/dL (ref 8–27)
Bilirubin Total: 0.4 mg/dL (ref 0.0–1.2)
CO2: 22 mmol/L (ref 20–29)
Calcium: 9.5 mg/dL (ref 8.7–10.3)
Chloride: 100 mmol/L (ref 96–106)
Creatinine, Ser: 0.59 mg/dL (ref 0.57–1.00)
GFR calc Af Amer: 107 mL/min/{1.73_m2} (ref >59)
GFR calc non Af Amer: 93 mL/min/{1.73_m2} (ref >59)
Globulin, Total: 3.2 g/dL (ref 1.5–4.5)
Glucose: 99 mg/dL (ref 65–99)
Potassium: 4.8 mmol/L (ref 3.5–5.2)
Sodium: 141 mmol/L (ref 134–144)
Total Protein: 7.5 g/dL (ref 6.0–8.5)

## 2019-05-31 LAB — TSH: TSH: 2.39 u[IU]/mL (ref 0.450–4.500)

## 2019-05-31 LAB — LIPID PANEL
Chol/HDL Ratio: 3.6 ratio (ref 0.0–4.4)
Cholesterol, Total: 185 mg/dL (ref 100–199)
HDL: 51 mg/dL (ref >39)
LDL Chol Calc (NIH): 112 mg/dL — ABNORMAL HIGH (ref 0–99)
Triglycerides: 125 mg/dL (ref 0–149)
VLDL Cholesterol Cal: 22 mg/dL (ref 5–40)

## 2019-05-31 LAB — CBC WITH DIFFERENTIAL/PLATELET
Basophils Absolute: 0.1 10*3/uL (ref 0.0–0.2)
Basos: 1 %
EOS (ABSOLUTE): 0.2 10*3/uL (ref 0.0–0.4)
Eos: 2 %
Hematocrit: 38.8 % (ref 34.0–46.6)
Hemoglobin: 13.8 g/dL (ref 11.1–15.9)
Immature Grans (Abs): 0 10*3/uL (ref 0.0–0.1)
Immature Granulocytes: 0 %
Lymphocytes Absolute: 2.8 10*3/uL (ref 0.7–3.1)
Lymphs: 32 %
MCH: 31.2 pg (ref 26.6–33.0)
MCHC: 35.6 g/dL (ref 31.5–35.7)
MCV: 88 fL (ref 79–97)
Monocytes Absolute: 0.7 10*3/uL (ref 0.1–0.9)
Monocytes: 8 %
Neutrophils Absolute: 5 10*3/uL (ref 1.4–7.0)
Neutrophils: 57 %
Platelets: 266 10*3/uL (ref 150–450)
RBC: 4.43 x10E6/uL (ref 3.77–5.28)
RDW: 11.7 % (ref 11.7–15.4)
WBC: 8.8 10*3/uL (ref 3.4–10.8)

## 2019-05-31 LAB — INSULIN AND C-PEPTIDE, SERUM
C-Peptide: 3.8 ng/mL (ref 1.1–4.4)
INSULIN: 25.6 u[IU]/mL — ABNORMAL HIGH (ref 2.6–24.9)

## 2019-05-31 LAB — VITAMIN D 25 HYDROXY (VIT D DEFICIENCY, FRACTURES): Vit D, 25-Hydroxy: 25.9 ng/mL — ABNORMAL LOW (ref 30.0–100.0)

## 2019-05-31 LAB — T4, FREE: Free T4: 1.38 ng/dL (ref 0.82–1.77)

## 2019-05-31 LAB — T3: T3, Total: 155 ng/dL (ref 71–180)

## 2019-06-01 ENCOUNTER — Other Ambulatory Visit: Payer: Self-pay | Admitting: Internal Medicine

## 2019-06-01 MED ORDER — SIMVASTATIN 10 MG PO TABS: 10.0000 mg | ORAL_TABLET | Freq: Every day | ORAL | 1 refills | Status: DC

## 2019-06-11 ENCOUNTER — Telehealth: Payer: Self-pay | Admitting: Family Medicine

## 2019-06-11 DIAGNOSIS — I10 Essential (primary) hypertension: Secondary | ICD-10-CM

## 2019-06-11 MED ORDER — LISINOPRIL 10 MG PO TABS: 10.0000 mg | ORAL_TABLET | Freq: Every day | ORAL | 5 refills | Status: DC

## 2019-06-11 NOTE — Telephone Encounter (Signed)
Sent in med and volieta was notified

## 2019-06-11 NOTE — Telephone Encounter (Signed)
  Needs refill on lisinopril  Daughter states they have been trying to get it refilled since her last appointment but still is out of meds  Daughter, Lavone Neri asked to be notified when it is sent in so they can make sure she gets it filled

## 2019-06-11 NOTE — Telephone Encounter (Signed)
Please handle this. Thanks.

## 2019-06-27 ENCOUNTER — Other Ambulatory Visit: Payer: Self-pay

## 2019-06-27 ENCOUNTER — Encounter: Payer: Self-pay | Admitting: Family Medicine

## 2019-06-27 ENCOUNTER — Ambulatory Visit (INDEPENDENT_AMBULATORY_CARE_PROVIDER_SITE_OTHER): Payer: Medicare Other | Admitting: Family Medicine

## 2019-06-27 VITALS — BP 120/80 | HR 61 | Temp 98.4°F | Wt 172.2 lb

## 2019-06-27 DIAGNOSIS — E559 Vitamin D deficiency, unspecified: Secondary | ICD-10-CM

## 2019-06-27 DIAGNOSIS — E785 Hyperlipidemia, unspecified: Secondary | ICD-10-CM | POA: Insufficient documentation

## 2019-06-27 DIAGNOSIS — E7800 Pure hypercholesterolemia, unspecified: Secondary | ICD-10-CM | POA: Diagnosis not present

## 2019-06-27 DIAGNOSIS — E119 Type 2 diabetes mellitus without complications: Secondary | ICD-10-CM

## 2019-06-27 DIAGNOSIS — R748 Abnormal levels of other serum enzymes: Secondary | ICD-10-CM | POA: Insufficient documentation

## 2019-06-27 DIAGNOSIS — E1169 Type 2 diabetes mellitus with other specified complication: Secondary | ICD-10-CM | POA: Insufficient documentation

## 2019-06-27 HISTORY — DX: Abnormal levels of other serum enzymes: R74.8

## 2019-06-27 MED ORDER — ACCU-CHEK GUIDE VI STRP: ORAL_STRIP | 1 refills | Status: DC

## 2019-06-27 MED ORDER — ACCU-CHEK FASTCLIX LANCETS MISC: 1 refills | Status: DC

## 2019-06-27 NOTE — Progress Notes (Signed)
   Subjective:    Patient ID: Julie Orozco, female    DOB: 1945/09/01, 73 y.o.   MRN: [id number]  HPI Chief Complaint  Patient presents with  . 4 week follow-up on labs    4 week follow-up on labs   Here with her daughter who is helping translate to follow up on new diabetes type 2. Previously had been in prediabetes range. Hgb A1c 7.0% 4 weeks ago.  Started on Metformin 500 mg twice daily. States she is doing well on the medication, no concerns. Has not started checking her BS yet but is interested in doing so.   States she has cut back on portion sizes, specifically on sweet bread.  Denies having sugar in beverages.  Walking for 30 minutes 5 days per week.   Elevated liver enzymes. Needs follow up. Does not drink alcohol or take NSAIDs regularly.   Vitamin D deficiency- taking vitamin D 1,000 IUs daily.   Elevated LDL but she had been out of simvastatin for several weeks. Started back on statin therapy 3 weeks ago only.   Denies fever, chills, dizziness, chest pain, palpitations, shortness of breath, abdominal pain, N/V/D, urinary symptoms.   Reviewed allergies, medications, past medical, surgical, family, and social history.    Review of Systems Pertinent positives and negatives in the history of present illness.     Objective:   Physical Exam BP 120/80   Pulse 61   Temp 98.4 F (36.9 C)   Wt 172 lb 3.2 oz (78.1 kg)   BMI 33.35 kg/m   Alert and oriented and in no acute distress.       Assessment & Plan:  New onset type 2 diabetes mellitus ([place]) -previously prediabetes and then 4 weeks ago a Hgb  A1c 7.0% was found. She is doing well on Metformin 500 mg twice daily. She has made healthy dietary changes and increased physical activity. A glucose meter was given and she was taught how to check her BS, when to check them and goal ranges.  Advised to continue on statin. She is on lisinopril.  Recommend a diabetic eye exam.   Vitamin D deficiency- continue on  1,000 IUs of vitamin D daily.   Elevated LDL cholesterol level- started back on statin 3 weeks ago. Will recheck lipids at follow up  Elevated liver enzymes - Plan: Hepatic Function Panel, US Abdomen Limited RUQ -persistently elevated with no obvious etiology. Discussed possible fatty liver disease. Negative acute hepatitis panel in 03/2018. Will send her for RUQ [place]. Consider referral to GI.

## 2019-06-27 NOTE — Patient Instructions (Signed)
Start checking your blood sugars either fasting (first thing in the morning) or 2 hours after a meal.  Goal fasting blood sugar is 80-130 Goal 2-hour blood sugar is 130-160.  If you see readings consistently higher than let me know  Cut back on sweets and carbohydrates such as bread, pasta, rice, potatoes Continue walking.  Continue on the metformin twice daily with meals.  Continue on the simvastatin daily  Call and schedule a diabetic eye exam  Continue on the vitamin D 1000 IUs daily  Return to see me in 3 months and please bring in your blood sugar readings

## 2019-06-28 LAB — HEPATIC FUNCTION PANEL
ALT: 38 IU/L — ABNORMAL HIGH (ref 0–32)
AST: 39 IU/L (ref 0–40)
Albumin: 4.4 g/dL (ref 3.7–4.7)
Alkaline Phosphatase: 104 IU/L (ref 39–117)
Bilirubin Total: 0.5 mg/dL (ref 0.0–1.2)
Bilirubin, Direct: 0.18 mg/dL (ref 0.00–0.40)
Total Protein: 7.4 g/dL (ref 6.0–8.5)

## 2019-07-02 ENCOUNTER — Ambulatory Visit
Admission: RE | Admit: 2019-07-02 | Discharge: 2019-07-02 | Disposition: A | Discharge status: Home / Self Care | Payer: Medicare Other | Source: Ambulatory Visit | Attending: Family Medicine | Admitting: Family Medicine

## 2019-07-02 ENCOUNTER — Other Ambulatory Visit: Payer: Self-pay | Admitting: Internal Medicine

## 2019-07-02 DIAGNOSIS — R935 Abnormal findings on diagnostic imaging of other abdominal regions, including retroperitoneum: Secondary | ICD-10-CM

## 2019-07-02 DIAGNOSIS — R748 Abnormal levels of other serum enzymes: Secondary | ICD-10-CM

## 2019-07-02 DIAGNOSIS — I10 Essential (primary) hypertension: Secondary | ICD-10-CM | POA: Diagnosis not present

## 2019-07-02 DIAGNOSIS — R7989 Other specified abnormal findings of blood chemistry: Secondary | ICD-10-CM | POA: Diagnosis not present

## 2019-07-03 ENCOUNTER — Encounter: Payer: Self-pay | Admitting: Gastroenterology

## 2019-07-10 ENCOUNTER — Telehealth: Payer: Self-pay

## 2019-07-10 MED ORDER — ACCU-CHEK FASTCLIX LANCETS MISC: 1 refills | Status: DC

## 2019-07-10 MED ORDER — ACCU-CHEK GUIDE VI STRP: ORAL_STRIP | 1 refills | Status: DC

## 2019-07-10 NOTE — Telephone Encounter (Signed)
Pharmacy called stating that the pt. Prescriptions for there lancets and there test strips needed to be resent with a diagnosis code attached to both of them insurance will not cover unless it has diagnosis codes.

## 2019-07-10 NOTE — Telephone Encounter (Signed)
Sent in med again with dx code

## 2019-07-30 ENCOUNTER — Other Ambulatory Visit: Payer: Self-pay

## 2019-07-30 ENCOUNTER — Ambulatory Visit
Admission: RE | Admit: 2019-07-30 | Discharge: 2019-07-30 | Disposition: A | Discharge status: Home / Self Care | Payer: Medicare Other | Source: Ambulatory Visit | Attending: Family Medicine | Admitting: Family Medicine

## 2019-07-30 DIAGNOSIS — Z1231 Encounter for screening mammogram for malignant neoplasm of breast: Secondary | ICD-10-CM

## 2019-07-30 DIAGNOSIS — M8580 Other specified disorders of bone density and structure, unspecified site: Secondary | ICD-10-CM

## 2019-07-30 DIAGNOSIS — Z780 Asymptomatic menopausal state: Secondary | ICD-10-CM | POA: Diagnosis not present

## 2019-07-30 DIAGNOSIS — E2839 Other primary ovarian failure: Secondary | ICD-10-CM

## 2019-07-30 DIAGNOSIS — M8589 Other specified disorders of bone density and structure, multiple sites: Secondary | ICD-10-CM | POA: Diagnosis not present

## 2019-07-30 DIAGNOSIS — Z9189 Other specified personal risk factors, not elsewhere classified: Secondary | ICD-10-CM

## 2019-08-07 ENCOUNTER — Encounter: Payer: Self-pay | Admitting: Gastroenterology

## 2019-08-07 ENCOUNTER — Other Ambulatory Visit: Payer: Self-pay

## 2019-08-07 ENCOUNTER — Ambulatory Visit (INDEPENDENT_AMBULATORY_CARE_PROVIDER_SITE_OTHER): Payer: Medicare Other | Admitting: Gastroenterology

## 2019-08-07 VITALS — BP 118/70 | HR 65 | Temp 97.1°F | Ht 60.0 in | Wt 170.2 lb

## 2019-08-07 DIAGNOSIS — K760 Fatty (change of) liver, not elsewhere classified: Secondary | ICD-10-CM | POA: Diagnosis not present

## 2019-08-07 DIAGNOSIS — Z1211 Encounter for screening for malignant neoplasm of colon: Secondary | ICD-10-CM

## 2019-08-07 MED ORDER — NA SULFATE-K SULFATE-MG SULF 17.5-3.13-1.6 GM/177ML PO SOLN: 1.0000 | Freq: Once | ORAL | 0 refills | Status: AC

## 2019-08-07 NOTE — Patient Instructions (Addendum)
If you are age 71 or older, your body mass index should be between 23-30. Your Body mass index is 33.24 kg/m. If this is out of the aforementioned range listed, please consider follow up with your Primary Care Provider.  If you are age 17 or younger, your body mass index should be between 19-25. Your Body mass index is 33.24 kg/m. If this is out of the aformentioned range listed, please consider follow up with your Primary Care Provider.   You have been scheduled for a colonoscopy. Please follow written instructions given to you at your visit today.  Please pick up your prep supplies at the pharmacy within the next 1-3 days. If you use inhalers (even only as needed), please bring them with you on the day of your procedure.  Due to recent COVID-19 restrictions implemented by [employer] and state authorities and in an effort to keep both patients and staff as safe as possible, [facility] requires COVID-19 testing prior to any scheduled endoscopic procedure. The testing center is located at [address] [place], [place] [address] in the [facility] [employer]  suite.  Your appointment has been scheduled for 09-03-2019 at 10am.   Please bring your insurance cards to this appointment. You will require your COVID screen 2 business days prior to your endoscopic procedure.  You are not required to quarantine after your screening.  You will only receive a phone call with the results if it is POSITIVE.  If you do not receive a call the day before your procedure you should begin your prep, if ordered, and you should report to the endo center for your procedure at your designated appointment arrival time ( one hour prior to the procedure time). There is no cost to you for the screening on the day of the swab.  [facility] will file with your insurance company for the testing.    You may receive an automated phone call prior to your procedure or have  a message in your MyChart that you have an appointment for a BP/15 at the [facility], please disregard this message.  Your testing will be at the [address] [place] location.   If you are leaving [facility] travel [place] on [address], turn left onto [address], turn night onto [address] at the 1st stop light turn right, pass the [employer] on your right and proceed to [address] (white building).   It was a pleasure to see you today!  Dr. Loletha Carrow

## 2019-08-07 NOTE — Progress Notes (Signed)
[facility] Consult Note:  History: Julie Orozco 08/07/2019  Referring provider: Girtha Rm, NP-C  Reason for consult/chief complaint: Abnormal ultrasound   Subjective  HPI:  This is a very pleasant 73 year old woman referred by primary care and seen with a Spanish interpreter, who was present for the entire encounter. Julie Orozco was referred for elevated LFTs and abnormal findings on ultrasound. She feels well, denies abdominal pain, altered bowel habits, rectal bleeding, dysphagia, odynophagia nausea or vomiting.   Recently diagnosed with diabetes, it does not sound like she has been following low carbohydrate diet.  She tells me that she knows that she should, but just likes bread too much.  She also has not had any prior colon cancer screening.  ROS:  Review of Systems  Constitutional: Negative for appetite change and unexpected weight change.  HENT: Negative for mouth sores and voice change.   Eyes: Negative for pain and redness.  Respiratory: Negative for cough and shortness of breath.   Cardiovascular: Negative for chest pain and palpitations.  Genitourinary: Negative for dysuria and hematuria.  Musculoskeletal: Negative for arthralgias and myalgias.  Skin: Negative for pallor and rash.  Neurological: Negative for weakness and headaches.  Hematological: Negative for adenopathy.     Past Medical History: Past Medical History:  Diagnosis Date  . Abnormal uterine bleeding   . Elevated ALT measurement 04/18/2018  . Hypertension   . Obesity   . Osteopenia determined by x-ray   . Prediabetes 04/18/2018  . SVD (spontaneous vaginal delivery)    x 8     Past Surgical History: Past Surgical History:  Procedure Laterality Date  . DILATION AND CURETTAGE OF UTERUS N/A 01/25/2014   Procedure: DILATATION AND CURETTAGE;  Surgeon: Emily Filbert, MD;  Location: [facility];  Service: Gynecology;  Laterality: N/A;  . MULTIPLE TOOTH EXTRACTIONS     . TUBAL LIGATION       Family History: Family History  Problem Relation Age of Onset  . Hypertension Mother   . Alcohol abuse Father   . Anemia Brother   . Alcohol abuse Brother     Social History: Social History   Socioeconomic History  . Marital status: Divorced    Spouse name: Not on file  . Number of children: Not on file  . Years of education: Not on file  . Highest education level: Not on file  Occupational History  . Not on file  Social Needs  . Financial resource strain: Not on file  . Food insecurity    Worry: Not on file    Inability: Not on file  . Transportation needs    Medical: Not on file    Non-medical: Not on file  Tobacco Use  . Smoking status: Never Smoker  . Smokeless tobacco: Never Used  Substance and Sexual Activity  . Alcohol use: No  . Drug use: No  . Sexual activity: Never    Birth control/protection: Post-menopausal  Lifestyle  . Physical activity    Days per week: Not on file    Minutes per session: Not on file  . Stress: Not on file  Relationships  . Social Herbalist on phone: Not on file    Gets together: Not on file    Attends religious service: Not on file    Active member of club or organization: Not on file    Attends meetings of clubs or organizations: Not on file    Relationship status: Not on file  Other Topics Concern  . Not on file  Social History Narrative  . Not on file    Allergies: No Known Allergies  Outpatient Meds: Current Outpatient Medications  Medication Sig Dispense Refill  . Accu-Chek FastClix Lancets MISC Test once a day. Pt uses accu-chek guide me meter dx e11.9 100 each 1  . glucose blood (ACCU-CHEK GUIDE) test strip Test once a day. Pt used accu-chek guide meter dx E11.9 100 each 1  . ibuprofen (ADVIL,MOTRIN) 600 MG tablet Take 1 tablet (600 mg total) by mouth every 6 (six) hours as needed. 30 tablet 1  . lisinopril (ZESTRIL) 10 MG tablet Take 1 tablet (10 mg total) by mouth daily.  30 tablet 5  . metFORMIN (GLUCOPHAGE) 500 MG tablet Take 1 tablet (500 mg total) by mouth daily with breakfast. 90 tablet 1  . Nutritional Supplements (VITAMIN D BOOSTER PO) Take by mouth.    . simvastatin (ZOCOR) 10 MG tablet Take 1 tablet (10 mg total) by mouth at bedtime. 90 tablet 1   No current facility-administered medications for this visit.       ___________________________________________________________________ Objective   Exam:  BP 118/70   Pulse 65   Temp (!) 97.1 F (36.2 C)   Ht 5' (1.524 m)   Wt 170 lb 3.2 oz (77.2 kg)   BMI 33.24 kg/m    General: Well-appearing  Eyes: sclera anicteric, no redness  ENT: oral mucosa moist without lesions, no cervical or supraclavicular lymphadenopathy  CV: RRR without murmur, S1/S2, no JVD, trace pretibial edema  Resp: clear to auscultation bilaterally, normal RR and effort noted  GI: soft, no tenderness, with active bowel sounds. No guarding or palpable organomegaly noted.  Skin; warm and dry, no rash or jaundice noted  Neuro: awake, alert and oriented x 3. Normal gross motor function and fluent speech  Labs:  CMP Latest Ref Rng & Units 06/27/2019 05/30/2019 10/18/2018  Glucose 65 - 99 mg/dL - 99 105(H)  BUN 8 - 27 mg/dL - 12 11  Creatinine 0.57 - 1.00 mg/dL - 0.59 0.56(L)  Sodium 134 - 144 mmol/L - 141 139  Potassium 3.5 - 5.2 mmol/L - 4.8 4.6  Chloride 96 - 106 mmol/L - 100 100  CO2 20 - 29 mmol/L - 22 25  Calcium 8.7 - 10.3 mg/dL - 9.5 9.5  Total Protein 6.0 - 8.5 g/dL 7.4 7.5 7.2  Total Bilirubin 0.0 - 1.2 mg/dL 0.5 0.4 0.4  Alkaline Phos 39 - 117 IU/L 104 112 102  AST 0 - 40 IU/L 39 46(H) 29  ALT 0 - 32 IU/L 38(H) 50(H) 34(H)   CBC Latest Ref Rng & Units 05/30/2019 10/18/2018 04/17/2018  WBC 3.4 - 10.8 x10E3/uL 8.8 7.6 8.2  Hemoglobin 11.1 - 15.9 g/dL 13.8 13.6 14.2  Hematocrit 34.0 - 46.6 % 38.8 39.6 42.2  Platelets 150 - 450 x10E3/uL 266 243 267   Negative acute hepatitis panel 03/2018  Hgb A1C 7.0 on  05/30/19  Radiologic Studies:  CLINICAL DATA:  Elevated LFTs, hypertension   EXAM: ULTRASOUND ABDOMEN LIMITED RIGHT UPPER QUADRANT   COMPARISON:  None   FINDINGS: Gallbladder:   Normally distended without stones or wall thickening. No pericholecystic fluid or sonographic Murphy sign.   Common bile duct:   Diameter: 5 mm, normal   Liver:   Heterogeneous increased echogenicity, likely fatty infiltration of this can be seen with cirrhosis and certain infiltrative disorders. No focal hepatic mass or nodularity. Portal vein is patent on color Doppler imaging  with normal direction of blood flow towards the liver.   Other: No RIGHT upper quadrant free fluid   IMPRESSION: Probable fatty infiltration of liver as above.   Otherwise negative exam.     Electronically Signed   By: Lavonia Dana M.D.   On: 07/02/2019 09:43   Assessment: Encounter Diagnoses  Name Primary?  . Non-alcoholic fatty liver disease Yes  . Special screening for malignant neoplasms, colon     We discussed the nature of NAFLD, need for regular exercise, weight loss, low carbohydrate diet.  This would lower the risk of eventual progression to cirrhosis.  She also needs colon cancer screening.  We discussed colonoscopy including risks and benefits and she was agreeable. Plan:  Screening colonoscopy  Thank you for the courtesy of this consult.  Please call me with any questions or concerns.  Nelida Meuse III  CC: Referring provider noted above

## 2019-09-05 ENCOUNTER — Encounter: Payer: Medicare Other | Admitting: Gastroenterology

## 2019-09-10 ENCOUNTER — Ambulatory Visit (INDEPENDENT_AMBULATORY_CARE_PROVIDER_SITE_OTHER): Payer: Medicare Other

## 2019-09-10 ENCOUNTER — Other Ambulatory Visit: Payer: Self-pay | Admitting: Gastroenterology

## 2019-09-10 DIAGNOSIS — Z1159 Encounter for screening for other viral diseases: Secondary | ICD-10-CM

## 2019-09-11 LAB — SARS CORONAVIRUS 2 (TAT 6-24 HRS): SARS Coronavirus 2: NEGATIVE

## 2019-09-12 ENCOUNTER — Encounter: Payer: Self-pay | Admitting: Gastroenterology

## 2019-09-12 ENCOUNTER — Other Ambulatory Visit: Payer: Self-pay

## 2019-09-12 ENCOUNTER — Ambulatory Visit (AMBULATORY_SURGERY_CENTER): Payer: Medicare Other | Admitting: Gastroenterology

## 2019-09-12 VITALS — BP 121/54 | HR 49 | Temp 96.6°F | Resp 10 | Ht 60.0 in | Wt 170.0 lb

## 2019-09-12 DIAGNOSIS — Z1211 Encounter for screening for malignant neoplasm of colon: Secondary | ICD-10-CM

## 2019-09-12 DIAGNOSIS — D123 Benign neoplasm of transverse colon: Secondary | ICD-10-CM

## 2019-09-12 DIAGNOSIS — D122 Benign neoplasm of ascending colon: Secondary | ICD-10-CM

## 2019-09-12 MED ORDER — SODIUM CHLORIDE 0.9 % IV SOLN: 500.0000 mL | Freq: Once | INTRAVENOUS | Status: DC

## 2019-09-12 NOTE — Progress Notes (Signed)
Called to room to assist during endoscopic procedure.  Patient ID and intended procedure confirmed with present staff. Received instructions for my participation in the procedure from the performing physician.  

## 2019-09-12 NOTE — Progress Notes (Signed)
PT taken to PACU. Monitors in place. VSS. Report given to RN. 

## 2019-09-12 NOTE — Patient Instructions (Signed)
Handouts given:  Polyps Resume previous diet Continue current medications Await pathology results X6     YOU HAD AN ENDOSCOPIC PROCEDURE TODAY AT [place]:   Refer to the procedure report that was given to you for any specific questions about what was found during the examination.  If the procedure report does not answer your questions, please call your gastroenterologist to clarify.  If you requested that your care partner not be given the details of your procedure findings, then the procedure report has been included in a sealed envelope for you to review at your convenience later.  YOU SHOULD EXPECT: Some feelings of bloating in the abdomen. Passage of more gas than usual.  Walking can help get rid of the air that was put into your GI tract during the procedure and reduce the bloating. If you had a lower endoscopy (such as a colonoscopy or flexible sigmoidoscopy) you may notice spotting of blood in your stool or on the toilet paper. If you underwent a bowel prep for your procedure, you may not have a normal bowel movement for a few days.  Please Note:  You might notice some irritation and congestion in your nose or some drainage.  This is from the oxygen used during your procedure.  There is no need for concern and it should clear up in a day or so.  SYMPTOMS TO REPORT IMMEDIATELY:   Following lower endoscopy (colonoscopy or flexible sigmoidoscopy):  Excessive amounts of blood in the stool  Significant tenderness or worsening of abdominal pains  Swelling of the abdomen that is new, acute  Fever of 100F or higherFor urgent or emergent issues, a gastroenterologist can be reached at any hour by calling [phone].   DIET:  We do recommend a small meal at first, but then you may proceed to your regular diet.  Drink plenty of fluids but you should avoid alcoholic beverages for 24 hours.  ACTIVITY:  You should plan to take it easy for the rest of today and you should  NOT DRIVE or use heavy machinery until tomorrow (because of the sedation medicines used during the test).    FOLLOW UP: Our staff will call the number listed on your records 48-72 hours following your procedure to check on you and address any questions or concerns that you may have regarding the information given to you following your procedure. If we do not reach you, we will leave a message.  We will attempt to reach you two times.  During this call, we will ask if you have developed any symptoms of COVID 19. If you develop any symptoms (ie: fever, flu-like symptoms, shortness of breath, cough etc.) before then, please call [phone].  If you test positive for Covid 19 in the 2 weeks post procedure, please call and report this information to [place].    If any biopsies were taken you will be contacted by phone or by letter within the next 1-3 weeks.  Please call us at [phone] if you have not heard about the biopsies in 3 weeks.    SIGNATURES/CONFIDENTIALITY: You and/or your care partner have signed paperwork which will be entered into your electronic medical record.  These signatures attest to the fact that that the information above on your After Visit Summary has been reviewed and is understood.  Full responsibility of the confidentiality of this discharge information lies with you and/or your care-partner.

## 2019-09-12 NOTE — Op Note (Signed)
[name] Procedure Date: 09/12/2019 9:44 AM MRN: [id number] Endoscopist: Mallie Mussel L. Loletha Carrow , MD Age: 74 Referring MD:  Date of Birth: 1946/05/19 Gender: Female Account #: [id number] Procedure:                Colonoscopy Indications:              Screening for colorectal malignant neoplasm, This                            is the patient's first colonoscopy Medicines:                Monitored Anesthesia Care Procedure:                Pre-Anesthesia Assessment:                           - Prior to the procedure, a History and Physical                            was performed, and patient medications and                            allergies were reviewed. The patient's tolerance of                            previous anesthesia was also reviewed. The risks                            and benefits of the procedure and the sedation                            options and risks were discussed with the patient.                            All questions were answered, and informed consent                            was obtained. Prior Anticoagulants: The patient has                            taken no previous anticoagulant or antiplatelet                            agents. ASA Grade Assessment: II - A patient with                            mild systemic disease. After reviewing the risks                            and benefits, the patient was deemed in                            satisfactory condition to undergo the procedure.  After obtaining informed consent, the colonoscope                            was passed under direct vision. Throughout the                            procedure, the patient's blood pressure, pulse, and                            oxygen saturations were monitored continuously. The                            Colonoscope was introduced through the anus and                            advanced to the the  cecum, identified by                            appendiceal orifice and ileocecal valve. The                            colonoscopy was performed without difficulty. The                            patient tolerated the procedure well. The quality                            of the bowel preparation was excellent. The                            ileocecal valve, appendiceal orifice, and rectum                            were photographed. The bowel preparation used was                            SUPREP. Scope In: 9:57:08 AM Scope Out: 10:16:24 AM Scope Withdrawal Time: 0 hours 16 minutes 35 seconds  Total Procedure Duration: 0 hours 19 minutes 16 seconds  Findings:                 The digital rectal exam findings include decreased                            sphincter tone.                           Four sessile polyps were found in the ascending                            colon. The polyps were diminutive in size. These                            polyps were removed with a cold snare. Resection  and retrieval were complete.                           Two sessile polyps were found in the proximal                            transverse colon and distal ascending colon. The                            polyps were diminutive in size. These polyps were                            removed with a cold snare. Resection and retrieval                            were complete.                           The exam was otherwise without abnormality on                            direct and retroflexion views. Complications:            No immediate complications. Estimated Blood Loss:     Estimated blood loss was minimal. Impression:               - Decreased sphincter tone found on digital rectal                            exam.                           - Four diminutive polyps in the ascending colon,                            removed with a cold snare. Resected and retrieved.                            - Two diminutive polyps in the proximal transverse                            colon and in the distal ascending colon, removed                            with a cold snare. Resected and retrieved.                           - The examination was otherwise normal on direct                            and retroflexion views. Recommendation:           - Patient has a contact number available for                            emergencies. The signs and  symptoms of potential                            delayed complications were discussed with the                            patient. Return to normal activities tomorrow.                            Written discharge instructions were provided to the                            patient.                           - Resume previous diet.                           - Continue present medications.                           - Await pathology results.                           - Repeat colonoscopy is recommended for                            surveillance. The colonoscopy date will be                            determined after pathology results from today's                            exam become available for review. [name] L. Loletha Carrow, MD 09/12/2019 10:20:06 AM This report has been signed electronically.

## 2019-09-12 NOTE — Progress Notes (Signed)
Temperature taken by L.C., VS taken by C.W. 

## 2019-09-14 ENCOUNTER — Telehealth: Payer: Self-pay

## 2019-09-14 NOTE — Telephone Encounter (Signed)
  Follow up Call-  Call back number 09/12/2019  Post procedure Call Back phone  # [phone]  Permission to leave phone message Yes  Some recent data might be hidden     Patient questions:  Do you have a fever, pain , or abdominal swelling? No. Pain Score  0 *  Have you tolerated food without any problems? Yes.    Have you been able to return to your normal activities? Yes.    Do you have any questions about your discharge instructions: Diet   No. Medications  No. Follow up visit  No.  Do you have questions or concerns about your Care? No.  Actions: * If pain score is 4 or above: No action needed, pain <4.  1. Have you developed a fever since your procedure? no  2.   Have you had an respiratory symptoms (SOB or cough) since your procedure? no  3.   Have you tested positive for COVID 19 since your procedure no  4.   Have you had any family members/close contacts diagnosed with the COVID 19 since your procedure?  no   If yes to any of these questions please route to Joylene John, RN and Alphonsa Gin, Therapist, sports.

## 2019-09-18 ENCOUNTER — Encounter: Payer: Self-pay | Admitting: Gastroenterology

## 2019-09-26 NOTE — Progress Notes (Deleted)
  Subjective:    Patient ID: Julie Orozco, female    DOB: Dec 12, 1945, 74 y.o.   MRN: [id number]  Julie Orozco is a 74 y.o. female who presents for follow-up of Type 2 diabetes mellitus.  Patient {is/are not:32546} checking home blood sugars.   Home blood sugar records: {dm home sugars:14018} How often is blood sugars being checked: *** Current symptoms include: {dm sx:14075}. Patient denies {dm sx:19199}.  Patient {is/are not:32546} checking their feet daily. Any Foot concerns (callous, ulcer, wound, thickened nails, toenail fungus, skin fungus, hammer toe): *** Last dilated eye exam: ***  Current treatments: {dm interventions:14074}. Medication compliance: {good/fair/poor:33178}  Current diet: {diet habits:16563} Current exercise: {exercise types:16438} Known diabetic complications: {diabetes complications:1215}  The following portions of the patient's history were reviewed and updated as appropriate: allergies, current medications, past medical history, past social history and problem list.  ROS as in subjective above.     Objective:    Physical Exam Alert and in no distress otherwise not examined.  There were no vitals taken for this visit.  Lab Review Diabetic Labs Latest Ref Rng & Units 05/30/2019 10/18/2018 06/05/2018 04/17/2018 07/13/2017  HbA1c 4.0 - 5.6 % 7.0(A) 6.1(A) - 6.2(H) -  Chol 100 - 199 mg/dL 185 - 150 181 -  HDL >39 mg/dL 51 - 52 51 -  Calc LDL 0 - 99 mg/dL 112(H) - 76 105(H) -  Triglycerides 0 - 149 mg/dL 125 - 108 125 -  Creatinine 0.57 - 1.00 mg/dL 0.59 0.56(L) - 0.58 0.63   BP/Weight 09/12/2019 08/07/2019 06/27/2019 [id number]  Systolic BP 123XX123 123456 123456 [id number] -  Diastolic BP 54 70 80 60 -  Wt. (Lbs) 170 170.2 172.2 176 175  BMI 33.2 33.24 33.35 34.09 34.18   No flowsheet data found.  Julie Orozco  reports that she has never smoked. She has never used smokeless tobacco. She reports that she does not drink alcohol or use drugs.       Assessment & Plan:    New onset type 2 diabetes mellitus ([place])  1. Rx changes: {none:33079} 2. Education: Reviewed 'ABCs' of diabetes management (respective goals in parentheses):  A1C (<7), blood pressure (<130/80), and cholesterol (LDL <100). 3. Compliance at present is estimated to be {good/fair/poor:33178}. Efforts to improve compliance (if necessary) will be directed at {compliance:16716}. 4. Follow up: {NUMBERS; 0-10:33138} {time:11}

## 2019-09-27 ENCOUNTER — Ambulatory Visit: Payer: Medicare Other | Admitting: Family Medicine

## 2019-10-25 ENCOUNTER — Telehealth: Payer: Self-pay

## 2019-10-25 MED ORDER — ACCU-CHEK GUIDE VI STRP: ORAL_STRIP | 1 refills | Status: DC

## 2019-10-25 NOTE — Telephone Encounter (Signed)
done

## 2019-10-25 NOTE — Telephone Encounter (Signed)
Received fax from [facility] on [address]. For a refill on th epts. accu-chek guide test strips note said need new rx per medicare pt. Last apt was 06/27/19.

## 2019-11-02 LAB — HM DIABETES EYE EXAM

## 2019-12-06 NOTE — Progress Notes (Signed)
Subjective:    Patient ID: Julie Orozco, female    DOB: [birth year], 74 y.o.   MRN: [id number]  Julie Orozco is a 74 y.o. female who presents for follow-up of Type 2 diabetes mellitus. She does not understand English so a medical interpreter is present.   Diagnosed with diabetes in 05/2019 with a Hgb A1c of 7.0%.  Did not return for a 3 month follow up as encouraged.   HTN- taking lisinopril and doing fine.   HL- simvastatin daily and no issues  Patient is not checking home blood sugars.   Home blood sugar records: patient does not check sugars How often is blood sugars being checked: machine stopped working. Brought a new one but doesn't know how to use it and it broke Current symptoms include: none. Patient denies increased appetite, nausea, visual disturbances, vomiting and weight loss.  Patient is  checking their feet daily. Any Foot concerns (callous, ulcer, wound, thickened nails, toenail fungus, skin fungus, hammer toe): none Last dilated eye exam: a month ago. [facility] care on [address]   Current treatments: doing well on Metformin 500 mg daily  Medication compliance: good  Current diet: in general, a "healthy" diet   Current exercise: walking Known diabetic complications: none  The following portions of the patient's history were reviewed and updated as appropriate: allergies, current medications, past medical history, past social history and problem list.  ROS as in subjective above.     Objective:    Physical Exam Alert and in no distress otherwise not examined.  Blood pressure 120/70, pulse 65, temperature 97.8 F (36.6 C), weight 167 lb 12.8 oz (76.1 kg).  Lab Review Diabetic Labs Latest Ref Rng & Units 05/30/2019 10/18/2018 06/05/2018 04/17/2018 07/13/2017  HbA1c 4.0 - 5.6 % 7.0(A) 6.1(A) - 6.2(H) -  Chol 100 - 199 mg/dL 185 - 150 181 -  HDL >39 mg/dL 51 - 52 51 -  Calc LDL 0 - 99 mg/dL 112(H) - 76 105(H) -  Triglycerides 0 - 149 mg/dL 125 - 108  125 -  Creatinine 0.57 - 1.00 mg/dL 0.59 0.56(L) - 0.58 0.63   BP/Weight 12/07/2019 09/12/2019 08/07/2019 06/27/2019 [id number]  Systolic BP 123456 123XX123 123456 123456 [id number]  Diastolic BP 70 54 70 80 60  Wt. (Lbs) 167.8 170 170.2 172.2 176  BMI 32.77 33.2 33.24 33.35 34.09   No flowsheet data found.  Julie Orozco  reports that she has never smoked. She has never used smokeless tobacco. She reports that she does not drink alcohol or use drugs.     Assessment & Plan:    New onset type 2 diabetes mellitus ([place]) - Plan: CBC with Differential/Platelet, Comprehensive metabolic panel, Microalbumin / creatinine urine ratio, Lipid panel, POCT glycosylated hemoglobin (Hb A1C), CANCELED: HgB A1c  Essential hypertension  Elevated LDL cholesterol level - Plan: Lipid panel  Elevated liver enzymes  Vitamin D deficiency - Plan: VITAMIN D 25 Hydroxy (Vit-D Deficiency, Fractures)  1. Rx changes: none Hgb A1c 5.8% and she will continue on once daily Metformin 500 mg  2. Education: Reviewed 'ABCs' of diabetes management (respective goals in parentheses):  A1C (<7), blood pressure (<130/80), and cholesterol (LDL <100). 3. Compliance at present is estimated to be good. Efforts to improve compliance (if necessary) will be directed at increased exercise and regular blood sugar monitoring: 2-3 times weekly. 4. HTN-blood pressure under good control.  Continue on lisinopril. 5. Elevated LDL-doing well on simvastatin.  Follow-up pending lipid panel.  Consider switching to Lipitor  or Crestor if not in goal range. 6. Vitamin D deficiency-check vitamin D level and adjust dose if needed 7. Elevated liver enzymes-she did see GI for this.  Continue to monitor 8. Follow up: 6 months

## 2019-12-07 ENCOUNTER — Ambulatory Visit (INDEPENDENT_AMBULATORY_CARE_PROVIDER_SITE_OTHER): Payer: Medicare Other | Admitting: Family Medicine

## 2019-12-07 ENCOUNTER — Other Ambulatory Visit: Payer: Self-pay

## 2019-12-07 ENCOUNTER — Encounter: Payer: Self-pay | Admitting: Family Medicine

## 2019-12-07 VITALS — BP 120/70 | HR 65 | Temp 97.8°F | Wt 167.8 lb

## 2019-12-07 DIAGNOSIS — E559 Vitamin D deficiency, unspecified: Secondary | ICD-10-CM | POA: Diagnosis not present

## 2019-12-07 DIAGNOSIS — I10 Essential (primary) hypertension: Secondary | ICD-10-CM | POA: Diagnosis not present

## 2019-12-07 DIAGNOSIS — E119 Type 2 diabetes mellitus without complications: Secondary | ICD-10-CM | POA: Diagnosis not present

## 2019-12-07 DIAGNOSIS — R748 Abnormal levels of other serum enzymes: Secondary | ICD-10-CM

## 2019-12-07 DIAGNOSIS — E7800 Pure hypercholesterolemia, unspecified: Secondary | ICD-10-CM | POA: Diagnosis not present

## 2019-12-07 LAB — POCT GLYCOSYLATED HEMOGLOBIN (HGB A1C): Hemoglobin A1C: 5.8 % — AB (ref 4.0–5.6)

## 2019-12-07 NOTE — Patient Instructions (Signed)
Your hemoglobin A1c is 5.8% today and has improved significantly.  Your diabetes is under good control.  Continue on the Metformin once daily.  Your blood pressure is under good control.  Continue taking lisinopril daily  Continue taking simvastatin.  If your cholesterol is not in goal range, I may switch this to a different statin.  I will let you know.  I will see you back in October

## 2019-12-08 LAB — COMPREHENSIVE METABOLIC PANEL
ALT: 21 IU/L (ref 0–32)
AST: 24 IU/L (ref 0–40)
Albumin/Globulin Ratio: 1.5 (ref 1.2–2.2)
Albumin: 4.4 g/dL (ref 3.7–4.7)
Alkaline Phosphatase: 107 IU/L (ref 39–117)
BUN/Creatinine Ratio: 17 (ref 12–28)
BUN: 10 mg/dL (ref 8–27)
Bilirubin Total: 0.3 mg/dL (ref 0.0–1.2)
CO2: 24 mmol/L (ref 20–29)
Calcium: 9.5 mg/dL (ref 8.7–10.3)
Chloride: 103 mmol/L (ref 96–106)
Creatinine, Ser: 0.6 mg/dL (ref 0.57–1.00)
GFR calc Af Amer: 106 mL/min/{1.73_m2} (ref >59)
GFR calc non Af Amer: 92 mL/min/{1.73_m2} (ref >59)
Globulin, Total: 3 g/dL (ref 1.5–4.5)
Glucose: 85 mg/dL (ref 65–99)
Potassium: 4.7 mmol/L (ref 3.5–5.2)
Sodium: 142 mmol/L (ref 134–144)
Total Protein: 7.4 g/dL (ref 6.0–8.5)

## 2019-12-08 LAB — CBC WITH DIFFERENTIAL/PLATELET
Basophils Absolute: 0.1 10*3/uL (ref 0.0–0.2)
Basos: 1 %
EOS (ABSOLUTE): 0.2 10*3/uL (ref 0.0–0.4)
Eos: 2 %
Hematocrit: 39.2 % (ref 34.0–46.6)
Hemoglobin: 13.5 g/dL (ref 11.1–15.9)
Immature Grans (Abs): 0 10*3/uL (ref 0.0–0.1)
Immature Granulocytes: 0 %
Lymphocytes Absolute: 2.3 10*3/uL (ref 0.7–3.1)
Lymphs: 30 %
MCH: 31.9 pg (ref 26.6–33.0)
MCHC: 34.4 g/dL (ref 31.5–35.7)
MCV: 93 fL (ref 79–97)
Monocytes Absolute: 0.7 10*3/uL (ref 0.1–0.9)
Monocytes: 8 %
Neutrophils Absolute: 4.5 10*3/uL (ref 1.4–7.0)
Neutrophils: 59 %
Platelets: 240 10*3/uL (ref 150–450)
RBC: 4.23 x10E6/uL (ref 3.77–5.28)
RDW: 12.1 % (ref 11.7–15.4)
WBC: 7.7 10*3/uL (ref 3.4–10.8)

## 2019-12-08 LAB — LIPID PANEL
Chol/HDL Ratio: 2.7 ratio (ref 0.0–4.4)
Cholesterol, Total: 154 mg/dL (ref 100–199)
HDL: 57 mg/dL (ref >39)
LDL Chol Calc (NIH): 78 mg/dL (ref 0–99)
Triglycerides: 106 mg/dL (ref 0–149)
VLDL Cholesterol Cal: 19 mg/dL (ref 5–40)

## 2019-12-08 LAB — MICROALBUMIN / CREATININE URINE RATIO
Creatinine, Urine: 22.7 mg/dL
Microalb/Creat Ratio: 26 mg/g creat (ref 0–29)
Microalbumin, Urine: 6 ug/mL

## 2019-12-08 LAB — VITAMIN D 25 HYDROXY (VIT D DEFICIENCY, FRACTURES): Vit D, 25-Hydroxy: 41.5 ng/mL (ref 30.0–100.0)

## 2019-12-09 NOTE — Progress Notes (Signed)
Her labs are all good including her liver enzymes, cholesterol and vitamin D level. Please refill her medications for 6 months including her statin and have her continue on current treatments.

## 2019-12-10 ENCOUNTER — Other Ambulatory Visit: Payer: Self-pay | Admitting: Internal Medicine

## 2019-12-10 DIAGNOSIS — E119 Type 2 diabetes mellitus without complications: Secondary | ICD-10-CM

## 2019-12-10 DIAGNOSIS — I10 Essential (primary) hypertension: Secondary | ICD-10-CM

## 2019-12-10 MED ORDER — LISINOPRIL 10 MG PO TABS: 10.0000 mg | ORAL_TABLET | Freq: Every day | ORAL | 1 refills | Status: DC

## 2019-12-10 MED ORDER — METFORMIN HCL 500 MG PO TABS: 500.0000 mg | ORAL_TABLET | Freq: Every day | ORAL | 1 refills | Status: DC

## 2019-12-10 MED ORDER — SIMVASTATIN 10 MG PO TABS: 10.0000 mg | ORAL_TABLET | Freq: Every day | ORAL | 1 refills | Status: DC

## 2019-12-11 ENCOUNTER — Telehealth: Payer: Self-pay | Admitting: Family Medicine

## 2019-12-11 NOTE — Telephone Encounter (Signed)
Requested records received from [facility] °

## 2019-12-18 ENCOUNTER — Encounter: Payer: Self-pay | Admitting: Family Medicine

## 2019-12-18 ENCOUNTER — Encounter: Payer: Self-pay | Admitting: Internal Medicine

## 2020-04-25 ENCOUNTER — Telehealth: Payer: Self-pay

## 2020-04-25 ENCOUNTER — Other Ambulatory Visit: Payer: Self-pay | Admitting: Family Medicine

## 2020-04-25 MED ORDER — ACCU-CHEK FASTCLIX LANCETS MISC: 0 refills | Status: DC

## 2020-04-25 NOTE — Telephone Encounter (Signed)
Pt. Daughter called pt. Needs a refill on her Accu chek fastclix lancets to the [facility] on [address]. Pt. Last apt 12/07/19 and next apt is 06/05/20.

## 2020-04-25 NOTE — Telephone Encounter (Signed)
Resent in with icd code

## 2020-04-25 NOTE — Telephone Encounter (Signed)
[facility] called statin they need a new prescription for the pts. Lancets with an ICD 10 code per the pts. Insurance if you could resend it with an ICD 10 code.

## 2020-04-25 NOTE — Telephone Encounter (Signed)
Sent in

## 2020-04-29 ENCOUNTER — Other Ambulatory Visit: Payer: Self-pay

## 2020-04-29 ENCOUNTER — Encounter: Payer: Self-pay | Admitting: Family Medicine

## 2020-04-29 ENCOUNTER — Ambulatory Visit (INDEPENDENT_AMBULATORY_CARE_PROVIDER_SITE_OTHER): Payer: Medicare Other | Admitting: Family Medicine

## 2020-04-29 VITALS — BP 120/70 | HR 62 | Temp 98.5°F | Wt 169.8 lb

## 2020-04-29 DIAGNOSIS — Z9889 Other specified postprocedural states: Secondary | ICD-10-CM | POA: Diagnosis not present

## 2020-04-29 DIAGNOSIS — N950 Postmenopausal bleeding: Secondary | ICD-10-CM | POA: Diagnosis not present

## 2020-04-29 DIAGNOSIS — E119 Type 2 diabetes mellitus without complications: Secondary | ICD-10-CM | POA: Diagnosis not present

## 2020-04-29 DIAGNOSIS — N898 Other specified noninflammatory disorders of vagina: Secondary | ICD-10-CM

## 2020-04-29 HISTORY — DX: Other specified postprocedural states: Z98.890

## 2020-04-29 NOTE — Progress Notes (Signed)
Subjective:    Patient ID: Julie Orozco, female    DOB: 1945-12-18, 74 y.o.   MRN: [id number]  HPI Chief Complaint  Patient presents with  . spotting    spotting for the last couple- not clots more of a discharge   She is here with her daughter to assist with interpreting.  Complains of a 2 week history of intermittent bright red blood with vaginal discharge. She also has some vaginal itching.  She has been wearing a panty liner due to the amount of blood.   She had a D and C in 2015 for post menopausal bleeding. States she has not had any issues until 2 weeks ago.   Denies fever, chills, dizziness, fatigue, chest pain, palpitations, shortness of breath, N/V/D or constipation, or urinary symptoms.   Denies having recent sexual activity.   Is not on anticoagulant.   States she is taking ibuprofen most nights because it helps her sleep. Denies pain. Needs clarification.   Reviewed allergies, medications, past medical, surgical, family, and social history.   Review of Systems Pertinent positives and negatives in the history of present illness.     Objective:   Physical Exam Exam conducted with a chaperone present.  Constitutional:      Appearance: Normal appearance.  Eyes:     Conjunctiva/sclera: Conjunctivae normal.  Cardiovascular:     Rate and Rhythm: Normal rate and regular rhythm.     Pulses: Normal pulses.     Heart sounds: Normal heart sounds.  Pulmonary:     Effort: Pulmonary effort is normal.     Breath sounds: Normal breath sounds.  Abdominal:     General: Abdomen is flat. Bowel sounds are normal. There is no distension.     Palpations: Abdomen is soft.     Tenderness: There is no abdominal tenderness. There is no right CVA tenderness, left CVA tenderness, guarding or rebound.  Genitourinary:    Labia:        Right: No rash, tenderness or lesion.        Left: No rash, tenderness or lesion.      Vagina: Bleeding present.     Comments: BRB in  vaginal vault  Non obvious abnormality of cervix  Skin:    General: Skin is warm and dry.     Capillary Refill: Capillary refill takes less than 2 seconds.  Neurological:     General: No focal deficit present.     Mental Status: She is alert and oriented to person, place, and time.    BP 120/70   Pulse 62   Temp 98.5 F (36.9 C)   Wt 169 lb 12.8 oz (77 kg)   BMI 33.16 kg/m        Assessment & Plan:  Post-menopausal bleeding - Plan: CBC with Differential/Platelet, Comprehensive metabolic panel, NuSwab Vaginitis Plus (VG+), Ambulatory referral to Gynecology  Vaginal itching - Plan: NuSwab Vaginitis Plus (VG+), Hemoglobin A1c  History of dilatation and curettage - Plan: Ambulatory referral to Gynecology  Controlled type 2 diabetes mellitus without complication, without long-term current use of insulin ([place]) - Plan: Comprehensive metabolic panel, Hemoglobin A1c  She is a pleasant 74 year old female. Here with her daughter who usually comes with her to assist with translating.  Discussed that PMB needs to be evaluated by a specialist. I will refer her to gynecology. Last D & C done in 2015 by Dr. Hulan Fray.  Check for infection with Nuswab.  Is not on anticoagulant.  Recommend cutting back on NSAID due to potential long term complications.  Check CBC, CMP and follow up.  She will return for diabetes and chronic health condition visit as scheduled in October.

## 2020-04-30 LAB — CBC WITH DIFFERENTIAL/PLATELET
Basophils Absolute: 0.1 10*3/uL (ref 0.0–0.2)
Basos: 1 %
EOS (ABSOLUTE): 0.2 10*3/uL (ref 0.0–0.4)
Eos: 3 %
Hematocrit: 37.5 % (ref 34.0–46.6)
Hemoglobin: 13.1 g/dL (ref 11.1–15.9)
Immature Grans (Abs): 0 10*3/uL (ref 0.0–0.1)
Immature Granulocytes: 0 %
Lymphocytes Absolute: 2.6 10*3/uL (ref 0.7–3.1)
Lymphs: 39 %
MCH: 31.6 pg (ref 26.6–33.0)
MCHC: 34.9 g/dL (ref 31.5–35.7)
MCV: 90 fL (ref 79–97)
Monocytes Absolute: 0.5 10*3/uL (ref 0.1–0.9)
Monocytes: 8 %
Neutrophils Absolute: 3.3 10*3/uL (ref 1.4–7.0)
Neutrophils: 49 %
Platelets: 243 10*3/uL (ref 150–450)
RBC: 4.15 x10E6/uL (ref 3.77–5.28)
RDW: 11.8 % (ref 11.7–15.4)
WBC: 6.7 10*3/uL (ref 3.4–10.8)

## 2020-04-30 LAB — COMPREHENSIVE METABOLIC PANEL
ALT: 15 IU/L (ref 0–32)
AST: 16 IU/L (ref 0–40)
Albumin/Globulin Ratio: 1.5 (ref 1.2–2.2)
Albumin: 4.3 g/dL (ref 3.7–4.7)
Alkaline Phosphatase: 94 IU/L (ref 48–121)
BUN/Creatinine Ratio: 21 (ref 12–28)
BUN: 12 mg/dL (ref 8–27)
Bilirubin Total: 0.3 mg/dL (ref 0.0–1.2)
CO2: 25 mmol/L (ref 20–29)
Calcium: 9.7 mg/dL (ref 8.7–10.3)
Chloride: 102 mmol/L (ref 96–106)
Creatinine, Ser: 0.57 mg/dL (ref 0.57–1.00)
GFR calc Af Amer: 107 mL/min/{1.73_m2} (ref >59)
GFR calc non Af Amer: 93 mL/min/{1.73_m2} (ref >59)
Globulin, Total: 2.8 g/dL (ref 1.5–4.5)
Glucose: 98 mg/dL (ref 65–99)
Potassium: 4.5 mmol/L (ref 3.5–5.2)
Sodium: 141 mmol/L (ref 134–144)
Total Protein: 7.1 g/dL (ref 6.0–8.5)

## 2020-04-30 LAB — HEMOGLOBIN A1C
Est. average glucose Bld gHb Est-mCnc: 134 mg/dL
Hgb A1c MFr Bld: 6.3 % — ABNORMAL HIGH (ref 4.8–5.6)

## 2020-05-01 LAB — NUSWAB VAGINITIS PLUS (VG+)
Candida albicans, NAA: NEGATIVE
Candida glabrata, NAA: NEGATIVE
Chlamydia trachomatis, NAA: NEGATIVE
Neisseria gonorrhoeae, NAA: NEGATIVE
Trich vag by NAA: NEGATIVE

## 2020-05-05 NOTE — Progress Notes (Signed)
Her labs were fine and her diabetes is still controlled with a hemoglobin A1c of 6.3%.Marland Kitchen Her vaginal swab was negative. Does she have an appt with a gynecologist?

## 2020-05-12 ENCOUNTER — Encounter: Payer: Self-pay | Admitting: Obstetrics & Gynecology

## 2020-05-12 ENCOUNTER — Ambulatory Visit (INDEPENDENT_AMBULATORY_CARE_PROVIDER_SITE_OTHER): Payer: Medicare Other | Admitting: Obstetrics & Gynecology

## 2020-05-12 ENCOUNTER — Other Ambulatory Visit: Payer: Self-pay

## 2020-05-12 VITALS — BP 132/78 | Ht 59.0 in | Wt 169.0 lb

## 2020-05-12 DIAGNOSIS — R310 Gross hematuria: Secondary | ICD-10-CM | POA: Diagnosis not present

## 2020-05-12 DIAGNOSIS — Z124 Encounter for screening for malignant neoplasm of cervix: Secondary | ICD-10-CM

## 2020-05-12 DIAGNOSIS — N950 Postmenopausal bleeding: Secondary | ICD-10-CM

## 2020-05-12 NOTE — Progress Notes (Signed)
    Julie Orozco [birth year] [id number]        74 y.o.  G8P8L8  Accompanied by her daughter.  RP: PMB x 4 weeks  HPI: Intermittent light PMB x 4 weeks. Postmenopause on no HRT.  No pelvic pain.  Abstinent.  No UTI Sx.  H/O D+C for similar PMB about a year ago.  Patho was benign.  Colonoscopy done at that time and benign Polyps were removed.   OB History  Gravida Para Term Preterm AB Living  8 8       8   SAB TAB Ectopic Multiple Live Births               # Outcome Date GA Lbr Len/2nd Weight Sex Delivery Anes PTL Lv  8 Para           7 Para           6 Para           5 Para           4 Para           3 Para           2 Para           1 Para             Past medical history,surgical history, problem list, medications, allergies, family history and social history were all reviewed and documented in the EPIC chart.   Directed ROS with pertinent positives and negatives documented in the history of present illness/assessment and plan.  Exam:  Vitals:   05/12/20 1141  BP: 132/78  Weight: 169 lb (76.7 kg)  Height: 4\' 11"  (1.499 m)   General appearance:  Normal  Abdomen: Normal  Gynecologic exam: Vulva normal.  Speculum:  Cervix wnl with small BVs.  Pap reflex done.  Silver Nitrate applied after.  Vagina normal.  Bimanual exam:  Uterus AV, normal volume, NT, mobile.  No adnexal mass, NT.  U/A: Yellow clear, protein negative, nitrites negative, red blood cells 10-20, white blood cells negative, bacteria few.  Urine culture pending.   Assessment/Plan:  74 y.o. G8P8   1. PMB (postmenopausal bleeding) Postmenopausal bleeding on no hormone replacement therapy.  Gynecologic exam normal today.  Patient will follow up with a pelvic ultrasound to rule out endometrial pathology.  Pap reflex done today to rule out cervical pathology.  Postmenopausal bleeding could also be of urinary origin. - Pap reflex done - US Transvaginal Non-OB; Future   2. Screening for malignant  neoplasm of cervix Pap reflex done. - Pap IG w/ reflex to HPV when ASC-U  3. Gross hematuria Intermittent postmenopausal bleeding x4 weeks.  10-20 red blood cells present by high-power field on urine analysis today.  Refer to urology. - Urinalysis,Complete w/RFL Culture   [name], 11:54 AM 05/12/2020

## 2020-05-13 LAB — PAP IG W/ RFLX HPV ASCU

## 2020-05-14 ENCOUNTER — Telehealth: Payer: Self-pay | Admitting: *Deleted

## 2020-05-14 LAB — URINALYSIS, COMPLETE W/RFL CULTURE
Bilirubin Urine: NEGATIVE
Glucose, UA: NEGATIVE
Hyaline Cast: NONE SEEN /LPF
Ketones, ur: NEGATIVE
Leukocyte Esterase: NEGATIVE
Nitrites, Initial: NEGATIVE
Protein, ur: NEGATIVE
Specific Gravity, Urine: 1.02 (ref 1.001–1.03)
WBC, UA: NONE SEEN /HPF (ref 0–5)
pH: 7.5 (ref 5.0–8.0)

## 2020-05-14 LAB — URINE CULTURE
MICRO NUMBER:: 10941777
Result:: NO GROWTH
SPECIMEN QUALITY:: ADEQUATE

## 2020-05-14 LAB — CULTURE INDICATED

## 2020-05-14 NOTE — Telephone Encounter (Signed)
-----   Message from Princess Bruins, MD sent at 05/12/2020  7:08 PM EDT ----- Regarding: Refer to Urology PMB x 4 weeks.  U/A showed 10-20 RBC/HPF today.  Please refer to Urology with an appointment after the Pelvic US with me.  Referral to Urology not discussed with patient as the U/A result was not available when the patient left.

## 2020-05-14 NOTE — Telephone Encounter (Signed)
Will route to [place] to relay the below, once patient has been aware  of referral , I will then fax information to [facility] so they can call patient to schedule.

## 2020-05-16 NOTE — Telephone Encounter (Signed)
Patient was informed with the note below. Anderson Malta will sent referral to Urology.

## 2020-05-16 NOTE — Telephone Encounter (Signed)
Referral placed in [place] office notes faxed to [facility]. They will call to schedule.

## 2020-05-20 ENCOUNTER — Encounter: Payer: Self-pay | Admitting: Obstetrics & Gynecology

## 2020-05-20 ENCOUNTER — Ambulatory Visit (INDEPENDENT_AMBULATORY_CARE_PROVIDER_SITE_OTHER): Payer: Medicare Other

## 2020-05-20 ENCOUNTER — Ambulatory Visit (INDEPENDENT_AMBULATORY_CARE_PROVIDER_SITE_OTHER): Payer: Medicare Other | Admitting: Obstetrics & Gynecology

## 2020-05-20 ENCOUNTER — Other Ambulatory Visit: Payer: Self-pay

## 2020-05-20 VITALS — BP 126/80

## 2020-05-20 DIAGNOSIS — N840 Polyp of corpus uteri: Secondary | ICD-10-CM

## 2020-05-20 DIAGNOSIS — N854 Malposition of uterus: Secondary | ICD-10-CM

## 2020-05-20 DIAGNOSIS — N950 Postmenopausal bleeding: Secondary | ICD-10-CM | POA: Diagnosis not present

## 2020-05-20 DIAGNOSIS — R9389 Abnormal findings on diagnostic imaging of other specified body structures: Secondary | ICD-10-CM | POA: Diagnosis not present

## 2020-05-20 DIAGNOSIS — N83202 Unspecified ovarian cyst, left side: Secondary | ICD-10-CM

## 2020-05-20 DIAGNOSIS — N7011 Chronic salpingitis: Secondary | ICD-10-CM | POA: Diagnosis not present

## 2020-05-20 DIAGNOSIS — R3121 Asymptomatic microscopic hematuria: Secondary | ICD-10-CM | POA: Diagnosis not present

## 2020-05-20 NOTE — Progress Notes (Signed)
    Julie Orozco 1945-12-20 [id number]        74 y.o.  G8P8L8  RP: PMB x 4 weeks intermittently  HPI: No change x visit on 05/12/2020 when we noted:  Intermittent light PMB x 4 weeks. Postmenopause on no HRT.  No pelvic pain.  Abstinent.  No UTI Sx.  H/O D+C for similar PMB about a year ago.  Patho was benign.  Colonoscopy done at that time and benign Polyps were removed.   OB History  Gravida Para Term Preterm AB Living  8 8       8   SAB TAB Ectopic Multiple Live Births               # Outcome Date GA Lbr Len/2nd Weight Sex Delivery Anes PTL Lv  8 Para           7 Para           6 Para           5 Para           4 Para           3 Para           2 Para           1 Para             Past medical history,surgical history, problem list, medications, allergies, family history and social history were all reviewed and documented in the EPIC chart.   Directed ROS with pertinent positives and negatives documented in the history of present illness/assessment and plan.  Exam:  Vitals:   05/20/20 0921  BP: 126/80   General appearance:  Normal  Pelvic US today: T/V images.  Anteverted uterus with peripheral uterine calcifications.  The uterus is measured at 6.89 x 4.18 x 4.04 cm.  The endometrial lining is thickened with 3 masses seen measured at 1.4 x 1.1 cm, 1.5 x 1.3 cm and 2.1 x 1.6 cm with positive Doppler flow.  The lesions are compatible with endometrial polyps but other etiology cannot be ruled out.  The endometrial lining is measured at 21.44 mm.  Both ovaries are atrophic.  A left ovarian residual thick-walled follicular type cyst is present measured at 7 mm.  At the left adnexa, a sonolucent and avascular tubular cystic structure is present with thick walls and partial septation compatible with a hydrosalpinx.  No adnexal mass otherwise.  No free fluid in the posterior cul-de-sac.   Assessment/Plan:  74 y.o. G8P8   1. PMB (postmenopausal bleeding) Postmenopausal  bleeding with thickened endometrial lining, probable endometrial polyps per pelvic ultrasound today.  Pelvic ultrasound findings thoroughly reviewed with patient.  We will proceed with hysteroscopy MyoSure excision and D&C.  Surgery briefly described to patient.  Will schedule surgery and patient will follow up for preop visit.  2. Endometrial polyp Schedule HSC Myosure excision/D+C  3. Hydrosalpinx Repeat Pelvic US in 3 months to confirm stability.  4. Asymptomatic microscopic hematuria We will recheck urine analysis for blood at preop visit.  [name], 9:33 AM 05/20/2020

## 2020-06-04 NOTE — Progress Notes (Signed)
Julie Orozco is a 74 y.o. female who presents for annual wellness visit and follow-up on chronic medical conditions.  She has the following concerns:  Language barrier. Her daughter is here with her helping translate. Offered medical interpreter and she declines.   Diabetes- last Hgb A1c 04/29/2020 was 6.3%  Taking metformin 500 mg daily   She is taking simvastatin 10 mg daily.   HTN- lisinopril 10 mg  Does not check BP at home.   Reports she has been having intermittent palpitations in the evenings and night time. States they are at rest and last only 2-3 seconds and resolve. Denies chest pain, DOE, orthopnea, LE edema. No palpitations with activity.    States she has been having vulvar itching. This has been ongoing since her last visit which showed negative yeast, BV or STDs with vaginal swab.   Osteopenia per DEXA in 11/20 Vitamin D def- Taking vitamin D 2,000 IUs daily   She saw Dr. Dellis Filbert and was referred to urology due to hematuria.   Asymptomatic hematuria- persistent. I referred her to urology over the summer but [facility] said she no showed her appt. Patient and daughter state they did not know they had an appointment. They do not miss appts.     Immunization History  Administered Date(s) Administered  . Influenza, High Dose Seasonal PF 06/02/2016, 05/25/2017, 08/08/2018  . PFIZER SARS-COV-2 Vaccination 11/21/2019, 12/12/2019  . Pneumococcal Conjugate-13 06/14/2016  . Pneumococcal Polysaccharide-23 11/02/2017  . Tdap 08/31/2011   Last Pap smear: 3 weeks obgyn  Last mammogram: 11/20 Last colonoscopy: 01/21 and needs recall in 2024 Last DEXA: 11/20 Dentist: no. States she has dentures that no longer fit.  Ophtho: year ago Exercise: walking mpre  Other doctors caring for patient include: Dr. Dellis Filbert- Obgyn   Depression screen:  See questionnaire below.  Depression screen [facility] 2/9 06/05/2020 05/30/2019 04/17/2018 06/14/2016  Decreased Interest 0 0 0 0  Down,  Depressed, Hopeless 0 0 0 0  PHQ - 2 Score 0 0 0 0    Fall Risk Screen: see questionnaire below. Fall Risk  06/05/2020 05/30/2019 04/17/2018 06/14/2016  Falls in the past year? 0 0 No No  Number falls in past yr: 0 0 - -  Injury with Fall? 0 0 - -    ADL screen:  See questionnaire below Functional Status Survey: Is the patient deaf or have difficulty hearing?: No Does the patient have difficulty seeing, even when wearing glasses/contacts?: No Does the patient have difficulty concentrating, remembering, or making decisions?: No Does the patient have difficulty walking or climbing stairs?: No Does the patient have difficulty dressing or bathing?: No Does the patient have difficulty doing errands alone such as visiting a doctor's office or shopping?: Yes   End of Life Discussion:  Patient does not have a living will and medical power of attorney.  HCPOA would be Violet Aida Puffer is her daughter. She has 7 other children.     Review of Systems Constitutional: -fever, -chills, -sweats, -unexpected weight change, -anorexia, -fatigue Allergy: -sneezing, -itching, -congestion Dermatology: denies changing moles, rash, lumps, new worrisome lesions ENT: -runny nose, -ear pain, -sore throat, -hoarseness, -sinus pain, -teeth pain, -tinnitus, -hearing loss, -epistaxis Cardiology:  -chest pain, +palpitations, -edema, -orthopnea, -paroxysmal nocturnal dyspnea Respiratory: -cough, -shortness of breath, -dyspnea on exertion, -wheezing, -hemoptysis Gastroenterology: -abdominal pain, -nausea, -vomiting, -diarrhea, -constipation, -blood in stool, -changes in bowel movement, -dysphagia Hematology: -bleeding or bruising problems Musculoskeletal: -arthralgias, -myalgias, -joint swelling, -back pain, -neck pain, -cramping, -gait changes  Ophthalmology: -vision changes, -eye redness, -itching, -discharge Urology: -dysuria, -difficulty urinating, -hematuria, -urinary frequency, -urgency, -incontinence Neurology:  -headache, -weakness, -tingling, -numbness, -speech abnormality, -memory loss, -falls, -dizziness Psychology:  -depressed mood, -agitation, -sleep problems    PHYSICAL EXAM:  BP 120/70   Pulse 63   Ht 5' 0.5" (1.537 m)   Wt 170 lb (77.1 kg)   BMI 32.65 kg/m   General Appearance: Alert, cooperative, no distress, appears stated age Head: Normocephalic, without obvious abnormality, atraumatic Eyes: PERRL, conjunctiva/corneas clear, EOM's intact Ears: Normal TM's and external ear canals Nose: mask on  Throat: mask on  Neck: Supple, no lymphadenopathy; thyroid: no enlargement/tenderness/nodules  Back: Spine nontender, no curvature, ROM normal, no CVA tenderness Lungs: Clear to auscultation bilaterally without wheezes, rales or ronchi; respirations unlabored Chest Wall: No tenderness or deformity Heart: Regular rate and rhythm, S1 and S2 normal, no murmur, rub or gallop Breast Exam: OB/GYN Abdomen: Soft, non-tender, nondistended, normoactive bowel sounds, no masses, no hepatosplenomegaly Genitalia: OB/GYN Extremities: No clubbing, cyanosis or edema Pulses: 2+ and symmetric all extremities Skin: Skin color, texture, turgor normal, no rashes or lesions Lymph nodes: Cervical, supraclavicular, and axillary nodes normal Neurologic: CNII-XII intact, normal strength, sensation and gait Psych: Normal mood, affect, hygiene and grooming.  ASSESSMENT/PLAN: Medicare annual wellness visit, subsequent - Plan: Lipid panel -denies issues with depression, memory, falls or ADLs.  Medications reviewed.  Preventive health care reviewed and she is UTD.  Advance directives discussed. MOST form filled out   Controlled type 2 diabetes mellitus without complication, without long-term current use of insulin ([place]) - Plan: CBC with Differential/Platelet, Comprehensive metabolic panel, TSH, T4, free, T3, Lipid panel -Hgb A1c 6.3% on 04/29/2020. Continue metformin, healthy diet and try to increase  exercise Continue statin. Normal diabetic foot exam.   Osteopenia, unspecified location -counseling on getting adequate calcium in diet, vitamin D supplement and weight bearing exercise   Obesity (BMI 30-39.9) - Plan: Lipid panel -recommend weight loss by healthy diet and exercise   Needs flu shot - Plan: Flu Vaccine QUAD High Dose(Fluad)  Vulvar itching - Plan: fluconazole (DIFLUCAN) 150 MG tablet -negative vaginal swab at last visit. Follow up if not back to baseline.   Vitamin D deficiency - Plan: VITAMIN D 25 Hydroxy (Vit-D Deficiency, Fractures)  Essential hypertension -controlled. Continue lisinopril.   Asymptomatic microscopic hematuria -she has a referral to urology from Dr. Dellis Filbert. She will call and schedule with them.   Intermittent palpitations - Plan: CBC with Differential/Platelet, Comprehensive metabolic panel, EKG 93-[id number], TSH, T4, free, T3 -EKG shows NSR with rate 63. Unremarkable. Read by Dr. Redmond School and myself.  She will follow up with this is worsening. No worrisome symptoms.      Discussed monthly self breast exams and yearly mammograms; at least 30 minutes of aerobic activity at least 5 days/week and weight-bearing exercise 2x/week; proper sunscreen use reviewed; healthy diet, including goals of calcium and vitamin D intake and alcohol recommendations (less than or equal to 1 drink/day) reviewed; regular seatbelt use; changing batteries in smoke detectors.  Immunization recommendations discussed.  Colonoscopy recommendations reviewed   Medicare Attestation I have personally reviewed: The patient's medical and social history Their use of alcohol, tobacco or illicit drugs Their current medications and supplements The patient's functional ability including ADLs,fall risks, home safety risks, cognitive, and hearing and visual impairment Diet and physical activities Evidence for depression or mood disorders  The patient's weight, height, and BMI have been  recorded in the chart.  I have made  referrals, counseling, and provided education to the patient based on review of the above and I have provided the patient with a written personalized care plan for preventive services.     Harland Dingwall, NP-C   06/05/2020

## 2020-06-05 ENCOUNTER — Encounter: Payer: Self-pay | Admitting: Family Medicine

## 2020-06-05 ENCOUNTER — Other Ambulatory Visit: Payer: Self-pay

## 2020-06-05 ENCOUNTER — Ambulatory Visit (INDEPENDENT_AMBULATORY_CARE_PROVIDER_SITE_OTHER): Payer: Medicare Other | Admitting: Family Medicine

## 2020-06-05 VITALS — BP 120/70 | HR 63 | Ht 60.5 in | Wt 170.0 lb

## 2020-06-05 DIAGNOSIS — M8580 Other specified disorders of bone density and structure, unspecified site: Secondary | ICD-10-CM | POA: Diagnosis not present

## 2020-06-05 DIAGNOSIS — E559 Vitamin D deficiency, unspecified: Secondary | ICD-10-CM

## 2020-06-05 DIAGNOSIS — R3121 Asymptomatic microscopic hematuria: Secondary | ICD-10-CM

## 2020-06-05 DIAGNOSIS — Z0000 Encounter for general adult medical examination without abnormal findings: Secondary | ICD-10-CM | POA: Diagnosis not present

## 2020-06-05 DIAGNOSIS — L292 Pruritus vulvae: Secondary | ICD-10-CM

## 2020-06-05 DIAGNOSIS — E669 Obesity, unspecified: Secondary | ICD-10-CM | POA: Diagnosis not present

## 2020-06-05 DIAGNOSIS — Z23 Encounter for immunization: Secondary | ICD-10-CM | POA: Diagnosis not present

## 2020-06-05 DIAGNOSIS — I10 Essential (primary) hypertension: Secondary | ICD-10-CM | POA: Diagnosis not present

## 2020-06-05 DIAGNOSIS — E119 Type 2 diabetes mellitus without complications: Secondary | ICD-10-CM | POA: Diagnosis not present

## 2020-06-05 DIAGNOSIS — R002 Palpitations: Secondary | ICD-10-CM

## 2020-06-05 HISTORY — DX: Palpitations: R00.2

## 2020-06-05 HISTORY — DX: Asymptomatic microscopic hematuria: R31.21

## 2020-06-05 MED ORDER — FLUCONAZOLE 150 MG PO TABS: 150.0000 mg | ORAL_TABLET | Freq: Once | ORAL | 0 refills | Status: AC

## 2020-06-05 NOTE — Patient Instructions (Addendum)
A-1 dentures   Call [facility] since Dr. Dellis Filbert referred you to them for blood in your urine.  [phone]    [name] , Thank you for taking time to come for your Medicare Wellness Visit. I appreciate your ongoing commitment to your health goals. Please review the following plan we discussed and let me know if I can assist you in the future.   These are the goals we discussed:  Check on scheduling a visit to get your dentures worked on.   Continue getting calcium in your diet and taking a vitamin D supplement.  Try to walk for 20 minutes 4-5 days per week.   Return the advance directives at your convenience.   I will be in touch with your lab results.     This is a list of the screening recommended for you and due dates:  Health Maintenance  Topic Date Due  . Flu Shot  03/30/2020  . Hemoglobin A1C  10/27/2020  . Eye exam for diabetics  11/01/2020  . Complete foot exam   06/05/2021  . Mammogram  07/29/2021  . Tetanus Vaccine  08/30/2021  . Colon Cancer Screening  09/11/2022  . DEXA scan (bone density measurement)  Completed  . COVID-19 Vaccine  Completed  .  Hepatitis C: One time screening is recommended by Center for Disease Control  (CDC) for  adults born from [birth year] through 1963.   Completed  . Pneumonia vaccines  Completed

## 2020-06-06 ENCOUNTER — Other Ambulatory Visit: Payer: Self-pay | Admitting: Internal Medicine

## 2020-06-06 DIAGNOSIS — E119 Type 2 diabetes mellitus without complications: Secondary | ICD-10-CM

## 2020-06-06 LAB — CBC WITH DIFFERENTIAL/PLATELET
Basophils Absolute: 0.1 10*3/uL (ref 0.0–0.2)
Basos: 1 %
EOS (ABSOLUTE): 0.2 10*3/uL (ref 0.0–0.4)
Eos: 2 %
Hematocrit: 39.8 % (ref 34.0–46.6)
Hemoglobin: 13.7 g/dL (ref 11.1–15.9)
Immature Grans (Abs): 0 10*3/uL (ref 0.0–0.1)
Immature Granulocytes: 0 %
Lymphocytes Absolute: 2.5 10*3/uL (ref 0.7–3.1)
Lymphs: 29 %
MCH: 31.2 pg (ref 26.6–33.0)
MCHC: 34.4 g/dL (ref 31.5–35.7)
MCV: 91 fL (ref 79–97)
Monocytes Absolute: 0.8 10*3/uL (ref 0.1–0.9)
Monocytes: 9 %
Neutrophils Absolute: 5.1 10*3/uL (ref 1.4–7.0)
Neutrophils: 59 %
Platelets: 266 10*3/uL (ref 150–450)
RBC: 4.39 x10E6/uL (ref 3.77–5.28)
RDW: 11.8 % (ref 11.7–15.4)
WBC: 8.7 10*3/uL (ref 3.4–10.8)

## 2020-06-06 LAB — COMPREHENSIVE METABOLIC PANEL
ALT: 15 IU/L (ref 0–32)
AST: 20 IU/L (ref 0–40)
Albumin/Globulin Ratio: 1.6 (ref 1.2–2.2)
Albumin: 4.6 g/dL (ref 3.7–4.7)
Alkaline Phosphatase: 104 IU/L (ref 44–121)
BUN/Creatinine Ratio: 16 (ref 12–28)
BUN: 11 mg/dL (ref 8–27)
Bilirubin Total: 0.4 mg/dL (ref 0.0–1.2)
CO2: 27 mmol/L (ref 20–29)
Calcium: 9.9 mg/dL (ref 8.7–10.3)
Chloride: 99 mmol/L (ref 96–106)
Creatinine, Ser: 0.67 mg/dL (ref 0.57–1.00)
GFR calc Af Amer: 102 mL/min/{1.73_m2} (ref >59)
GFR calc non Af Amer: 88 mL/min/{1.73_m2} (ref >59)
Globulin, Total: 2.9 g/dL (ref 1.5–4.5)
Glucose: 109 mg/dL — ABNORMAL HIGH (ref 65–99)
Potassium: 4.5 mmol/L (ref 3.5–5.2)
Sodium: 139 mmol/L (ref 134–144)
Total Protein: 7.5 g/dL (ref 6.0–8.5)

## 2020-06-06 LAB — LIPID PANEL
Chol/HDL Ratio: 3.2 ratio (ref 0.0–4.4)
Cholesterol, Total: 177 mg/dL (ref 100–199)
HDL: 55 mg/dL (ref >39)
LDL Chol Calc (NIH): 100 mg/dL — ABNORMAL HIGH (ref 0–99)
Triglycerides: 123 mg/dL (ref 0–149)
VLDL Cholesterol Cal: 22 mg/dL (ref 5–40)

## 2020-06-06 LAB — VITAMIN D 25 HYDROXY (VIT D DEFICIENCY, FRACTURES): Vit D, 25-Hydroxy: 36.9 ng/mL (ref 30.0–100.0)

## 2020-06-06 LAB — T3: T3, Total: 153 ng/dL (ref 71–180)

## 2020-06-06 LAB — TSH: TSH: 2.17 u[IU]/mL (ref 0.450–4.500)

## 2020-06-06 LAB — T4, FREE: Free T4: 1.19 ng/dL (ref 0.82–1.77)

## 2020-06-06 MED ORDER — SIMVASTATIN 20 MG PO TABS: 20.0000 mg | ORAL_TABLET | Freq: Every day | ORAL | 1 refills | Status: DC

## 2020-06-06 MED ORDER — METFORMIN HCL 500 MG PO TABS: 500.0000 mg | ORAL_TABLET | Freq: Every day | ORAL | 1 refills | Status: DC

## 2020-06-06 NOTE — Progress Notes (Signed)
Her labs are good overall but her LDL or bad cholesterol is still a little too high. We should increase her simvastatin dose if she is ok with this. Please send in 20 mg simvastatin and she can double up on the ones she has.

## 2020-06-10 ENCOUNTER — Encounter: Payer: Self-pay | Admitting: Family Medicine

## 2020-06-18 ENCOUNTER — Telehealth: Payer: Self-pay

## 2020-06-18 NOTE — Telephone Encounter (Signed)
Was to have surgery but have not been contacted I had not received a request to schedule from Dr. Marguerita Merles but I do see her note in chart. I called her back to schedule. Left message to call me.

## 2020-06-23 ENCOUNTER — Telehealth: Payer: Self-pay

## 2020-06-23 NOTE — Telephone Encounter (Signed)
I spoke with patient's daughter and discussed scheduling surgery. I advised her regarding GGA surgery prepymt amount due. Financial letter will be mailed.   I scheduled patient for 07/18/20 at 8:30am at [facility].  I advised her daughter of need for drive thru Covid test on 07/15/20 and appt was scheduled. I will mail map and handout.  She was advised of quarantine protocol following Covid test.  I staff messaged Abigail Butts to call her daughter and schedule pre op visit for Mom and she hung up while holding for appt desk.

## 2020-06-30 NOTE — Telephone Encounter (Signed)
Julie Orozco left a message for Julie Orozco to call the office so I can offer her [facility] urology group in [place]. [facility] is delayed with calling patients and scheduling them.

## 2020-07-14 ENCOUNTER — Other Ambulatory Visit: Payer: Self-pay

## 2020-07-14 ENCOUNTER — Encounter: Payer: Self-pay | Admitting: Obstetrics & Gynecology

## 2020-07-14 ENCOUNTER — Ambulatory Visit (INDEPENDENT_AMBULATORY_CARE_PROVIDER_SITE_OTHER): Payer: Medicare Other | Admitting: Obstetrics & Gynecology

## 2020-07-14 DIAGNOSIS — R9389 Abnormal findings on diagnostic imaging of other specified body structures: Secondary | ICD-10-CM | POA: Diagnosis not present

## 2020-07-14 DIAGNOSIS — N840 Polyp of corpus uteri: Secondary | ICD-10-CM

## 2020-07-14 DIAGNOSIS — N950 Postmenopausal bleeding: Secondary | ICD-10-CM

## 2020-07-14 NOTE — Progress Notes (Signed)
    Julie Orozco [birth year] [id number]        74 y.o.  G8P8L8.  Daughter present at visit.  RP: Preop HSC/Myosure Excision/D+C on 07/18/2020  HPI: PMB with thick Endometrial line on Pelvic US.  Probable Endometrial Polyps x 3.  No pelvic pain.     OB History  Gravida Para Term Preterm AB Living  8 8       8   SAB TAB Ectopic Multiple Live Births               # Outcome Date GA Lbr Len/2nd Weight Sex Delivery Anes PTL Lv  8 Para           7 Para           6 Para           5 Para           4 Para           3 Para           2 Para           1 Para             Past medical history,surgical history, problem list, medications, allergies, family history and social history were all reviewed and documented in the EPIC chart.   Directed ROS with pertinent positives and negatives documented in the history of present illness/assessment and plan.  Exam:  There were no vitals filed for this visit. General appearance:  Normal  Pelvic US 05/20/2020: T/V images. Anteverted uterus with peripheral uterine calcifications. The uterus is measured at 6.89 x 4.18 x 4.04 cm. The endometrial lining is thickened with 41masses seen measured at 1.4 x 1.1 cm,1.5 x 1.3 cm and 2.1 x 1.6 cm with positive Doppler flow. The lesions are compatible with endometrial polyps but other etiology cannot be ruled out. The endometrial lining is measured at 21.44 mm. Both ovaries are atrophic. A left ovarian residual thick-walled follicular type cyst is present measured at 7 mm. At the left adnexa,a sonolucent and avascular tubular cystic structure is present with thick wallsand partial septation compatible with a hydrosalpinx. No adnexal mass otherwise. No free fluid in the posterior cul-de-sac.   Assessment/Plan:  74 y.o. G8P8   1. PMB (postmenopausal bleeding) Postmenopausal bleeding with probable endometrial polyps x3 on pelvic ultrasound September 2021.  We will proceed with a hysteroscopy with  excision of polyps with MyoSure and D&C.  Preop preparation, surgery with risks and benefits and postop expectations and precautions reviewed with patient.  Patient voiced understanding and agreement with plan.  2. Endometrial polyp Endometrial Polyps x 3 per Pelvic US.                        Patient was counseled as to the risk of surgery to include the following:  1. Infection (prohylactic antibiotics will be administered)  2. DVT/Pulmonary Embolism (prophylactic pneumo compression stockings will be used)  3.Trauma to internal organs requiring additional surgical procedure to repair any injury to internal organs requiring perhaps additional hospitalization days.  4.Hemmorhage requiring transfusion and blood products which carry risks such as anaphylactic reaction, hepatitis and AIDS  Patient had received literature information on the procedure scheduled and all her questions were answered and fully accepts all risk.    [name], 1:09 PM 07/14/2020

## 2020-07-15 ENCOUNTER — Other Ambulatory Visit (HOSPITAL_COMMUNITY)
Admission: RE | Admit: 2020-07-15 | Discharge: 2020-07-15 | Disposition: A | Discharge status: Home / Self Care | Payer: Medicare Other | Source: Ambulatory Visit | Attending: Obstetrics & Gynecology | Admitting: Obstetrics & Gynecology

## 2020-07-15 DIAGNOSIS — Z20822 Contact with and (suspected) exposure to covid-19: Secondary | ICD-10-CM | POA: Diagnosis not present

## 2020-07-15 DIAGNOSIS — Z01812 Encounter for preprocedural laboratory examination: Secondary | ICD-10-CM | POA: Insufficient documentation

## 2020-07-15 LAB — SARS CORONAVIRUS 2 (TAT 6-24 HRS): SARS Coronavirus 2: NEGATIVE

## 2020-07-16 ENCOUNTER — Other Ambulatory Visit: Payer: Self-pay

## 2020-07-16 ENCOUNTER — Encounter (HOSPITAL_BASED_OUTPATIENT_CLINIC_OR_DEPARTMENT_OTHER): Payer: Self-pay | Admitting: Obstetrics & Gynecology

## 2020-07-16 NOTE — Progress Notes (Addendum)
Spoke w/ via phone for pre-op interview---pt daughter Julie Orozco cell [phone] Lab needs dos----cbc, bmet               Lab results------ekg 06-05-2020 epic COVID test ------07-15-2020 Arrive at -------630 am 07-18-2020 NPO after MN NO Solid Food.   Water  from MN until---530 am then npo Medications to take morning of surgery ----none Diabetic medication -----none day of surgery Patient Special Instructions -----none Pre-Op special Istructions -----none Patient verbalized understanding of instructions that were given at this phone interview. Patient denies shortness of breath, chest pain, fever, cough at this phone interview.   Pt daughter Julie Orozco to interpret, interpreter release to be signed day of surgery  Pt prefers instructions in written spanish if possible

## 2020-07-17 NOTE — Anesthesia Preprocedure Evaluation (Addendum)
Anesthesia Evaluation  Patient identified by MRN, date of birth, ID band Patient awake    Reviewed: Allergy & Precautions, NPO status , Patient's Chart, lab work & pertinent test results  Airway Mallampati: II  TM Distance: >3 FB Neck ROM: Full    Dental no notable dental hx. (+) Edentulous Upper, Edentulous Lower   Pulmonary neg pulmonary ROS,    Pulmonary exam normal breath sounds clear to auscultation       Cardiovascular hypertension, + DVT  Normal cardiovascular exam Rhythm:Regular Rate:Normal     Neuro/Psych negative neurological ROS  negative psych ROS   GI/Hepatic Neg liver ROS,   Endo/Other  diabetes, Type 2  Renal/GU      Musculoskeletal negative musculoskeletal ROS (+)   Abdominal (+) + obese,   Peds  Hematology Hgb 13.2   Anesthesia Other Findings   Reproductive/Obstetrics                            Anesthesia Physical Anesthesia Plan  ASA: II  Anesthesia Plan: MAC and General   Post-op Pain Management:    Induction: Intravenous  PONV Risk Score and Plan: 4 or greater and Treatment may vary due to age or medical condition, Ondansetron and Dexamethasone  Airway Management Planned: Nasal Cannula and Natural Airway  Additional Equipment: None  Intra-op Plan:   Post-operative Plan:   Informed Consent: I have reviewed the patients History and Physical, chart, labs and discussed the procedure including the risks, benefits and alternatives for the proposed anesthesia with the patient or authorized representative who has indicated his/her understanding and acceptance.     Dental advisory given and Interpreter used for interveiw  Plan Discussed with:   Anesthesia Plan Comments: (LMA GA)       Anesthesia Quick Evaluation

## 2020-07-18 ENCOUNTER — Other Ambulatory Visit: Payer: Self-pay

## 2020-07-18 ENCOUNTER — Ambulatory Visit (HOSPITAL_BASED_OUTPATIENT_CLINIC_OR_DEPARTMENT_OTHER)
Admission: RE | Admit: 2020-07-18 | Discharge: 2020-07-18 | Disposition: A | Discharge status: Home / Self Care | Payer: Medicare Other | Attending: Obstetrics & Gynecology | Admitting: Obstetrics & Gynecology

## 2020-07-18 ENCOUNTER — Ambulatory Visit (HOSPITAL_BASED_OUTPATIENT_CLINIC_OR_DEPARTMENT_OTHER): Payer: Medicare Other | Admitting: Anesthesiology

## 2020-07-18 ENCOUNTER — Encounter (HOSPITAL_BASED_OUTPATIENT_CLINIC_OR_DEPARTMENT_OTHER): Payer: Self-pay | Admitting: Obstetrics & Gynecology

## 2020-07-18 ENCOUNTER — Encounter (HOSPITAL_BASED_OUTPATIENT_CLINIC_OR_DEPARTMENT_OTHER)
Admission: RE | Disposition: A | Discharge status: Home / Self Care | Payer: Self-pay | Source: Home / Self Care | Attending: Obstetrics & Gynecology

## 2020-07-18 DIAGNOSIS — C569 Malignant neoplasm of unspecified ovary: Secondary | ICD-10-CM | POA: Diagnosis not present

## 2020-07-18 DIAGNOSIS — N950 Postmenopausal bleeding: Secondary | ICD-10-CM | POA: Diagnosis not present

## 2020-07-18 DIAGNOSIS — E559 Vitamin D deficiency, unspecified: Secondary | ICD-10-CM | POA: Diagnosis not present

## 2020-07-18 DIAGNOSIS — E119 Type 2 diabetes mellitus without complications: Secondary | ICD-10-CM | POA: Diagnosis not present

## 2020-07-18 DIAGNOSIS — Z780 Asymptomatic menopausal state: Secondary | ICD-10-CM | POA: Insufficient documentation

## 2020-07-18 DIAGNOSIS — N840 Polyp of corpus uteri: Secondary | ICD-10-CM | POA: Insufficient documentation

## 2020-07-18 DIAGNOSIS — I10 Essential (primary) hypertension: Secondary | ICD-10-CM | POA: Diagnosis not present

## 2020-07-18 HISTORY — PX: DILATATION & CURETTAGE/HYSTEROSCOPY WITH MYOSURE: SHX6511

## 2020-07-18 HISTORY — DX: Type 2 diabetes mellitus without complications: E11.9

## 2020-07-18 HISTORY — DX: Postmenopausal bleeding: N95.0

## 2020-07-18 LAB — CBC
HCT: 40 % (ref 36.0–46.0)
Hemoglobin: 13.2 g/dL (ref 12.0–15.0)
MCH: 30.9 pg (ref 26.0–34.0)
MCHC: 33 g/dL (ref 30.0–36.0)
MCV: 93.7 fL (ref 80.0–100.0)
Platelets: 251 10*3/uL (ref 150–400)
RBC: 4.27 MIL/uL (ref 3.87–5.11)
RDW: 12.8 % (ref 11.5–15.5)
WBC: 9.5 10*3/uL (ref 4.0–10.5)
nRBC: 0 % (ref 0.0–0.2)

## 2020-07-18 LAB — BASIC METABOLIC PANEL
Anion gap: 11 (ref 5–15)
BUN: 11 mg/dL (ref 8–23)
CO2: 26 mmol/L (ref 22–32)
Calcium: 9.4 mg/dL (ref 8.9–10.3)
Chloride: 102 mmol/L (ref 98–111)
Creatinine, Ser: 0.59 mg/dL (ref 0.44–1.00)
GFR, Estimated: >60 mL/min (ref >60)
Glucose, Bld: 112 mg/dL — ABNORMAL HIGH (ref 70–99)
Potassium: 3.9 mmol/L (ref 3.5–5.1)
Sodium: 139 mmol/L (ref 135–145)

## 2020-07-18 LAB — URINALYSIS, ROUTINE W REFLEX MICROSCOPIC
Bilirubin Urine: NEGATIVE
Glucose, UA: NEGATIVE mg/dL
Hgb urine dipstick: NEGATIVE
Ketones, ur: NEGATIVE mg/dL
Leukocytes,Ua: NEGATIVE
Nitrite: NEGATIVE
Protein, ur: NEGATIVE mg/dL
Specific Gravity, Urine: 1.013 (ref 1.005–1.030)
pH: 5 (ref 5.0–8.0)

## 2020-07-18 LAB — GLUCOSE, CAPILLARY: Glucose-Capillary: 106 mg/dL — ABNORMAL HIGH (ref 70–99)

## 2020-07-18 SURGERY — DILATATION & CURETTAGE/HYSTEROSCOPY WITH MYOSURE
Anesthesia: General | Site: Vagina

## 2020-07-18 MED ORDER — FENTANYL CITRATE (PF) 100 MCG/2ML IJ SOLN
INTRAMUSCULAR | Status: DC | PRN
  Administered 2020-07-18: 50 ug via INTRAVENOUS

## 2020-07-18 MED ORDER — DEXAMETHASONE SODIUM PHOSPHATE 10 MG/ML IJ SOLN
INTRAMUSCULAR | Status: AC
  Filled 2020-07-18: qty 1

## 2020-07-18 MED ORDER — ARTIFICIAL TEARS OPHTHALMIC OINT
TOPICAL_OINTMENT | OPHTHALMIC | Status: AC
  Filled 2020-07-18: qty 3.5

## 2020-07-18 MED ORDER — LIDOCAINE HCL 1 % IJ SOLN
INTRAMUSCULAR | Status: DC | PRN
  Administered 2020-07-18: 20 mL

## 2020-07-18 MED ORDER — CEFAZOLIN SODIUM-DEXTROSE 2-4 GM/100ML-% IV SOLN
2.0000 g | INTRAVENOUS | Status: AC
  Administered 2020-07-18: 2 g via INTRAVENOUS

## 2020-07-18 MED ORDER — PROPOFOL 10 MG/ML IV BOLUS
INTRAVENOUS | Status: DC | PRN
  Administered 2020-07-18: 120 mg via INTRAVENOUS

## 2020-07-18 MED ORDER — ONDANSETRON HCL 4 MG/2ML IJ SOLN
INTRAMUSCULAR | Status: AC
  Filled 2020-07-18: qty 2

## 2020-07-18 MED ORDER — LIDOCAINE 2% (20 MG/ML) 5 ML SYRINGE
INTRAMUSCULAR | Status: DC | PRN
  Administered 2020-07-18: 60 mg via INTRAVENOUS

## 2020-07-18 MED ORDER — LACTATED RINGERS IV SOLN: INTRAVENOUS | Status: DC

## 2020-07-18 MED ORDER — LIDOCAINE 2% (20 MG/ML) 5 ML SYRINGE
INTRAMUSCULAR | Status: AC
  Filled 2020-07-18: qty 5

## 2020-07-18 MED ORDER — DEXAMETHASONE SODIUM PHOSPHATE 10 MG/ML IJ SOLN
INTRAMUSCULAR | Status: DC | PRN
  Administered 2020-07-18: 5 mg via INTRAVENOUS

## 2020-07-18 MED ORDER — KETOROLAC TROMETHAMINE 30 MG/ML IJ SOLN
INTRAMUSCULAR | Status: DC | PRN
  Administered 2020-07-18: 30 mg via INTRAVENOUS

## 2020-07-18 MED ORDER — SODIUM CHLORIDE 0.9 % IR SOLN
Status: DC | PRN
  Administered 2020-07-18 (×2): 3000 mL

## 2020-07-18 MED ORDER — KETOROLAC TROMETHAMINE 30 MG/ML IJ SOLN
INTRAMUSCULAR | Status: AC
  Filled 2020-07-18: qty 1

## 2020-07-18 MED ORDER — CEFAZOLIN SODIUM-DEXTROSE 2-4 GM/100ML-% IV SOLN
INTRAVENOUS | Status: AC
  Filled 2020-07-18: qty 100

## 2020-07-18 MED ORDER — POVIDONE-IODINE 10 % EX SWAB: 2.0000 "application " | Freq: Once | CUTANEOUS | Status: DC

## 2020-07-18 MED ORDER — FENTANYL CITRATE (PF) 100 MCG/2ML IJ SOLN
INTRAMUSCULAR | Status: AC
  Filled 2020-07-18: qty 2

## 2020-07-18 MED ORDER — PROPOFOL 10 MG/ML IV BOLUS
INTRAVENOUS | Status: AC
  Filled 2020-07-18: qty 40

## 2020-07-18 MED ORDER — ONDANSETRON HCL 4 MG/2ML IJ SOLN
INTRAMUSCULAR | Status: DC | PRN
  Administered 2020-07-18: 4 mg via INTRAVENOUS

## 2020-07-18 SURGICAL SUPPLY — 18 items
CATH ROBINSON RED A/P 16FR (CATHETERS) ×2 IMPLANT
COVER WAND RF STERILE (DRAPES) ×2 IMPLANT
DEVICE MYOSURE REACH (MISCELLANEOUS) ×1 IMPLANT
GAUZE 4X4 16PLY RFD (DISPOSABLE) ×2 IMPLANT
GLOVE BIO SURGEON STRL SZ 6.5 (GLOVE) ×2 IMPLANT
GLOVE BIO SURGEON STRL SZ7 (GLOVE) ×2 IMPLANT
GLOVE BIOGEL PI IND STRL 7.0 (GLOVE) ×2 IMPLANT
GLOVE BIOGEL PI IND STRL 7.5 (GLOVE) IMPLANT
GLOVE BIOGEL PI INDICATOR 7.0 (GLOVE) ×2
GLOVE BIOGEL PI INDICATOR 7.5 (GLOVE) ×2
GLOVE SURG UNDER POLY LF SZ7 (GLOVE) ×1 IMPLANT
GOWN STRL REUS W/TWL LRG LVL3 (GOWN DISPOSABLE) ×4 IMPLANT
IV NS IRRIG 3000ML ARTHROMATIC (IV SOLUTION) ×3 IMPLANT
KIT PROCEDURE FLUENT (KITS) ×2 IMPLANT
PACK VAGINAL MINOR WOMEN LF (CUSTOM PROCEDURE TRAY) ×2 IMPLANT
PAD OB MATERNITY 4.3X12.25 (PERSONAL CARE ITEMS) ×2 IMPLANT
PAD PREP 24X48 CUFFED NSTRL (MISCELLANEOUS) ×2 IMPLANT
SEAL ROD LENS SCOPE MYOSURE (ABLATOR) ×2 IMPLANT

## 2020-07-18 NOTE — Discharge Instructions (Signed)
Histeroscopa, cuidados posteriores Hysteroscopy, Care After Lea esta informacin sobre cmo cuidarse despus del procedimiento. Su mdico tambin podr darle indicaciones ms especficas. Comunquese con su mdico si tiene problemas o preguntas. Qu puedo esperar despus del procedimiento? Despus del procedimiento, es comn [employer] siguientes sntomas:  Munday.  Hemorragia. Puede variar desde un leve goteo hasta un manchado similar al menstrual. Siga estas indicaciones en su casa: Actividad  Haga reposo [employer] primeros 1 a 2 das despus del procedimiento.  No se haga duchas vaginales, no use tampones ni tenga relaciones sexuales por 2semanas despus del procedimiento, o hasta que el mdico lo autorice.  No conduzca durante 24horas despus del procedimiento, o durante el tiempo que le haya indicado el mdico.  No conduzca, no use maquinaria pesada ni tome alcohol mientras toma analgsicos recetados. Medicamentos   [employer] de venta libre y los recetados solamente como se lo haya indicado el mdico.  No tome aspirina durante la recuperacin. Puede aumentar el riesgo de sangrado. Instrucciones generales  No tome baos de inmersin, no nade ni use el jacuzzi hasta que el mdico lo autorice. Tome duchas en lugar de baos durante 2semanas, o durante el tiempo que le haya indicado el mdico.  A fin de prevenir o tratar el estreimiento mientras toma analgsicos recetados, el mdico puede recomendarle lo siguiente: ? Electronics engineer suficiente lquido como para mantener la orina clara o de color amarillo plido. ? Tomar medicamentos recetados o de [employer]. ? Consumir alimentos ricos en fibra, como frutas y verduras frescas, cereales integrales y frijoles. ? Limitar el consumo de alimentos ricos en grasa y azcares procesados, como alimentos fritos o dulces.  Concurra a todas las visitas de seguimiento como se lo haya indicado el mdico. Esto es  importante. Comunquese con un mdico si:  Se siente mareado o sufre un desmayo.  Siente nuseas.  Tiene una secrecin vaginal anormal.  Tiene una erupcin cutnea.  El dolor no mejora con medicamentos.  Tiene escalofros. Solicite ayuda de inmediato si:  Tiene una hemorragia ms abundante que un perodo menstrual normal.  Tiene fiebre.  Siente dolor o clicos que empeoran.  Siente dolor abdominal.  Se desmaya.  Siente dolor en los hombros.  Le falta el aire. Resumen  Despus del procedimiento, es posible que tenga calambres y sangrado vaginal leve.  No se haga duchas vaginales, no use tampones ni tenga relaciones sexuales por [employer] despus del procedimiento, o hasta que el mdico lo autorice.  No tome baos de inmersin, no nade ni use el jacuzzi hasta que el mdico lo autorice. Tome duchas en lugar de baos durante 2semanas, o durante el tiempo que le haya indicado el mdico.  Infrmele al mdico si tiene sntomas fuera de lo comn.  Concurra a todas las visitas de seguimiento como se lo haya indicado el mdico. Esto es importante. Esta informacin no tiene Marine scientist el consejo del mdico. Asegrese de hacerle al mdico cualquier pregunta que tenga. Document Revised: 04/22/2017 Document Reviewed: 04/22/2017 Elsevier Patient Education  [place] Instructions  Activity: Get plenty of rest for the remainder of the day. A responsible adult should stay with you for 24 hours following the procedure.  For the next 24 hours, DO NOT: -Drive a car -Paediatric nurse -Drink alcoholic beverages -Take any medication unless instructed by your physician -Make any legal decisions or sign important papers.  Meals: Start with liquid foods such as gelatin or soup. Progress to regular  foods as tolerated. Avoid greasy, spicy, heavy foods. If nausea and/or vomiting occur, drink only clear liquids until the nausea and/or  vomiting subsides. Call your physician if vomiting continues.  Special Instructions/Symptoms: Your throat may feel dry or sore from the anesthesia or the breathing tube placed in your throat during surgery. If this causes discomfort, gargle with warm salt water. The discomfort should disappear within 24 hours.  If you had a scopolamine patch placed behind your ear for the management of post- operative nausea and/or vomiting:  1. The medication in the patch is effective for 72 hours, after which it should be removed.  Wrap patch in a tissue and discard in the trash. Wash hands thoroughly with soap and water. 2. You may remove the patch earlier than 72 hours if you experience unpleasant side effects which may include dry mouth, dizziness or visual disturbances. 3. Avoid touching the patch. Wash your hands with soap and water after contact with the patch.   DISCHARGE INSTRUCTIONS: HYSTEROSCOPY / ENDOMETRIAL ABLATION The following instructions have been prepared to help you care for yourself upon your return home.  May Remove Scop patch on or before  May take Ibuprofen after 3:30pm today   May take stool softner while taking narcotic pain medication to prevent constipation.  Drink plenty of water.  Personal hygiene: Marland Kitchen Use sanitary pads for vaginal drainage, not tampons. . Shower the day after your procedure. . NO tub baths, pools or Jacuzzis for 2-3 weeks. . Wipe front to back after using the bathroom.  Activity and limitations: . Do NOT drive or operate any equipment for 24 hours. The effects of anesthesia are still present and drowsiness may result. . Do NOT rest in bed all day. . Walking is encouraged. . Walk up and down stairs slowly. . You may resume your normal activity in one to two days or as indicated by your physician. Sexual activity: NO intercourse for at least 2 weeks after the procedure, or as indicated by your Doctor.  Diet: Eat a light meal as desired this evening. You  may resume your usual diet tomorrow.  Return to Work: You may resume your work activities in one to two days or as indicated by Marine scientist.  What to expect after your surgery: Expect to have vaginal bleeding/discharge for 2-3 days and spotting for up to 10 days. It is not unusual to have soreness for up to 1-2 weeks. You may have a slight burning sensation when you urinate for the first day. Mild cramps may continue for a couple of days. You may have a regular period in 2-6 weeks.  Call your doctor for any of the following: . Excessive vaginal bleeding or clotting, saturating and changing one pad every hour. . Inability to urinate 6 hours after discharge from hospital. . Pain not relieved by pain medication. . Fever of 100.4 F or greater. . Unusual vaginal discharge or odor.  Return to office _________________Call for an appointment ___________________ Patient's signature: ______________________ Nurse's signature ________________________  [facility] [phone]

## 2020-07-18 NOTE — Transfer of Care (Signed)
Immediate Anesthesia Transfer of Care Note  Patient: Julie Orozco  Procedure(s) Performed: Procedure(s) (LRB): DILATATION & CURETTAGE/HYSTEROSCOPY WITH MYOSURE (N/A)  Patient Location: PACU  Anesthesia Type: General  Level of Consciousness: awake, alert  and oriented  Airway & Oxygen Therapy: Patient Spontanous Breathing and Patient connected to nasal cannula oxygen  Post-op Assessment: Report given to PACU RN and Post -op Vital signs reviewed and stable  Post vital signs: Reviewed and stable  Complications: No apparent anesthesia complications  Last Vitals:  Vitals Value Taken Time  BP 140/69 07/18/20 1020  Temp 36.7 C 07/18/20 1020  Pulse 59 07/18/20 1020  Resp 16 07/18/20 1020  SpO2 98 % 07/18/20 1020    Last Pain:  Vitals:   07/18/20 1020  TempSrc:   PainSc: 0-No pain         Complications: No complications documented.

## 2020-07-18 NOTE — Discharge Summary (Signed)
Physician Discharge Summary  Patient ID: Julie Orozco MRN: [id number] DOB/AGE: 02/27/1946 74 y.o.  Admit date: 07/18/2020 Discharge date: 07/18/2020  Admission Diagnoses: post menopausal bleeding endometrial polyp   Discharge Diagnoses:  Active Problems:   * No active hospital problems. *   Discharged Condition: good  Consults:None  Significant Diagnostic Studies: None  Treatments:surgery: Hysteroscopy with Myosure excision, D+C  Vitals:   07/18/20 0713 07/18/20 0920  BP: 124/61 (!) 153/73  Pulse: (!) 55 64  Resp: 14 10  Temp: 98.5 F (36.9 C) 97.9 F (36.6 C)  SpO2: 99% 99%     Total I/O In: 500 [I.V.:400; IV Piggyback:100] Out: 50 [Urine:50]   Hospital Course: Good  Discharge Exam: Good  Disposition: Home      Follow-up Information    Princess Bruins, MD Follow up in 3 week(s).   Specialty: Obstetrics and Gynecology Contact information: [address] [place] [address] [phone]                Signed: Princess Bruins 07/18/2020, 9:38 AM

## 2020-07-18 NOTE — Op Note (Addendum)
Operative Note  07/18/2020  9:42 AM  PATIENT:  Julie Orozco  74 y.o. female  PRE-OPERATIVE DIAGNOSIS:  post menopausal bleeding, endometrial polyp  POST-OPERATIVE DIAGNOSIS:  post menopausal bleeding, endometrial polyp  PROCEDURE:  Procedure(s): DILATATION & CURETTAGE/HYSTEROSCOPY WITH MYOSURE  SURGEON:  Surgeon(s): Princess Bruins, MD  ANESTHESIA:   general with Laryngeal Mask  FINDINGS: Large Submucosal Fibroid on posterior wall of intrauterine cavity, 2 endometrial polyps, increased vascularity, possible endometrial cancer.  DESCRIPTION OF OPERATION: Under general anesthesia with laryngeal mask, the patient is in lithotomy position.  She is prepped with Betadine on the suprapubic, vulvar and vaginal areas.  The bladder is catheterized.  She is draped as usual.  Timeout is done.  Ancef 2 g IV is given.  The vaginal exam reveals an anteverted uterus, normal volume, no adnexal mass felt.  The speculum is inserted in the vagina.  The anterior lip of the cervix is grasped with a tenaculum.  The hysterometry is at 7 cm.  Dilation of the cervix with Pratt dilators up to #19.  The hysteroscope is inserted in the intra uterine cavity.  Pictures taken.  A large lesion compatible with a submucosal fibroid, endometrial cancer possible, is present at the posterior wall of the intra uterine cavity at the lower aspect of the uterus.  2 endometrial polyps are present in the intra uterine cavity at the right anterior wall and mid anterior wall.  We used the reach MyoSure to excise all lesions.  Pictures are taken after excisions.  The hysteroscope with MyoSure are then removed.  Hemostasis was adequate.  We proceeded with an endometrial curettage with a small sharp curette on all intrauterine surfaces.  All specimens are sent together to pathology.  All instruments are removed.  The patient is brought to recovery room in good and stable status.  ESTIMATED BLOOD LOSS: 30 cc Fluid Deficit: 540  cc   Intake/Output Summary (Last 24 hours) at 07/18/2020 0942 Last data filed at 07/18/2020 0854 Gross per 24 hour  Intake 500 ml  Output 50 ml  Net 450 ml     BLOOD ADMINISTERED:none   LOCAL MEDICATIONS USED:  LIDOCAINE   SPECIMEN:  Source of Specimen:  Excision material of Polyps, Submucosal Fibroid, possible Endometrial Ca?  DISPOSITION OF SPECIMEN:  PATHOLOGY  COUNTS:  YES  PLAN OF CARE: Transfer to PACU  Marie-Lyne LavoieMD9:42 AM

## 2020-07-18 NOTE — H&P (Signed)
Julie Orozco is an 74 y.o. female. [id number].  Daughter present at visit.  RP: HSC/Myosure Excision/D+C on 07/18/2020  HPI: PMB with thick Endometrial line on Pelvic US.  Probable Endometrial Polyps x 3.  No pelvic pain.    Pertinent Gynecological History: Blood transfusions: none Sexually transmitted diseases: no past history Last mammogram: normal  Last pap: normal    Menstrual History: No LMP recorded. Patient is postmenopausal.    Past Medical History:  Diagnosis Date  . DM type 2 (diabetes mellitus, type 2) ([place])   . Elevated ALT measurement 04/18/2018   resolved  . Hyperlipidemia   . Hypertension   . Obesity   . Osteopenia determined by x-ray   . PMB (postmenopausal bleeding)   . SVD (spontaneous vaginal delivery)    x 8    Past Surgical History:  Procedure Laterality Date  . DILATION AND CURETTAGE OF UTERUS N/A 01/25/2014   Procedure: DILATATION AND CURETTAGE;  Surgeon: Emily Filbert, MD;  Location: [facility];  Service: Gynecology;  Laterality: N/A;  . MULTIPLE TOOTH EXTRACTIONS  yrs ago  . TUBAL LIGATION  yrs ago    Family History  Problem Relation Age of Onset  . Hypertension Mother   . Alcohol abuse Father   . Anemia Brother   . Alcohol abuse Brother   . Colon cancer Neg Hx   . Esophageal cancer Neg Hx   . Rectal cancer Neg Hx   . Stomach cancer Neg Hx     Social History:  reports that she has never smoked. She has never used smokeless tobacco. She reports that she does not drink alcohol and does not use drugs.  Allergies: No Known Allergies  Medications Prior to Admission  Medication Sig Dispense Refill Last Dose  . ibuprofen (ADVIL,MOTRIN) 600 MG tablet Take 1 tablet (600 mg total) by mouth every 6 (six) hours as needed. 30 tablet 1 Past Week at Unknown time  . lisinopril (ZESTRIL) 10 MG tablet Take 1 tablet (10 mg total) by mouth daily. 90 tablet 1 07/17/2020 at Unknown time  . metFORMIN (GLUCOPHAGE) 500 MG tablet Take 1 tablet (500 mg total)  by mouth daily with breakfast. 90 tablet 1 07/17/2020 at Unknown time  . Nutritional Supplements (VITAMIN D BOOSTER PO) Take by mouth.   07/17/2020 at Unknown time  . simvastatin (ZOCOR) 20 MG tablet Take 1 tablet (20 mg total) by mouth at bedtime. 90 tablet 1 Past Week at Unknown time  . acetaminophen (TYLENOL) 500 MG tablet Take 1,000 mg by mouth every 6 (six) hours as needed.   More than a month at Unknown time    REVIEW OF SYSTEMS: A ROS was performed and pertinent positives and negatives are included in the history.  GENERAL: No fevers or chills. HEENT: No change in vision, no earache, sore throat or sinus congestion. NECK: No pain or stiffness. CARDIOVASCULAR: No chest pain or pressure. No palpitations. PULMONARY: No shortness of breath, cough or wheeze. GASTROINTESTINAL: No abdominal pain, nausea, vomiting or diarrhea, melena or bright red blood per rectum. GENITOURINARY: No urinary frequency, urgency, hesitancy or dysuria. MUSCULOSKELETAL: No joint or muscle pain, no back pain, no recent trauma. DERMATOLOGIC: No rash, no itching, no lesions. ENDOCRINE: No polyuria, polydipsia, no heat or cold intolerance. No recent change in weight. HEMATOLOGICAL: No anemia or easy bruising or bleeding. NEUROLOGIC: No headache, seizures, numbness, tingling or weakness. PSYCHIATRIC: No depression, no loss of interest in normal activity or change in sleep pattern.  Blood pressure 124/61, pulse (!) 55, temperature 98.5 F (36.9 C), temperature source Oral, resp. rate 14, height 5' (1.524 m), weight 76.7 kg, SpO2 99 %.  Physical Exam:  See office notes   Results for orders placed or performed during the hospital encounter of 07/18/20 (from the past 24 hour(s))  Basic metabolic panel per protocol     Status: Abnormal   Collection Time: 07/18/20  7:15 AM  Result Value Ref Range   Sodium 139 135 - 145 mmol/L   Potassium 3.9 3.5 - 5.1 mmol/L   Chloride 102 98 - 111 mmol/L   CO2 26 22 - 32 mmol/L    Glucose, Bld 112 (H) 70 - 99 mg/dL   BUN 11 8 - 23 mg/dL   Creatinine, Ser 0.59 0.44 - 1.00 mg/dL   Calcium 9.4 8.9 - 10.3 mg/dL   GFR, Estimated >60 >60 mL/min   Anion gap 11 5 - 15  CBC     Status: None   Collection Time: 07/18/20  7:15 AM  Result Value Ref Range   WBC 9.5 4.0 - 10.5 K/uL   RBC 4.27 3.87 - 5.11 MIL/uL   Hemoglobin 13.2 12.0 - 15.0 g/dL   HCT 40.0 36 - 46 %   MCV 93.7 80.0 - 100.0 fL   MCH 30.9 26.0 - 34.0 pg   MCHC 33.0 30.0 - 36.0 g/dL   RDW 12.8 11.5 - 15.5 %   Platelets 251 150 - 400 K/uL   nRBC 0.0 0.0 - 0.2 %   Covid Neg  Pelvic US 05/20/2020: T/V images. Anteverted uterus with peripheral uterine calcifications. The uterus is measured at 6.89 x 4.18 x 4.04 cm. The endometrial lining is thickened with 36masses seen measured at 1.4 x 1.1 cm,1.5 x 1.3 cm and 2.1 x 1.6 cm with positive Doppler flow. The lesions are compatible with endometrial polyps but other etiology cannot be ruled out. The endometrial lining is measured at 21.44 mm. Both ovaries are atrophic. A left ovarian residual thick-walled follicular type cyst is present measured at 7 mm. At the left adnexa,a sonolucent and avascular tubular cystic structure is present with thick wallsand partial septation compatible with a hydrosalpinx. No adnexal mass otherwise. No free fluid in the posterior cul-de-sac.   Assessment/Plan:  74 y.o. G8P8   1. PMB (postmenopausal bleeding) Postmenopausal bleeding with probable endometrial polyps x3 on pelvic ultrasound September 2021.  We will proceed with a hysteroscopy with excision of polyps with MyoSure and D&C. Preop preparation, surgery with risks and benefits and postop expectations and precautions reviewed with patient.  Patient voiced understanding and agreement with plan.  2. Endometrial polyp Endometrial Polyps x 3 per Pelvic US.                        Patient was counseled as to the risk of surgery to include the following:  1. Infection  (prohylactic antibiotics will be administered)  2. DVT/Pulmonary Embolism (prophylactic pneumo compression stockings will be used)  3.Trauma to internal organs requiring additional surgical procedure to repair any injury to internal organs requiring perhaps additional hospitalization days.  4.Hemmorhage requiring transfusion and blood products which carry risks such as anaphylactic reaction, hepatitis and AIDS  Patient had received literature information on the procedure scheduled and all her questions were answered and fully accepts all risk.   Marie-Lyne Serine Kea 07/18/2020, 8:12 AM

## 2020-07-18 NOTE — Anesthesia Procedure Notes (Signed)
Procedure Name: LMA Insertion Date/Time: 07/18/2020 8:33 AM Performed by: Mechele Claude, CRNA Pre-anesthesia Checklist: Patient identified, Emergency Drugs available, Suction available and Patient being monitored Patient Re-evaluated:Patient Re-evaluated prior to induction Oxygen Delivery Method: Circle system utilized Preoxygenation: Pre-oxygenation with 100% oxygen Induction Type: IV induction Ventilation: Mask ventilation without difficulty LMA: LMA inserted LMA Size: 4.0 Number of attempts: 1 Airway Equipment and Method: Bite block Placement Confirmation: positive ETCO2 Tube secured with: Tape Dental Injury: Teeth and Oropharynx as per pre-operative assessment

## 2020-07-18 NOTE — Anesthesia Postprocedure Evaluation (Signed)
Anesthesia Post Note  Patient: Julie Orozco  Procedure(s) Performed: DILATATION & CURETTAGE/HYSTEROSCOPY WITH MYOSURE (N/A Vagina )     Patient location during evaluation: PACU Anesthesia Type: General Level of consciousness: awake and alert Pain management: pain level controlled Vital Signs Assessment: post-procedure vital signs reviewed and stable Respiratory status: spontaneous breathing, nonlabored ventilation, respiratory function stable and patient connected to nasal cannula oxygen Cardiovascular status: blood pressure returned to baseline and stable Postop Assessment: no apparent nausea or vomiting Anesthetic complications: no   No complications documented.  Last Vitals:  Vitals:   07/18/20 1000 07/18/20 1020  BP: (!) 148/65 140/69  Pulse: (!) 56 (!) 59  Resp: 10 16  Temp:  36.7 C  SpO2: 96% 98%    Last Pain:  Vitals:   07/18/20 1020  TempSrc:   PainSc: 0-No pain                 Barnet Glasgow

## 2020-07-19 LAB — URINE CULTURE: Culture: NO GROWTH

## 2020-07-21 ENCOUNTER — Encounter (HOSPITAL_BASED_OUTPATIENT_CLINIC_OR_DEPARTMENT_OTHER): Payer: Self-pay | Admitting: Obstetrics & Gynecology

## 2020-07-22 ENCOUNTER — Telehealth: Payer: Self-pay | Admitting: *Deleted

## 2020-07-22 LAB — SURGICAL PATHOLOGY

## 2020-07-22 MED ORDER — MEGESTROL ACETATE 40 MG PO TABS: 80.0000 mg | ORAL_TABLET | Freq: Two times a day (BID) | ORAL | 0 refills | Status: DC

## 2020-07-22 NOTE — Telephone Encounter (Signed)
-----   Message from Princess Bruins, MD sent at 07/22/2020  9:48 AM EST ----- Regarding: Refer to Gyn Onco Patho:  Endometrial AdenoCarcinoma FIGO 1.  Refer to Gyn-Onco.  In the meantime, start on Megace 80 mg BID until evaluation with Gyn-Onco.  Schedule visit with me next week.

## 2020-07-22 NOTE — Addendum Note (Signed)
Addended by: Thamas Jaegers on: 07/22/2020 03:48 PM   Modules accepted: Orders

## 2020-07-22 NOTE — Telephone Encounter (Signed)
Patient's daughter Lavone Neri was informed of message below per [facility] access note on file. Megace 80 mg was sent to pharmacy. Appointment for consult to discuss results was made for Nov/30/2021 at 10:20am.

## 2020-07-22 NOTE — Telephone Encounter (Signed)
Julie Orozco will you relay the below to patient once she is informed,and have Claudia schedule her for visit next week. I will route the message to Gyn-oncology to schedule her.

## 2020-07-29 ENCOUNTER — Other Ambulatory Visit: Payer: Self-pay

## 2020-07-29 ENCOUNTER — Ambulatory Visit (INDEPENDENT_AMBULATORY_CARE_PROVIDER_SITE_OTHER): Payer: Medicare Other | Admitting: Obstetrics & Gynecology

## 2020-07-29 ENCOUNTER — Encounter: Payer: Self-pay | Admitting: Obstetrics & Gynecology

## 2020-07-29 VITALS — BP 126/80

## 2020-07-29 DIAGNOSIS — C55 Malignant neoplasm of uterus, part unspecified: Secondary | ICD-10-CM | POA: Diagnosis not present

## 2020-07-29 NOTE — Progress Notes (Signed)
    Julie Orozco 14-Oct-1945 [id number]        74 y.o.  G8P8L8 Accompanied by her daughter.  RP: Postop HSC/Myosure Excision/D+C 07/18/2020 with Dx of Endometrioid AdenoCa  HPI: Healing well postop.  No vaginal bleeding.  No pelvic pain.  No fever.  Started Megace 80 mg BID, well tolerated.   OB History  Gravida Para Term Preterm AB Living  8 8       8   SAB TAB Ectopic Multiple Live Births               # Outcome Date GA Lbr Len/2nd Weight Sex Delivery Anes PTL Lv  8 Para           7 Para           6 Para           5 Para           4 Para           3 Para           2 Para           1 Para             Past medical history,surgical history, problem list, medications, allergies, family history and social history were all reviewed and documented in the EPIC chart.   Directed ROS with pertinent positives and negatives documented in the history of present illness/assessment and plan.  Exam:  Vitals:   07/29/20 1012  BP: 126/80   General appearance:  Normal  Patho 07/18/20:  FINAL MICROSCOPIC DIAGNOSIS:   A. ENDOMETRIAL CURETTINGS, POLYP AND FIBROID, CURETTAGE:  - Endometrioid adenocarcinoma with squamous differentiation, see  comment.  COMMENT:  As sample the tumor appears FIGO grade 1. There are background fragments  of polyp.   Assessment/Plan:  74 y.o. 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  No vaginal bleeding.  Started on Megace 80 mg BID.  Endometrioid AdenoCa of the Endometrium FIGO grade 1.  Pathology reviewed with patient and daughter.  Surgical management discussed.  Referred to Gyn-Oncology.  Patient and daughter voiced understanding and agreement with plan.  [name], 10:21 AM 07/29/2020

## 2020-08-01 ENCOUNTER — Telehealth: Payer: Self-pay | Admitting: *Deleted

## 2020-08-01 NOTE — Telephone Encounter (Signed)
Referral message sent to [facility] to call and schedule patient, patient aware of results.

## 2020-08-01 NOTE — Telephone Encounter (Signed)
Called and left the patient's daughter a message to call the office back on Monday. Patient needs to be scheduled for a new patient appt

## 2020-08-04 NOTE — Telephone Encounter (Signed)
Appt scheduled for 12/10 with Dr Denman George

## 2020-08-07 ENCOUNTER — Encounter: Payer: Self-pay | Admitting: Gynecologic Oncology

## 2020-08-07 NOTE — H&P (View-Only) (Signed)
GYNECOLOGIC ONCOLOGY NEW PATIENT CONSULTATION   Patient Name: Julie Orozco  Patient Age: 74 y.o. Date of Service: 08/08/20 Referring Provider: Dr. Dellis Filbert  Primary Care Provider: Girtha Rm, NP-C Consulting Provider: Jeral Pinch, MD   Assessment/Plan:  Postmenopausal patient with long history of rare postmenopausal bleeding, previously evaluated, now with recurrent bleeding and recent D&C showing grade 1 endometrioid adenocarcinoma.  We reviewed the nature of endometrial cancer and its recommended surgical staging, including total hysterectomy, bilateral salpingo-oophorectomy, and lymph node assessment. The patient is a suitable candidate for staging via a minimally invasive approach to surgery.  We reviewed that robotic assistance would be used to complete the surgery. We discussed that most endometrial cancer is detected early and that decisions regarding adjuvant therapy will be made based on her final pathology.   We reviewed the sentinel lymph node technique. Risks and benefits of sentinel lymph node biopsy was reviewed. We reviewed the technique and ICG dye. The patient DOES NOT have an iodine allergy or known liver dysfunction. We reviewed the false negative rate (0.4%), and that 3% of patients with metastatic disease will not have it detected by SLN biopsy in endometrial cancer. A low risk of allergic reaction to the dye, <0.2% for ICG, has been reported. We also discussed that in the case of failed mapping, which occurs 40% of the time, a bilateral or unilateral lymphadenectomy will be performed at the surgeon's discretion.   Potential benefits of sentinel nodes including a higher detection rate for metastasis due to ultrastaging and potential reduction in operative morbidity. However, there remains uncertainty as to the role for treatment of micrometastatic disease. Further, the benefit of operative morbidity associated with the SLN technique in endometrial cancer is not  yet completely known. In other patient populations (e.g. the cervical cancer population) there has been observed reductions in morbidity with SLN biopsy compared to pelvic lymphadenectomy. Lymphedema, nerve dysfunction and lymphocysts are all potential risks with the SLN technique as with complete lymphadenectomy. Additional risks to the patient include the risk of damage to an internal organ while operating in an altered view (e.g. the black and white image of the robotic fluorescence imaging mode).   We discussed the plan for a robotic assisted hysterectomy, bilateral salpingo-oophorectomy, sentinel lymph node evaluation, possible lymph node dissection, possible laparotomy. The risks of surgery were discussed in detail and she understands these to include infection; wound separation; hernia; vaginal cuff separation, injury to adjacent organs such as bowel, bladder, blood vessels, ureters and nerves; bleeding which may require blood transfusion; anesthesia risk; thromboembolic events; possible death; unforeseen complications; possible need for re-exploration; medical complications such as heart attack, stroke, pleural effusion and pneumonia; and, if full lymphadenectomy is performed the risk of lymphedema and lymphocyst. The patient will receive DVT and antibiotic prophylaxis as indicated. She voiced a clear understanding. She had the opportunity to ask questions. Perioperative instructions were reviewed with her. Prescriptions for post-op medications were sent to her pharmacy of choice.  Patient was offered and given her Covid vaccine booster today.  A copy of this note was sent to the patient's referring provider.   55 minutes of total time was spent for this patient encounter, including preparation, face-to-face counseling with the patient and coordination of care, and documentation of the encounter.   Jeral Pinch, MD  Division of Gynecologic Oncology  Department of Obstetrics and Gynecology   [facility]  ___________________________________________  Chief Complaint: Chief Complaint  Patient presents with  . Endometrial  adenocarcinoma ([place])    History of Present Illness:  Julie Orozco is a 74 y.o. y.o. female who is seen in consultation at the request of Dr. Dellis Filbert for an evaluation of recently diagnosed uterine cancer.  Patient reports going through menopause in her mid 3s.  She would have light bleeding every 1 or 2 years that would last for a couple of days.  In September, she had almost a month of daily light bleeding.  In 2015, after being seen with postmenopausal bleeding, she underwent a D&C which revealed endometrial polyp, no hyperplasia or malignancy.  She notes some cramping before her episodes of bleeding.  She also has had some vaginal discharge.  She denies any bleeding since her recent D&C in mid November.  She reports a good appetite without nausea or emesis.  She reports normal bowel and bladder function.  Her history is notable for diabetes, for which she takes Metformin.  She checks her sugars on average once a week.  The highest that they usually run at home is 140s.  She previously experienced some heart palpitations at night, she never saw a cardiologist for this and they have since resolved.  She denies any shortness of breath and is able to ambulate without any chest pain.  Her last A1c was 6.3% on 04/29/20.  Patient lives in [place] with one of her children.  All 8 of her children live in town.  PAST MEDICAL HISTORY:  Past Medical History:  Diagnosis Date  . DM type 2 (diabetes mellitus, type 2) ([place])   . Elevated ALT measurement 04/18/2018   resolved  . Endometrial cancer ([place])   . Hyperlipidemia   . Hypertension   . Obesity   . Osteopenia determined by x-ray   . PMB (postmenopausal bleeding)   . SVD (spontaneous vaginal delivery)    x 8     PAST SURGICAL HISTORY:  Past Surgical History:  Procedure  Laterality Date  . DILATATION & CURETTAGE/HYSTEROSCOPY WITH MYOSURE N/A 07/18/2020   Procedure: DILATATION & CURETTAGE/HYSTEROSCOPY WITH MYOSURE;  Surgeon: Princess Bruins, MD;  Location: [facility];  Service: Gynecology;  Laterality: N/A;  . DILATION AND CURETTAGE OF UTERUS N/A 01/25/2014   Procedure: DILATATION AND CURETTAGE;  Surgeon: Emily Filbert, MD;  Location: [facility];  Service: Gynecology;  Laterality: N/A;  . MULTIPLE TOOTH EXTRACTIONS  yrs ago  . TUBAL LIGATION  yrs ago    OB/GYN HISTORY:  OB History  Gravida Para Term Preterm AB Living  8 8       8   SAB IAB Ectopic Multiple Live Births               # Outcome Date GA Lbr Len/2nd Weight Sex Delivery Anes PTL Lv  8 Para           7 Para           6 Para           5 Para           4 Para           3 Para           2 Para           1 Para             No LMP recorded. Patient is postmenopausal.  Age at menarche: 48 Age at menopause: 80 Hx of HRT: Denies Hx of STDs: Denies Last pap: 04/2020, negative History of  abnormal pap smears: No  SCREENING STUDIES:  Last mammogram: 07/2019  Last colonoscopy: 08/2019 Last bone mineral density: 07/2019  MEDICATIONS: Outpatient Encounter Medications as of 08/08/2020  Medication Sig  . acetaminophen (TYLENOL) 500 MG tablet Take 1,000 mg by mouth every 6 (six) hours as needed.  . Cholecalciferol (VITAMIN D3) 25 MCG (1000 UT) CAPS Take 1,000 Units by mouth daily.  Marland Kitchen ibuprofen (ADVIL,MOTRIN) 600 MG tablet Take 1 tablet (600 mg total) by mouth every 6 (six) hours as needed.  Marland Kitchen lisinopril (ZESTRIL) 10 MG tablet Take 1 tablet (10 mg total) by mouth daily.  . megestrol (MEGACE) 40 MG tablet Take 2 tablets (80 mg total) by mouth 2 (two) times daily.  . metFORMIN (GLUCOPHAGE) 500 MG tablet Take 1 tablet (500 mg total) by mouth daily with breakfast.  . Multiple Vitamin (MULTI VITAMIN DAILY PO) Take 1 tablet by mouth daily.  . simvastatin (ZOCOR) 20 MG tablet Take 1 tablet  (20 mg total) by mouth at bedtime.  Marland Kitchen ibuprofen (ADVIL) 600 MG tablet Take 1 tablet (600 mg total) by mouth every 6 (six) hours as needed for moderate pain. For AFTER surgery only  . senna-docusate (SENOKOT-S) 8.6-50 MG tablet Take 2 tablets by mouth at bedtime. For AFTER surgery, do not take if having diarrhea  . traMADol (ULTRAM) 50 MG tablet Take 1 tablet (50 mg total) by mouth every 6 (six) hours as needed for moderate pain. For AFTER surgery only, do not take and drive  . [DISCONTINUED] Nutritional Supplements (VITAMIN D BOOSTER PO) Take by mouth.   No facility-administered encounter medications on file as of 08/08/2020.    ALLERGIES:  No Known Allergies   FAMILY HISTORY:  Family History  Problem Relation Age of Onset  . Hypertension Mother   . Alcohol abuse Father   . Anemia Brother   . Alcohol abuse Brother   . Breast cancer Cousin        maternal cousin  . Colon cancer Neg Hx   . Esophageal cancer Neg Hx   . Rectal cancer Neg Hx   . Stomach cancer Neg Hx   . Ovarian cancer Neg Hx      SOCIAL HISTORY:  Social Connections: Not on file    REVIEW OF SYSTEMS:  + Fatigue, itching  denies appetite changes, fevers, chills, unexplained weight changes. Denies hearing loss, neck lumps or masses, mouth sores, ringing in ears or voice changes. Denies cough or wheezing.  Denies shortness of breath. Denies chest pain or palpitations. Denies leg swelling. Denies abdominal distention, pain, blood in stools, constipation, diarrhea, nausea, vomiting, or early satiety. Denies pain with intercourse, dysuria, frequency, hematuria or incontinence. Denies hot flashes, pelvic pain, vaginal bleeding or vaginal discharge.   Denies joint pain, back pain or muscle pain/cramps. Denies rash, or wounds. Denies dizziness, headaches, numbness or seizures. Denies swollen lymph nodes or glands, denies easy bruising or bleeding. Denies anxiety, depression, confusion, or decreased  concentration.  Physical Exam:  Vital Signs for this encounter:  Blood pressure (!) 123/59, pulse 75, temperature (!) 97.5 F (36.4 C), temperature source Tympanic, resp. rate 18, height 5' (1.524 m), weight 168 lb 3.2 oz (76.3 kg), SpO2 99 %. Body mass index is 32.85 kg/m. General: Alert, oriented, no acute distress.  HEENT: Normocephalic, atraumatic. Sclera anicteric.  Chest: Clear to auscultation bilaterally. No wheezes, rhonchi, or rales. Cardiovascular: Regular rate and rhythm, no murmurs, rubs, or gallops.  Abdomen: Obese. Normoactive bowel sounds. Soft, nondistended, nontender to palpation. No masses or  hepatosplenomegaly appreciated. No palpable fluid wave.  Extremities: Grossly normal range of motion. Warm, well perfused. No edema bilaterally.  Skin: No rashes or lesions.  Lymphatics: No cervical, supraclavicular, or inguinal adenopathy.  GU:  Normal external female genitalia. No lesions. No discharge or bleeding.             Bladder/urethra:  No lesions or masses, well supported bladder             Vagina: Mildly atrophic vaginal mucosa, no lesions.             Cervix: Normal appearing, no lesions.             Uterus: Small, mobile, no parametrial involvement or nodularity.             Adnexa: No masses appreciated.  LABORATORY AND RADIOLOGIC DATA:  Outside medical records were reviewed to synthesize the above history, along with the history and physical obtained during the visit.   Lab Results  Component Value Date   WBC 9.5 07/18/2020   HGB 13.2 07/18/2020   HCT 40.0 07/18/2020   PLT 251 07/18/2020   GLUCOSE 112 (H) 07/18/2020   CHOL 177 06/05/2020   TRIG 123 06/05/2020   HDL 55 06/05/2020   LDLCALC 100 (H) 06/05/2020   ALT 15 06/05/2020   AST 20 06/05/2020   NA 139 07/18/2020   K 3.9 07/18/2020   CL 102 07/18/2020   CREATININE 0.59 07/18/2020   BUN 11 07/18/2020   CO2 26 07/18/2020   TSH 2.170 06/05/2020   HGBA1C 6.3 (H) 04/29/2020   D&C from 11/19: A.  ENDOMETRIAL CURETTINGS, POLYP AND FIBROID, CURETTAGE:  - Endometrioid adenocarcinoma with squamous differentiation, see  comment.   COMMENT:  As sample the tumor appears FIGO grade 1. There are background fragments  of polyp. Dr. Jeannie Done has reviewed the case. Dr. Dellis Filbert was notified on  07/22/2020.  Pelvic US 05/20/2020:  Anteverted uterus with peripheral uterine calcifications.  The uterus is measured at 6.89 x 4.18 x 4.04 cm. The endometrial lining is thickened with 74masses seen measured at 1.4 x 1.1 cm,1.5 x 1.3 cm and 2.1 x 1.6 cm with positive Doppler flow. The lesions are compatible with endometrial polyps but other etiology cannot be ruled out. The endometrial lining is measured at 21.44 mm.  Both ovaries are atrophic. A left ovarian residual thick-walled follicular type cyst is present measured at 7 mm. At the left adnexa,a sonolucent and avascular tubular cystic structure is present with thick wallsand partial septation compatible with a hydrosalpinx. No adnexal mass otherwise. No free fluid in the posterior cul-de-sac.

## 2020-08-07 NOTE — Progress Notes (Signed)
GYNECOLOGIC ONCOLOGY NEW PATIENT CONSULTATION   Patient Name: Julie Orozco  Patient Age: 74 y.o. Date of Service: 08/08/20 Referring Provider: Dr. Dellis Filbert  Primary Care Provider: Girtha Rm, NP-C Consulting Provider: Jeral Pinch, MD   Assessment/Plan:  Postmenopausal patient with long history of rare postmenopausal bleeding, previously evaluated, now with recurrent bleeding and recent D&C showing grade 1 endometrioid adenocarcinoma.  We reviewed the nature of endometrial cancer and its recommended surgical staging, including total hysterectomy, bilateral salpingo-oophorectomy, and lymph node assessment. The patient is a suitable candidate for staging via a minimally invasive approach to surgery.  We reviewed that robotic assistance would be used to complete the surgery. We discussed that most endometrial cancer is detected early and that decisions regarding adjuvant therapy will be made based on her final pathology.   We reviewed the sentinel lymph node technique. Risks and benefits of sentinel lymph node biopsy was reviewed. We reviewed the technique and ICG dye. The patient DOES NOT have an iodine allergy or known liver dysfunction. We reviewed the false negative rate (0.4%), and that 3% of patients with metastatic disease will not have it detected by SLN biopsy in endometrial cancer. A low risk of allergic reaction to the dye, <0.2% for ICG, has been reported. We also discussed that in the case of failed mapping, which occurs 40% of the time, a bilateral or unilateral lymphadenectomy will be performed at the surgeon's discretion.   Potential benefits of sentinel nodes including a higher detection rate for metastasis due to ultrastaging and potential reduction in operative morbidity. However, there remains uncertainty as to the role for treatment of micrometastatic disease. Further, the benefit of operative morbidity associated with the SLN technique in endometrial cancer is not  yet completely known. In other patient populations (e.g. the cervical cancer population) there has been observed reductions in morbidity with SLN biopsy compared to pelvic lymphadenectomy. Lymphedema, nerve dysfunction and lymphocysts are all potential risks with the SLN technique as with complete lymphadenectomy. Additional risks to the patient include the risk of damage to an internal organ while operating in an altered view (e.g. the black and white image of the robotic fluorescence imaging mode).   We discussed the plan for a robotic assisted hysterectomy, bilateral salpingo-oophorectomy, sentinel lymph node evaluation, possible lymph node dissection, possible laparotomy. The risks of surgery were discussed in detail and she understands these to include infection; wound separation; hernia; vaginal cuff separation, injury to adjacent organs such as bowel, bladder, blood vessels, ureters and nerves; bleeding which may require blood transfusion; anesthesia risk; thromboembolic events; possible death; unforeseen complications; possible need for re-exploration; medical complications such as heart attack, stroke, pleural effusion and pneumonia; and, if full lymphadenectomy is performed the risk of lymphedema and lymphocyst. The patient will receive DVT and antibiotic prophylaxis as indicated. She voiced a clear understanding. She had the opportunity to ask questions. Perioperative instructions were reviewed with her. Prescriptions for post-op medications were sent to her pharmacy of choice.  Patient was offered and given her Covid vaccine booster today.  A copy of this note was sent to the patient's referring provider.   55 minutes of total time was spent for this patient encounter, including preparation, face-to-face counseling with the patient and coordination of care, and documentation of the encounter.   Jeral Pinch, MD  Division of Gynecologic Oncology  Department of Obstetrics and Gynecology   [facility]  ___________________________________________  Chief Complaint: Chief Complaint  Patient presents with  . Endometrial  adenocarcinoma ([place])    History of Present Illness:  Julie Orozco is a 74 y.o. y.o. female who is seen in consultation at the request of Dr. Dellis Filbert for an evaluation of recently diagnosed uterine cancer.  Patient reports going through menopause in her mid 63s.  She would have light bleeding every 1 or 2 years that would last for a couple of days.  In September, she had almost a month of daily light bleeding.  In 2015, after being seen with postmenopausal bleeding, she underwent a D&C which revealed endometrial polyp, no hyperplasia or malignancy.  She notes some cramping before her episodes of bleeding.  She also has had some vaginal discharge.  She denies any bleeding since her recent D&C in mid November.  She reports a good appetite without nausea or emesis.  She reports normal bowel and bladder function.  Her history is notable for diabetes, for which she takes Metformin.  She checks her sugars on average once a week.  The highest that they usually run at home is 140s.  She previously experienced some heart palpitations at night, she never saw a cardiologist for this and they have since resolved.  She denies any shortness of breath and is able to ambulate without any chest pain.  Her last A1c was 6.3% on 04/29/20.  Patient lives in [place] with one of her children.  All 8 of her children live in town.  PAST MEDICAL HISTORY:  Past Medical History:  Diagnosis Date  . DM type 2 (diabetes mellitus, type 2) ([place])   . Elevated ALT measurement 04/18/2018   resolved  . Endometrial cancer ([place])   . Hyperlipidemia   . Hypertension   . Obesity   . Osteopenia determined by x-ray   . PMB (postmenopausal bleeding)   . SVD (spontaneous vaginal delivery)    x 8     PAST SURGICAL HISTORY:  Past Surgical History:  Procedure  Laterality Date  . DILATATION & CURETTAGE/HYSTEROSCOPY WITH MYOSURE N/A 07/18/2020   Procedure: DILATATION & CURETTAGE/HYSTEROSCOPY WITH MYOSURE;  Surgeon: Princess Bruins, MD;  Location: [facility];  Service: Gynecology;  Laterality: N/A;  . DILATION AND CURETTAGE OF UTERUS N/A 01/25/2014   Procedure: DILATATION AND CURETTAGE;  Surgeon: Emily Filbert, MD;  Location: [facility];  Service: Gynecology;  Laterality: N/A;  . MULTIPLE TOOTH EXTRACTIONS  yrs ago  . TUBAL LIGATION  yrs ago    OB/GYN HISTORY:  OB History  Gravida Para Term Preterm AB Living  8 8       8   SAB IAB Ectopic Multiple Live Births               # Outcome Date GA Lbr Len/2nd Weight Sex Delivery Anes PTL Lv  8 Para           7 Para           6 Para           5 Para           4 Para           3 Para           2 Para           1 Para             No LMP recorded. Patient is postmenopausal.  Age at menarche: 21 Age at menopause: 69 Hx of HRT: Denies Hx of STDs: Denies Last pap: 04/2020, negative History of  abnormal pap smears: No  SCREENING STUDIES:  Last mammogram: 07/2019  Last colonoscopy: 08/2019 Last bone mineral density: 07/2019  MEDICATIONS: Outpatient Encounter Medications as of 08/08/2020  Medication Sig  . acetaminophen (TYLENOL) 500 MG tablet Take 1,000 mg by mouth every 6 (six) hours as needed.  . Cholecalciferol (VITAMIN D3) 25 MCG (1000 UT) CAPS Take 1,000 Units by mouth daily.  Marland Kitchen ibuprofen (ADVIL,MOTRIN) 600 MG tablet Take 1 tablet (600 mg total) by mouth every 6 (six) hours as needed.  Marland Kitchen lisinopril (ZESTRIL) 10 MG tablet Take 1 tablet (10 mg total) by mouth daily.  . megestrol (MEGACE) 40 MG tablet Take 2 tablets (80 mg total) by mouth 2 (two) times daily.  . metFORMIN (GLUCOPHAGE) 500 MG tablet Take 1 tablet (500 mg total) by mouth daily with breakfast.  . Multiple Vitamin (MULTI VITAMIN DAILY PO) Take 1 tablet by mouth daily.  . simvastatin (ZOCOR) 20 MG tablet Take 1 tablet  (20 mg total) by mouth at bedtime.  Marland Kitchen ibuprofen (ADVIL) 600 MG tablet Take 1 tablet (600 mg total) by mouth every 6 (six) hours as needed for moderate pain. For AFTER surgery only  . senna-docusate (SENOKOT-S) 8.6-50 MG tablet Take 2 tablets by mouth at bedtime. For AFTER surgery, do not take if having diarrhea  . traMADol (ULTRAM) 50 MG tablet Take 1 tablet (50 mg total) by mouth every 6 (six) hours as needed for moderate pain. For AFTER surgery only, do not take and drive  . [DISCONTINUED] Nutritional Supplements (VITAMIN D BOOSTER PO) Take by mouth.   No facility-administered encounter medications on file as of 08/08/2020.    ALLERGIES:  No Known Allergies   FAMILY HISTORY:  Family History  Problem Relation Age of Onset  . Hypertension Mother   . Alcohol abuse Father   . Anemia Brother   . Alcohol abuse Brother   . Breast cancer Cousin        maternal cousin  . Colon cancer Neg Hx   . Esophageal cancer Neg Hx   . Rectal cancer Neg Hx   . Stomach cancer Neg Hx   . Ovarian cancer Neg Hx      SOCIAL HISTORY:  Social Connections: Not on file    REVIEW OF SYSTEMS:  + Fatigue, itching  denies appetite changes, fevers, chills, unexplained weight changes. Denies hearing loss, neck lumps or masses, mouth sores, ringing in ears or voice changes. Denies cough or wheezing.  Denies shortness of breath. Denies chest pain or palpitations. Denies leg swelling. Denies abdominal distention, pain, blood in stools, constipation, diarrhea, nausea, vomiting, or early satiety. Denies pain with intercourse, dysuria, frequency, hematuria or incontinence. Denies hot flashes, pelvic pain, vaginal bleeding or vaginal discharge.   Denies joint pain, back pain or muscle pain/cramps. Denies rash, or wounds. Denies dizziness, headaches, numbness or seizures. Denies swollen lymph nodes or glands, denies easy bruising or bleeding. Denies anxiety, depression, confusion, or decreased  concentration.  Physical Exam:  Vital Signs for this encounter:  Blood pressure (!) 123/59, pulse 75, temperature (!) 97.5 F (36.4 C), temperature source Tympanic, resp. rate 18, height 5' (1.524 m), weight 168 lb 3.2 oz (76.3 kg), SpO2 99 %. Body mass index is 32.85 kg/m. General: Alert, oriented, no acute distress.  HEENT: Normocephalic, atraumatic. Sclera anicteric.  Chest: Clear to auscultation bilaterally. No wheezes, rhonchi, or rales. Cardiovascular: Regular rate and rhythm, no murmurs, rubs, or gallops.  Abdomen: Obese. Normoactive bowel sounds. Soft, nondistended, nontender to palpation. No masses or  hepatosplenomegaly appreciated. No palpable fluid wave.  Extremities: Grossly normal range of motion. Warm, well perfused. No edema bilaterally.  Skin: No rashes or lesions.  Lymphatics: No cervical, supraclavicular, or inguinal adenopathy.  GU:  Normal external female genitalia. No lesions. No discharge or bleeding.             Bladder/urethra:  No lesions or masses, well supported bladder             Vagina: Mildly atrophic vaginal mucosa, no lesions.             Cervix: Normal appearing, no lesions.             Uterus: Small, mobile, no parametrial involvement or nodularity.             Adnexa: No masses appreciated.  LABORATORY AND RADIOLOGIC DATA:  Outside medical records were reviewed to synthesize the above history, along with the history and physical obtained during the visit.   Lab Results  Component Value Date   WBC 9.5 07/18/2020   HGB 13.2 07/18/2020   HCT 40.0 07/18/2020   PLT 251 07/18/2020   GLUCOSE 112 (H) 07/18/2020   CHOL 177 06/05/2020   TRIG 123 06/05/2020   HDL 55 06/05/2020   LDLCALC 100 (H) 06/05/2020   ALT 15 06/05/2020   AST 20 06/05/2020   NA 139 07/18/2020   K 3.9 07/18/2020   CL 102 07/18/2020   CREATININE 0.59 07/18/2020   BUN 11 07/18/2020   CO2 26 07/18/2020   TSH 2.170 06/05/2020   HGBA1C 6.3 (H) 04/29/2020   D&C from 11/19: A.  ENDOMETRIAL CURETTINGS, POLYP AND FIBROID, CURETTAGE:  - Endometrioid adenocarcinoma with squamous differentiation, see  comment.   COMMENT:  As sample the tumor appears FIGO grade 1. There are background fragments  of polyp. Dr. Jeannie Done has reviewed the case. Dr. Dellis Filbert was notified on  07/22/2020.  Pelvic US 05/20/2020:  Anteverted uterus with peripheral uterine calcifications.  The uterus is measured at 6.89 x 4.18 x 4.04 cm. The endometrial lining is thickened with 1masses seen measured at 1.4 x 1.1 cm,1.5 x 1.3 cm and 2.1 x 1.6 cm with positive Doppler flow. The lesions are compatible with endometrial polyps but other etiology cannot be ruled out. The endometrial lining is measured at 21.44 mm.  Both ovaries are atrophic. A left ovarian residual thick-walled follicular type cyst is present measured at 7 mm. At the left adnexa,a sonolucent and avascular tubular cystic structure is present with thick wallsand partial septation compatible with a hydrosalpinx. No adnexal mass otherwise. No free fluid in the posterior cul-de-sac.

## 2020-08-08 ENCOUNTER — Inpatient Hospital Stay: Payer: Medicare Other | Attending: Gynecologic Oncology | Admitting: Gynecologic Oncology

## 2020-08-08 ENCOUNTER — Encounter: Payer: Self-pay | Admitting: Gynecologic Oncology

## 2020-08-08 ENCOUNTER — Other Ambulatory Visit: Payer: Self-pay

## 2020-08-08 ENCOUNTER — Inpatient Hospital Stay: Payer: Medicare Other

## 2020-08-08 ENCOUNTER — Other Ambulatory Visit: Payer: Self-pay | Admitting: Gynecologic Oncology

## 2020-08-08 VITALS — BP 123/59 | HR 75 | Temp 97.5°F | Resp 18 | Ht 60.0 in | Wt 168.2 lb

## 2020-08-08 DIAGNOSIS — E119 Type 2 diabetes mellitus without complications: Secondary | ICD-10-CM | POA: Diagnosis not present

## 2020-08-08 DIAGNOSIS — Z79899 Other long term (current) drug therapy: Secondary | ICD-10-CM | POA: Diagnosis not present

## 2020-08-08 DIAGNOSIS — E785 Hyperlipidemia, unspecified: Secondary | ICD-10-CM | POA: Insufficient documentation

## 2020-08-08 DIAGNOSIS — Z7984 Long term (current) use of oral hypoglycemic drugs: Secondary | ICD-10-CM | POA: Insufficient documentation

## 2020-08-08 DIAGNOSIS — I10 Essential (primary) hypertension: Secondary | ICD-10-CM | POA: Insufficient documentation

## 2020-08-08 DIAGNOSIS — Z6832 Body mass index (BMI) 32.0-32.9, adult: Secondary | ICD-10-CM | POA: Insufficient documentation

## 2020-08-08 DIAGNOSIS — M8580 Other specified disorders of bone density and structure, unspecified site: Secondary | ICD-10-CM | POA: Insufficient documentation

## 2020-08-08 DIAGNOSIS — C541 Malignant neoplasm of endometrium: Secondary | ICD-10-CM | POA: Insufficient documentation

## 2020-08-08 DIAGNOSIS — E669 Obesity, unspecified: Secondary | ICD-10-CM | POA: Insufficient documentation

## 2020-08-08 DIAGNOSIS — Z23 Encounter for immunization: Secondary | ICD-10-CM

## 2020-08-08 MED ORDER — IBUPROFEN 600 MG PO TABS: 600.0000 mg | ORAL_TABLET | Freq: Four times a day (QID) | ORAL | 0 refills | Status: DC | PRN

## 2020-08-08 MED ORDER — SENNOSIDES-DOCUSATE SODIUM 8.6-50 MG PO TABS: 2.0000 | ORAL_TABLET | Freq: Every day | ORAL | 0 refills | Status: DC

## 2020-08-08 MED ORDER — TRAMADOL HCL 50 MG PO TABS: 50.0000 mg | ORAL_TABLET | Freq: Four times a day (QID) | ORAL | 0 refills | Status: DC | PRN

## 2020-08-08 NOTE — Patient Instructions (Addendum)
Preparing for your Surgery  Plan for surgery on August 27, 2020 with Dr. Jeral Pinch at [facility]. You will be scheduled for a robotic assisted total laparoscopic hysterectomy (removal of the uterus and cervix), bilateral salpingo-oophorectomy (removal of both ovaries and fallopian tubes), sentinel lymph node biopsy, possible lymph node dissection, possible laparotomy.   Pre-operative Testing -You will receive a phone call from presurgical testing at [facility] to arrange for a pre-operative appointment, lab, and COVID test. The COVID test normally happens 3 days prior to the surgery and they ask that you self quarantine after the test up until surgery to decrease chance of exposure.  -Bring your insurance card, copy of an advanced directive if applicable, medication list  -At that visit, you will be asked to sign a consent for a possible blood transfusion in case a transfusion becomes necessary during surgery.  The need for a blood transfusion is rare but having consent is a necessary part of your care.     -You should not be taking blood thinners or aspirin at least ten days prior to surgery unless instructed by your surgeon.  -Do not take supplements such as fish oil (omega 3), red yeast rice, turmeric before your surgery.   Day Before Surgery at [place] will be asked to take in a light diet the day before surgery. You will be advised you can have clear liquids up until 3 hours before your surgery.    Eat a light diet the day before surgery.  Examples including soups, broths, toast, yogurt, mashed potatoes.  AVOID GAS PRODUCING FOODS. Things to avoid include carbonated beverages (fizzy beverages), raw fruits and raw vegetables, or beans.   If your bowels are filled with gas, your surgeon will have difficulty visualizing your pelvic organs which increases your surgical risks.  Your role in recovery Your role is to become active as soon as  directed by your doctor, while still giving yourself time to heal.  Rest when you feel tired. You will be asked to do the following in order to speed your recovery:  - Cough and breathe deeply. This helps to clear and expand your lungs and can prevent pneumonia after surgery.  - [place]. Do mild physical activity. Walking or moving your legs help your circulation and body functions return to normal. Do not try to get up or walk alone the first time after surgery.   -If you develop swelling on one leg or the other, pain in the back of your leg, redness/warmth in one of your legs, please call the office or go to the Emergency Room to have a doppler to rule out a blood clot. For shortness of breath, chest pain-seek care in the Emergency Room as soon as possible. - Actively manage your pain. Managing your pain lets you move in comfort. We will ask you to rate your pain on a scale of zero to 10. It is your responsibility to tell your doctor or nurse where and how much you hurt so your pain can be treated.  Special Considerations -If you are diabetic, you may be placed on insulin after surgery to have closer control over your blood sugars to promote healing and recovery.  This does not mean that you will be discharged on insulin.  If applicable, your oral antidiabetics will be resumed when you are tolerating a solid diet.  -Your final pathology results from surgery should be available around one  week after surgery and the results will be relayed to you when available.  -FMLA forms can be faxed to [phone] and please allow 5-7 business days for completion.  Pain Management After Surgery -You have been prescribed your pain medication and bowel regimen medications before surgery so that you can have these available when you are discharged from the hospital. The pain medication is for use ONLY AFTER surgery and a new prescription will not be given.   -Make sure that you have Tylenol  and Ibuprofen at home to use on a regular basis after surgery for pain control. We recommend alternating the medications every hour to six hours since they work differently and are processed in the body differently for pain relief.  -Review the attached handout on narcotic use and their risks and side effects.   Bowel Regimen -You have been prescribed Sennakot-S to take nightly to prevent constipation especially if you are taking the narcotic pain medication intermittently.  It is important to prevent constipation and drink adequate amounts of liquids. You can stop taking this medication when you are not taking pain medication and you are back on your normal bowel routine.  Risks of Surgery Risks of surgery are low but include bleeding, infection, damage to surrounding structures, re-operation, blood clots, and very rarely death.   Blood Transfusion Information (For the consent to be signed before surgery)  We will be checking your blood type before surgery so in case of emergencies, we will know what type of blood you would need.                                            WHAT IS A BLOOD TRANSFUSION?  A transfusion is the replacement of blood or some of its parts. Blood is made up of multiple cells which provide different functions.  Red blood cells carry oxygen and are used for blood loss replacement.  White blood cells fight against infection.  Platelets control bleeding.  Plasma helps clot blood.  Other blood products are available for specialized needs, such as hemophilia or other clotting disorders. BEFORE THE TRANSFUSION  Who gives blood for transfusions?   You may be able to donate blood to be used at a later date on yourself (autologous donation).  Relatives can be asked to donate blood. This is generally not any safer than if you have received blood from a stranger. The same precautions are taken to ensure safety when a relative's blood is donated.  Healthy volunteers who  are fully evaluated to make sure their blood is safe. This is blood bank blood. Transfusion therapy is the safest it has ever been in the practice of medicine. Before blood is taken from a donor, a complete history is taken to make sure that person has no history of diseases nor engages in risky social behavior (examples are intravenous drug use or sexual activity with multiple partners). The donor's travel history is screened to minimize risk of transmitting infections, such as malaria. The donated blood is tested for signs of infectious diseases, such as HIV and hepatitis. The blood is then tested to be sure it is compatible with you in order to minimize the chance of a transfusion reaction. If you or a relative donates blood, this is often done in anticipation of surgery and is not appropriate for emergency situations. It takes many days to process the  donated blood. RISKS AND COMPLICATIONS Although transfusion therapy is very safe and saves many lives, the main dangers of transfusion include:   Getting an infectious disease.  Developing a transfusion reaction. This is an allergic reaction to something in the blood you were given. Every precaution is taken to prevent this. The decision to have a blood transfusion has been considered carefully by your caregiver before blood is given. Blood is not given unless the benefits outweigh the risks.  AFTER SURGERY INSTRUCTIONS  Return to work: 4 weeks if applicable  Activity: 1. Be up and out of the bed during the day.  Take a nap if needed.  You may walk up steps but be careful and use the hand rail.  Stair climbing will tire you more than you think, you may need to stop part way and rest.   2. No lifting or straining for 6 weeks over 10 pounds. No pushing, pulling, straining for 6 weeks.  3. No driving for 1 week(s).  Do not drive if you are taking narcotic pain medicine and make sure that your reaction time has returned.   4. You can shower as soon  as the next day after surgery. Shower daily.  Use your regular soap and water (not directly on the incision) and pat your incision(s) dry afterwards; don't rub.  No tub baths or submerging your body in water until cleared by your surgeon. If you have the soap that was given to you by pre-surgical testing that was used before surgery, you do not need to use it afterwards because this can irritate your incisions.   5. No sexual activity and nothing in the vagina for 8 weeks.  6. You may experience a small amount of clear drainage from your incisions, which is normal.  If the drainage persists, increases, or changes color please call the office.  7. Do not use creams, lotions, or ointments such as neosporin on your incisions after surgery until advised by your surgeon because they can cause removal of the dermabond glue on your incisions.    8. You may experience vaginal spotting after surgery or around the 6-8 week mark from surgery when the stitches at the top of the vagina begin to dissolve.  The spotting is normal but if you experience heavy bleeding, call our office.  9. Take Tylenol or ibuprofen first for pain and only use narcotic pain medication for severe pain not relieved by the Tylenol or Ibuprofen.  Monitor your Tylenol intake to a max of 4,000 mg in a 24 hour period. You can alternate these medications after surgery.  Diet: 1. Low sodium Heart Healthy Diet is recommended but you are cleared to resume your normal (before surgery) diet after your procedure.  2. It is safe to use a laxative, such as Miralax or Colace, if you have difficulty moving your bowels. You have been prescribed Sennakot at bedtime every evening to keep bowel movements regular and to prevent constipation.    Wound Care: 1. Keep clean and dry.  Shower daily.  Reasons to call the Doctor:  Fever - Oral temperature greater than 100.4 degrees Fahrenheit  Foul-smelling vaginal discharge  Difficulty  urinating  Nausea and vomiting  Increased pain at the site of the incision that is unrelieved with pain medicine.  Difficulty breathing with or without chest pain  New calf pain especially if only on one side  Sudden, continuing increased vaginal bleeding with or without clots.   Contacts: For questions or concerns  you should contact:  Dr. Jeral Pinch at [phone]  Joylene John, NP at [phone]  After Hours: call [phone] and have the GYN Oncologist paged/contacted (after 5 pm or on the weekends)

## 2020-08-08 NOTE — Patient Instructions (Addendum)
cov

## 2020-08-18 ENCOUNTER — Other Ambulatory Visit: Payer: Self-pay

## 2020-08-18 ENCOUNTER — Encounter (HOSPITAL_COMMUNITY)
Admission: RE | Admit: 2020-08-18 | Discharge: 2020-08-18 | Disposition: A | Discharge status: Home / Self Care | Payer: Medicare Other | Source: Ambulatory Visit | Attending: Gynecologic Oncology | Admitting: Gynecologic Oncology

## 2020-08-18 ENCOUNTER — Encounter (HOSPITAL_COMMUNITY): Payer: Self-pay

## 2020-08-18 DIAGNOSIS — Z01812 Encounter for preprocedural laboratory examination: Secondary | ICD-10-CM | POA: Diagnosis not present

## 2020-08-18 LAB — URINALYSIS, ROUTINE W REFLEX MICROSCOPIC
Bacteria, UA: NONE SEEN
Bilirubin Urine: NEGATIVE
Glucose, UA: NEGATIVE mg/dL
Ketones, ur: NEGATIVE mg/dL
Nitrite: NEGATIVE
Protein, ur: NEGATIVE mg/dL
Specific Gravity, Urine: 1.019 (ref 1.005–1.030)
WBC, UA: >50 WBC/hpf — ABNORMAL HIGH (ref 0–5)
pH: 5 (ref 5.0–8.0)

## 2020-08-18 LAB — CBC
HCT: 41.1 % (ref 36.0–46.0)
Hemoglobin: 13.3 g/dL (ref 12.0–15.0)
MCH: 30.6 pg (ref 26.0–34.0)
MCHC: 32.4 g/dL (ref 30.0–36.0)
MCV: 94.5 fL (ref 80.0–100.0)
Platelets: 290 10*3/uL (ref 150–400)
RBC: 4.35 MIL/uL (ref 3.87–5.11)
RDW: 13.1 % (ref 11.5–15.5)
WBC: 10.9 10*3/uL — ABNORMAL HIGH (ref 4.0–10.5)
nRBC: 0 % (ref 0.0–0.2)

## 2020-08-18 LAB — BASIC METABOLIC PANEL
Anion gap: 10 (ref 5–15)
BUN: 18 mg/dL (ref 8–23)
CO2: 24 mmol/L (ref 22–32)
Calcium: 9.4 mg/dL (ref 8.9–10.3)
Chloride: 105 mmol/L (ref 98–111)
Creatinine, Ser: 0.8 mg/dL (ref 0.44–1.00)
GFR, Estimated: >60 mL/min (ref >60)
Glucose, Bld: 74 mg/dL (ref 70–99)
Potassium: 4.3 mmol/L (ref 3.5–5.1)
Sodium: 139 mmol/L (ref 135–145)

## 2020-08-18 LAB — HEMOGLOBIN A1C
Hgb A1c MFr Bld: 6.3 % — ABNORMAL HIGH (ref 4.8–5.6)
Mean Plasma Glucose: 134.11 mg/dL

## 2020-08-18 NOTE — Patient Instructions (Addendum)
DUE TO COVID-19 ONLY ONE VISITOR IS ALLOWED TO COME WITH YOU AND STAY IN THE WAITING ROOM ONLY DURING PRE OP AND PROCEDURE DAY OF SURGERY. THE 1 VISITOR  MAY VISIT WITH YOU AFTER SURGERY IN YOUR PRIVATE ROOM DURING VISITING HOURS ONLY!  YOU NEED TO HAVE A COVID 19 TEST ON: 08/25/20 @ 2:50 PM, THIS TEST MUST BE DONE BEFORE SURGERY,  COVID TESTING SITE [place] [id number], IT IS ON THE RIGHT GOING OUT [address] APPROXIMATELY  2 MINUTES PAST [employer] ON THE RIGHT. ONCE YOUR COVID TEST IS COMPLETED,  PLEASE BEGIN THE QUARANTINE INSTRUCTIONS AS OUTLINED IN YOUR HANDOUT.                Julie Orozco    Your procedure is scheduled on: 08/27/20   Report to [facility] Main  Entrance   Report to short stay at: 5:30 AM     Call this number if you have problems the morning of surgery [phone]    Remember:   Eat a light diet the day before surgery.  Examples including soups, broths, toast, yogurt, mashed potatoes.  Things to avoid include carbonated beverages (fizzy beverages), raw fruits and raw vegetables, or beans.   If your bowels are filled with gas, your surgeon will have difficulty visualizing your pelvic organs which increases your surgical risks.   BRUSH YOUR TEETH MORNING OF SURGERY AND RINSE YOUR MOUTH OUT, NO CHEWING GUM CANDY OR MINTS.    How to Manage Your Diabetes Before and After Surgery  Why is it important to control my blood sugar before and after surgery?  Improving blood sugar levels before and after surgery helps healing and can limit problems.  A way of improving blood sugar control is eating a healthy diet by: o  Eating less sugar and carbohydrates o  Increasing activity/exercise o  Talking with your doctor about reaching your blood sugar goals  High blood sugars (greater than 180 mg/dL) can raise your risk of infections and slow your recovery, so you will need to focus on controlling your diabetes during  the weeks before surgery.  Make sure that the doctor who takes care of your diabetes knows about your planned surgery including the date and location.  How do I manage my blood sugar before surgery?  Check your blood sugar at least 4 times a day, starting 2 days before surgery, to make sure that the level is not too high or low. o Check your blood sugar the morning of your surgery when you wake up and every 2 hours until you get to the Short Stay unit.  If your blood sugar is less than 70 mg/dL, you will need to treat for low blood sugar: o Do not take insulin. o Treat a low blood sugar (less than 70 mg/dL) with  cup of clear juice (cranberry or apple), 4 glucose tablets, OR glucose gel. o Recheck blood sugar in 15 minutes after treatment (to make sure it is greater than 70 mg/dL). If your blood sugar is not greater than 70 mg/dL on recheck, call [phone] for further instructions.  Report your blood sugar to the short stay nurse when you get to Short Stay.   If you are admitted to the hospital after surgery: o Your blood sugar will be checked by the staff and you will probably be given insulin after surgery (instead of oral diabetes medicines) to make sure you have good blood sugar levels. o  The goal for blood sugar control after surgery is 80-180 mg/dL.   WHAT DO I DO ABOUT MY DIABETES MEDICATION?   Do not take oral diabetes medicines (pills) the morning of surgery.   THE DAY BEFORE SURGERY, take Metformin as usual.    THE MORNING OF SURGERY,DO NOT TAKE ANY DIABETIC MEDICATIONS DAY OF YOUR SURGERY                               You may not have any metal on your body including hair pins and              piercings  Do not wear jewelry, make-up, lotions, powders or perfumes, deodorant             Do not wear nail polish on your fingernails.  Do not shave  48 hours prior to surgery.               Do not bring valuables to the hospital. [place].  Contacts, dentures or bridgework may not be worn into surgery.  Leave suitcase in the car. After surgery it may be brought to your room.     Patients discharged the day of surgery will not be allowed to drive home. IF YOU ARE HAVING SURGERY AND GOING HOME THE SAME DAY, YOU MUST HAVE AN ADULT TO DRIVE YOU HOME AND BE WITH YOU FOR 24 HOURS. YOU MAY GO HOME BY TAXI OR UBER OR ORTHERWISE, BUT AN ADULT MUST ACCOMPANY YOU HOME AND STAY WITH YOU FOR 24 HOURS.  Name and phone number of your driver:  Special Instructions: N/A              Please read over the following fact sheets you were given: _____________________________________________________________________        [facility] - Preparing for Surgery Before surgery, you can play an important role.  Because skin is not sterile, your skin needs to be as free of germs as possible.  You can reduce the number of germs on your skin by washing with CHG (chlorahexidine gluconate) soap before surgery.  CHG is an antiseptic cleaner which kills germs and bonds with the skin to continue killing germs even after washing. Please DO NOT use if you have an allergy to CHG or antibacterial soaps.  If your skin becomes reddened/irritated stop using the CHG and inform your nurse when you arrive at Short Stay. Do not shave (including legs and underarms) for at least 48 hours prior to the first CHG shower.  You may shave your face/neck. Please follow these instructions carefully:  1.  Shower with CHG Soap the night before surgery and the  morning of Surgery.  2.  If you choose to wash your hair, wash your hair first as usual with your  normal  shampoo.  3.  After you shampoo, rinse your hair and body thoroughly to remove the  shampoo.                           4.  Use CHG as you would any other liquid soap.  You can apply chg directly  to the skin and wash  Gently with a scrungie or clean washcloth.  5.  Apply the CHG Soap to your  body ONLY FROM THE NECK DOWN.   Do not use on face/ open                           Wound or open sores. Avoid contact with eyes, ears mouth and genitals (private parts).                       Wash face,  Genitals (private parts) with your normal soap.             6.  Wash thoroughly, paying special attention to the area where your surgery  will be performed.  7.  Thoroughly rinse your body with warm water from the neck down.  8.  DO NOT shower/wash with your normal soap after using and rinsing off  the CHG Soap.                9.  Pat yourself dry with a clean towel.            10.  Wear clean pajamas.            11.  Place clean sheets on your bed the night of your first shower and do not  sleep with pets. Day of Surgery : Do not apply any lotions/deodorants the morning of surgery.  Please wear clean clothes to the hospital/surgery center.  FAILURE TO FOLLOW THESE INSTRUCTIONS MAY RESULT IN THE CANCELLATION OF YOUR SURGERY PATIENT SIGNATURE_________________________________  NURSE SIGNATURE__________________________________  ________________________________________________________________________

## 2020-08-18 NOTE — Progress Notes (Signed)
UA: Leukocytes: Large.

## 2020-08-18 NOTE — Progress Notes (Addendum)
COVID Vaccine Completed: Yes Date COVID Vaccine completed: 08/08/20. [place] COVID vaccine manufacturer: [place]      PCP -  Harland Dingwall: NP Cardiologist - NO  Chest x-ray -  EKG - 06/06/20 Stress Test -  ECHO -  Cardiac Cath -  Pacemaker/ICD device last checked:  Sleep Study -  CPAP -   Fasting Blood Sugar - 100-140's Checks Blood Sugar __2___ times a week.  Blood Thinner Instructions: Aspirin Instructions: Last Dose:  Anesthesia review: Hx: HTN,DIA  Patient denies shortness of breath, fever, cough and chest pain at PAT appointment   Patient verbalized understanding of instructions that were given to them at the PAT appointment. Patient was also instructed that they will need to review over the PAT instructions again at home before surgery.

## 2020-08-19 LAB — URINE CULTURE: Culture: <10000 — AB

## 2020-08-25 ENCOUNTER — Other Ambulatory Visit (HOSPITAL_COMMUNITY)
Admission: RE | Admit: 2020-08-25 | Discharge: 2020-08-25 | Disposition: A | Discharge status: Home / Self Care | Payer: Medicare Other | Source: Ambulatory Visit | Attending: Gynecologic Oncology | Admitting: Gynecologic Oncology

## 2020-08-25 DIAGNOSIS — Z01812 Encounter for preprocedural laboratory examination: Secondary | ICD-10-CM | POA: Insufficient documentation

## 2020-08-25 DIAGNOSIS — Z20822 Contact with and (suspected) exposure to covid-19: Secondary | ICD-10-CM | POA: Diagnosis not present

## 2020-08-25 LAB — SARS CORONAVIRUS 2 (TAT 6-24 HRS): SARS Coronavirus 2: NEGATIVE

## 2020-08-26 ENCOUNTER — Telehealth: Payer: Self-pay

## 2020-08-26 ENCOUNTER — Other Ambulatory Visit: Payer: Self-pay | Admitting: Family Medicine

## 2020-08-26 DIAGNOSIS — I10 Essential (primary) hypertension: Secondary | ICD-10-CM

## 2020-08-26 NOTE — Anesthesia Preprocedure Evaluation (Addendum)
Anesthesia Evaluation  Patient identified by MRN, date of birth, ID band Patient awake    Reviewed: Allergy & Precautions, H&P , NPO status , Patient's Chart, lab work & pertinent test results  Airway Mallampati: I  TM Distance: >3 FB Neck ROM: Full    Dental no notable dental hx. (+) Edentulous Upper, Edentulous Lower, Dental Advisory Given   Pulmonary neg pulmonary ROS,    Pulmonary exam normal breath sounds clear to auscultation       Cardiovascular Exercise Tolerance: Good hypertension, Pt. on medications  Rhythm:Regular Rate:Normal     Neuro/Psych negative neurological ROS  negative psych ROS   GI/Hepatic negative GI ROS, Neg liver ROS,   Endo/Other  diabetes, Type 2, Oral Hypoglycemic Agents  Renal/GU negative Renal ROS  negative genitourinary   Musculoskeletal   Abdominal   Peds  Hematology negative hematology ROS (+)   Anesthesia Other Findings   Reproductive/Obstetrics negative OB ROS                            Anesthesia Physical Anesthesia Plan  ASA: II  Anesthesia Plan: General   Post-op Pain Management:    Induction: Intravenous  PONV Risk Score and Plan: 4 or greater and Ondansetron, Midazolam, Diphenhydramine and Dexamethasone  Airway Management Planned: Oral ETT  Additional Equipment:   Intra-op Plan:   Post-operative Plan: Extubation in OR  Informed Consent: I have reviewed the patients History and Physical, chart, labs and discussed the procedure including the risks, benefits and alternatives for the proposed anesthesia with the patient or authorized representative who has indicated his/her understanding and acceptance.     Dental advisory given and Interpreter used for interveiw  Plan Discussed with: CRNA  Anesthesia Plan Comments:       Anesthesia Quick Evaluation

## 2020-08-26 NOTE — Telephone Encounter (Signed)
TC to patient for presurgery check in.  Spoke to daughter Merideth Abbey. Patient and daughter are aware of all preop instructions and arrival time. Patient/daughter will call with any questions or concerns.

## 2020-08-27 ENCOUNTER — Ambulatory Visit (HOSPITAL_COMMUNITY)
Admission: RE | Admit: 2020-08-27 | Discharge: 2020-08-27 | Disposition: A | Discharge status: Home / Self Care | Payer: Medicare Other | Source: Ambulatory Visit | Attending: Gynecologic Oncology | Admitting: Gynecologic Oncology

## 2020-08-27 ENCOUNTER — Other Ambulatory Visit: Payer: Self-pay

## 2020-08-27 ENCOUNTER — Ambulatory Visit (HOSPITAL_COMMUNITY): Payer: Medicare Other | Admitting: Physician Assistant

## 2020-08-27 ENCOUNTER — Encounter (HOSPITAL_COMMUNITY)
Admission: RE | Disposition: A | Discharge status: Home / Self Care | Payer: Self-pay | Source: Ambulatory Visit | Attending: Gynecologic Oncology

## 2020-08-27 ENCOUNTER — Encounter (HOSPITAL_COMMUNITY): Payer: Self-pay | Admitting: Gynecologic Oncology

## 2020-08-27 DIAGNOSIS — Z8249 Family history of ischemic heart disease and other diseases of the circulatory system: Secondary | ICD-10-CM | POA: Diagnosis not present

## 2020-08-27 DIAGNOSIS — Z79899 Other long term (current) drug therapy: Secondary | ICD-10-CM | POA: Diagnosis not present

## 2020-08-27 DIAGNOSIS — E119 Type 2 diabetes mellitus without complications: Secondary | ICD-10-CM | POA: Diagnosis not present

## 2020-08-27 DIAGNOSIS — Z791 Long term (current) use of non-steroidal anti-inflammatories (NSAID): Secondary | ICD-10-CM | POA: Insufficient documentation

## 2020-08-27 DIAGNOSIS — Z23 Encounter for immunization: Secondary | ICD-10-CM | POA: Diagnosis not present

## 2020-08-27 DIAGNOSIS — Z7984 Long term (current) use of oral hypoglycemic drugs: Secondary | ICD-10-CM | POA: Insufficient documentation

## 2020-08-27 DIAGNOSIS — C775 Secondary and unspecified malignant neoplasm of intrapelvic lymph nodes: Secondary | ICD-10-CM | POA: Diagnosis not present

## 2020-08-27 DIAGNOSIS — Z811 Family history of alcohol abuse and dependence: Secondary | ICD-10-CM | POA: Insufficient documentation

## 2020-08-27 DIAGNOSIS — I10 Essential (primary) hypertension: Secondary | ICD-10-CM | POA: Insufficient documentation

## 2020-08-27 DIAGNOSIS — C541 Malignant neoplasm of endometrium: Secondary | ICD-10-CM

## 2020-08-27 DIAGNOSIS — E7800 Pure hypercholesterolemia, unspecified: Secondary | ICD-10-CM | POA: Diagnosis not present

## 2020-08-27 DIAGNOSIS — E559 Vitamin D deficiency, unspecified: Secondary | ICD-10-CM | POA: Diagnosis not present

## 2020-08-27 DIAGNOSIS — Z803 Family history of malignant neoplasm of breast: Secondary | ICD-10-CM | POA: Diagnosis not present

## 2020-08-27 DIAGNOSIS — Z8489 Family history of other specified conditions: Secondary | ICD-10-CM | POA: Diagnosis not present

## 2020-08-27 HISTORY — PX: ROBOTIC ASSISTED TOTAL HYSTERECTOMY WITH BILATERAL SALPINGO OOPHERECTOMY: SHX6086

## 2020-08-27 HISTORY — PX: SENTINEL NODE BIOPSY: SHX6608

## 2020-08-27 LAB — GLUCOSE, CAPILLARY
Glucose-Capillary: 87 mg/dL (ref 70–99)
Glucose-Capillary: 153 mg/dL — ABNORMAL HIGH (ref 70–99)

## 2020-08-27 LAB — ABO/RH: ABO/RH(D): O POS

## 2020-08-27 LAB — TYPE AND SCREEN
ABO/RH(D): O POS
Antibody Screen: NEGATIVE

## 2020-08-27 SURGERY — HYSTERECTOMY, TOTAL, ROBOT-ASSISTED, LAPAROSCOPIC, WITH BILATERAL SALPINGO-OOPHORECTOMY
Anesthesia: General

## 2020-08-27 MED ORDER — LIDOCAINE HCL (PF) 2 % IJ SOLN
INTRAMUSCULAR | Status: AC
  Filled 2020-08-27: qty 5

## 2020-08-27 MED ORDER — MIDAZOLAM HCL 2 MG/2ML IJ SOLN
INTRAMUSCULAR | Status: DC | PRN
  Administered 2020-08-27: 2 mg via INTRAVENOUS

## 2020-08-27 MED ORDER — LACTATED RINGERS IV SOLN: INTRAVENOUS | Status: DC

## 2020-08-27 MED ORDER — PROPOFOL 10 MG/ML IV BOLUS
INTRAVENOUS | Status: DC | PRN
  Administered 2020-08-27: 70 mg via INTRAVENOUS

## 2020-08-27 MED ORDER — DIPHENHYDRAMINE HCL 50 MG/ML IJ SOLN
INTRAMUSCULAR | Status: DC | PRN
  Administered 2020-08-27: 12.5 mg via INTRAVENOUS

## 2020-08-27 MED ORDER — ORAL CARE MOUTH RINSE: 15.0000 mL | Freq: Once | OROMUCOSAL | Status: AC

## 2020-08-27 MED ORDER — STERILE WATER FOR INJECTION IJ SOLN
INTRAMUSCULAR | Status: AC
  Filled 2020-08-27: qty 10

## 2020-08-27 MED ORDER — HEPARIN SODIUM (PORCINE) 5000 UNIT/ML IJ SOLN
5000.0000 [IU] | INTRAMUSCULAR | Status: AC
  Administered 2020-08-27: 5000 [IU] via SUBCUTANEOUS
  Filled 2020-08-27: qty 1

## 2020-08-27 MED ORDER — FENTANYL CITRATE (PF) 250 MCG/5ML IJ SOLN
INTRAMUSCULAR | Status: DC | PRN
  Administered 2020-08-27 (×3): 50 ug via INTRAVENOUS
  Administered 2020-08-27: 100 ug via INTRAVENOUS

## 2020-08-27 MED ORDER — ROCURONIUM BROMIDE 10 MG/ML (PF) SYRINGE
PREFILLED_SYRINGE | INTRAVENOUS | Status: AC
  Filled 2020-08-27: qty 10

## 2020-08-27 MED ORDER — GABAPENTIN 100 MG PO CAPS
200.0000 mg | ORAL_CAPSULE | ORAL | Status: AC
  Administered 2020-08-27: 200 mg via ORAL
  Filled 2020-08-27: qty 2

## 2020-08-27 MED ORDER — MIDAZOLAM HCL 2 MG/2ML IJ SOLN
INTRAMUSCULAR | Status: AC
  Filled 2020-08-27: qty 2

## 2020-08-27 MED ORDER — STERILE WATER FOR IRRIGATION IR SOLN
Status: DC | PRN
  Administered 2020-08-27: 1000 mL

## 2020-08-27 MED ORDER — ROCURONIUM BROMIDE 10 MG/ML (PF) SYRINGE
PREFILLED_SYRINGE | INTRAVENOUS | Status: DC | PRN
  Administered 2020-08-27: 50 mg via INTRAVENOUS
  Administered 2020-08-27: 10 mg via INTRAVENOUS
  Administered 2020-08-27: 20 mg via INTRAVENOUS

## 2020-08-27 MED ORDER — STERILE WATER FOR INJECTION IJ SOLN
INTRAMUSCULAR | Status: DC | PRN
  Administered 2020-08-27: 10 mL

## 2020-08-27 MED ORDER — LACTATED RINGERS IV SOLN: INTRAVENOUS | Status: DC | PRN

## 2020-08-27 MED ORDER — PROPOFOL 10 MG/ML IV BOLUS
INTRAVENOUS | Status: AC
  Filled 2020-08-27: qty 20

## 2020-08-27 MED ORDER — BUPIVACAINE HCL 0.25 % IJ SOLN
INTRAMUSCULAR | Status: DC | PRN
  Administered 2020-08-27: 20 mL

## 2020-08-27 MED ORDER — ONDANSETRON HCL 4 MG/2ML IJ SOLN
INTRAMUSCULAR | Status: DC | PRN
  Administered 2020-08-27: 4 mg via INTRAVENOUS

## 2020-08-27 MED ORDER — ONDANSETRON HCL 4 MG/2ML IJ SOLN
INTRAMUSCULAR | Status: AC
  Filled 2020-08-27: qty 2

## 2020-08-27 MED ORDER — DIPHENHYDRAMINE HCL 50 MG/ML IJ SOLN
INTRAMUSCULAR | Status: AC
  Filled 2020-08-27: qty 1

## 2020-08-27 MED ORDER — FENTANYL CITRATE (PF) 250 MCG/5ML IJ SOLN
INTRAMUSCULAR | Status: AC
  Filled 2020-08-27: qty 5

## 2020-08-27 MED ORDER — BUPIVACAINE HCL 0.25 % IJ SOLN
INTRAMUSCULAR | Status: AC
  Filled 2020-08-27: qty 1

## 2020-08-27 MED ORDER — SUGAMMADEX SODIUM 200 MG/2ML IV SOLN
INTRAVENOUS | Status: DC | PRN
  Administered 2020-08-27: 160 mg via INTRAVENOUS

## 2020-08-27 MED ORDER — DEXAMETHASONE SODIUM PHOSPHATE 4 MG/ML IJ SOLN: 4.0000 mg | INTRAMUSCULAR | Status: DC

## 2020-08-27 MED ORDER — CEFAZOLIN SODIUM-DEXTROSE 2-4 GM/100ML-% IV SOLN
2.0000 g | INTRAVENOUS | Status: AC
  Administered 2020-08-27: 2 g via INTRAVENOUS
  Filled 2020-08-27: qty 100

## 2020-08-27 MED ORDER — HYDROMORPHONE HCL 1 MG/ML IJ SOLN: 0.2500 mg | INTRAMUSCULAR | Status: DC | PRN

## 2020-08-27 MED ORDER — DEXAMETHASONE SODIUM PHOSPHATE 10 MG/ML IJ SOLN
INTRAMUSCULAR | Status: DC | PRN
  Administered 2020-08-27: 8 mg via INTRAVENOUS

## 2020-08-27 MED ORDER — CHLORHEXIDINE GLUCONATE 0.12 % MT SOLN
15.0000 mL | Freq: Once | OROMUCOSAL | Status: AC
  Administered 2020-08-27: 15 mL via OROMUCOSAL

## 2020-08-27 MED ORDER — LIDOCAINE 2% (20 MG/ML) 5 ML SYRINGE
INTRAMUSCULAR | Status: DC | PRN
  Administered 2020-08-27: 60 mg via INTRAVENOUS

## 2020-08-27 MED ORDER — LACTATED RINGERS IR SOLN
Status: DC | PRN
  Administered 2020-08-27: 1000 mL

## 2020-08-27 MED ORDER — PHENYLEPHRINE 40 MCG/ML (10ML) SYRINGE FOR IV PUSH (FOR BLOOD PRESSURE SUPPORT)
PREFILLED_SYRINGE | INTRAVENOUS | Status: DC | PRN
  Administered 2020-08-27: 80 ug via INTRAVENOUS

## 2020-08-27 MED ORDER — ACETAMINOPHEN 500 MG PO TABS: 1000.0000 mg | ORAL_TABLET | Freq: Once | ORAL | Status: DC

## 2020-08-27 MED ORDER — DEXAMETHASONE SODIUM PHOSPHATE 10 MG/ML IJ SOLN
INTRAMUSCULAR | Status: AC
  Filled 2020-08-27: qty 1

## 2020-08-27 MED ORDER — ACETAMINOPHEN 500 MG PO TABS
1000.0000 mg | ORAL_TABLET | ORAL | Status: AC
  Administered 2020-08-27: 1000 mg via ORAL
  Filled 2020-08-27: qty 2

## 2020-08-27 SURGICAL SUPPLY — 69 items
ADH SKN CLS APL DERMABOND .7 (GAUZE/BANDAGES/DRESSINGS) ×1
AGENT HMST KT MTR STRL THRMB (HEMOSTASIS)
APL ESCP 34 STRL LF DISP (HEMOSTASIS)
APPLICATOR SURGIFLO ENDO (HEMOSTASIS) IMPLANT
BACTOSHIELD CHG 4% 4OZ (MISCELLANEOUS) ×1
BAG LAPAROSCOPIC 12 15 PORT 16 (BASKET) IMPLANT
BAG RETRIEVAL 12/15 (BASKET)
BAG SPEC RTRVL LRG 6X4 10 (ENDOMECHANICALS)
BLADE SURG SZ10 CARB STEEL (BLADE) IMPLANT
COVER BACK TABLE 60X90IN (DRAPES) ×2 IMPLANT
COVER TIP SHEARS 8 DVNC (MISCELLANEOUS) ×1 IMPLANT
COVER TIP SHEARS 8MM DA VINCI (MISCELLANEOUS) ×2
COVER WAND RF STERILE (DRAPES) IMPLANT
DECANTER SPIKE VIAL GLASS SM (MISCELLANEOUS) IMPLANT
DERMABOND ADVANCED (GAUZE/BANDAGES/DRESSINGS) ×1
DERMABOND ADVANCED .7 DNX12 (GAUZE/BANDAGES/DRESSINGS) ×1 IMPLANT
DRAPE ARM DVNC X/XI (DISPOSABLE) ×4 IMPLANT
DRAPE COLUMN DVNC XI (DISPOSABLE) ×1 IMPLANT
DRAPE DA VINCI XI ARM (DISPOSABLE) ×8
DRAPE DA VINCI XI COLUMN (DISPOSABLE) ×2
DRAPE SHEET LG 3/4 BI-LAMINATE (DRAPES) ×2 IMPLANT
DRAPE SURG IRRIG POUCH 19X23 (DRAPES) ×2 IMPLANT
DRSG OPSITE POSTOP 4X6 (GAUZE/BANDAGES/DRESSINGS) IMPLANT
DRSG OPSITE POSTOP 4X8 (GAUZE/BANDAGES/DRESSINGS) IMPLANT
ELECT REM PT RETURN 15FT ADLT (MISCELLANEOUS) ×2 IMPLANT
GLOVE SURG ENC MOIS LTX SZ6 (GLOVE) ×8 IMPLANT
GLOVE SURG ENC MOIS LTX SZ6.5 (GLOVE) ×4 IMPLANT
GOWN STRL REUS W/ TWL LRG LVL3 (GOWN DISPOSABLE) ×4 IMPLANT
GOWN STRL REUS W/TWL LRG LVL3 (GOWN DISPOSABLE) ×8
HOLDER FOLEY CATH W/STRAP (MISCELLANEOUS) ×2 IMPLANT
IRRIG SUCT STRYKERFLOW 2 WTIP (MISCELLANEOUS) ×2
IRRIGATION SUCT STRKRFLW 2 WTP (MISCELLANEOUS) ×1 IMPLANT
KIT PROCEDURE DA VINCI SI (MISCELLANEOUS)
KIT PROCEDURE DVNC SI (MISCELLANEOUS) IMPLANT
KIT TURNOVER KIT A (KITS) IMPLANT
MANIPULATOR UTERINE 4.5 ZUMI (MISCELLANEOUS) ×2 IMPLANT
NDL HYPO 21X1.5 SAFETY (NEEDLE) ×1 IMPLANT
NDL SPNL 18GX3.5 QUINCKE PK (NEEDLE) IMPLANT
NEEDLE HYPO 21X1.5 SAFETY (NEEDLE) ×2 IMPLANT
NEEDLE SPNL 18GX3.5 QUINCKE PK (NEEDLE) IMPLANT
OBTURATOR OPTICAL STANDARD 8MM (TROCAR) ×2
OBTURATOR OPTICAL STND 8 DVNC (TROCAR) ×1
OBTURATOR OPTICALSTD 8 DVNC (TROCAR) ×1 IMPLANT
PACK ROBOT GYN CUSTOM WL (TRAY / TRAY PROCEDURE) ×2 IMPLANT
PAD POSITIONING PINK XL (MISCELLANEOUS) ×2 IMPLANT
PENCIL SMOKE EVACUATOR (MISCELLANEOUS) IMPLANT
PORT ACCESS TROCAR AIRSEAL 12 (TROCAR) ×1 IMPLANT
PORT ACCESS TROCAR AIRSEAL 5M (TROCAR) ×1
POUCH SPECIMEN RETRIEVAL 10MM (ENDOMECHANICALS) IMPLANT
SCRUB CHG 4% DYNA-HEX 4OZ (MISCELLANEOUS) ×1 IMPLANT
SEAL CANN UNIV 5-8 DVNC XI (MISCELLANEOUS) ×4 IMPLANT
SEAL XI 5MM-8MM UNIVERSAL (MISCELLANEOUS) ×8
SET TRI-LUMEN FLTR TB AIRSEAL (TUBING) ×2 IMPLANT
SPONGE LAP 18X18 RF (DISPOSABLE) IMPLANT
SURGIFLO W/THROMBIN 8M KIT (HEMOSTASIS) IMPLANT
SUT MNCRL AB 4-0 PS2 18 (SUTURE) IMPLANT
SUT PDS AB 1 TP1 96 (SUTURE) IMPLANT
SUT VIC AB 0 CT1 27 (SUTURE)
SUT VIC AB 0 CT1 27XBRD ANTBC (SUTURE) IMPLANT
SUT VIC AB 2-0 CT1 27 (SUTURE)
SUT VIC AB 2-0 CT1 TAPERPNT 27 (SUTURE) IMPLANT
SUT VIC AB 4-0 PS2 18 (SUTURE) ×4 IMPLANT
SYR 10ML LL (SYRINGE) IMPLANT
TOWEL OR NON WOVEN STRL DISP B (DISPOSABLE) ×2 IMPLANT
TRAP SPECIMEN MUCUS 40CC (MISCELLANEOUS) IMPLANT
TRAY FOLEY MTR SLVR 16FR STAT (SET/KITS/TRAYS/PACK) ×2 IMPLANT
TROCAR XCEL NON-BLD 5MMX100MML (ENDOMECHANICALS) IMPLANT
UNDERPAD 30X36 HEAVY ABSORB (UNDERPADS AND DIAPERS) ×2 IMPLANT
WATER STERILE IRR 1000ML POUR (IV SOLUTION) ×2 IMPLANT

## 2020-08-27 NOTE — Anesthesia Postprocedure Evaluation (Signed)
Anesthesia Post Note  Patient: Julie Orozco  Procedure(s) Performed: XI ROBOTIC ASSISTED TOTAL HYSTERECTOMY WITH BILATERAL SALPINGO OOPHORECTOMY, (N/A ) SENTINEL LYMPH NODE BIOPSY (N/A )     Patient location during evaluation: PACU Anesthesia Type: General Level of consciousness: awake and alert Pain management: pain level controlled Vital Signs Assessment: post-procedure vital signs reviewed and stable Respiratory status: spontaneous breathing, nonlabored ventilation and respiratory function stable Cardiovascular status: blood pressure returned to baseline and stable Postop Assessment: no apparent nausea or vomiting Anesthetic complications: no   No complications documented.  Last Vitals:  Vitals:   08/27/20 1121 08/27/20 1240  BP: 134/66 (!) 130/54  Pulse: 71 60  Resp: 12 12  Temp: (!) 36.4 C 36.6 C  SpO2: 97% 98%    Last Pain:  Vitals:   08/27/20 1240  TempSrc: Oral  PainSc:                  Autie Vasudevan,W. EDMOND

## 2020-08-27 NOTE — Discharge Instructions (Signed)
08/27/2020  Activity: 1. Be up and out of the bed during the day.  Take a nap if needed.  You may walk up steps but be careful and use the hand rail.  Stair climbing will tire you more than you think, you may need to stop part way and rest.   2. No lifting or straining for 6 weeks.  3. No driving for 1 weeks.  Do Not drive if you are taking narcotic pain medicine.  4. Shower daily.  Use soap and water on your incision and pat dry; don't rub.   5. No sexual activity and nothing in the vagina for 8 weeks.  Medications:  - Take ibuprofen and tylenol first line for pain control. Take these regularly (every 6 hours) to decrease the build up of pain.  - If necessary, for severe pain not relieved by ibuprofen, take tramadol.  - While taking tramadol you should take sennakot every night to reduce the likelihood of constipation. If this causes diarrhea, stop its use.  Diet: 1. Low sodium Heart Healthy Diet is recommended.  2. It is safe to use a laxative if you have difficulty moving your bowels.   Wound Care: 1. Keep clean and dry.  Shower daily.  Reasons to call the Doctor:   Fever - Oral temperature greater than 100.4 degrees Fahrenheit  Foul-smelling vaginal discharge  Difficulty urinating  Nausea and vomiting  Increased pain at the site of the incision that is unrelieved with pain medicine.  Difficulty breathing with or without chest pain  New calf pain especially if only on one side  Sudden, continuing increased vaginal bleeding with or without clots.   Follow-up: 1. See Eugene Garnet in 3 weeks. You will have a phone visit in 1 week.  Contacts: For questions or concerns you should contact:  Dr. Eugene Garnet at [phone] After hours and on week-ends call [phone] and ask to speak to the physician on call for Gynecologic Oncology

## 2020-08-27 NOTE — Transfer of Care (Signed)
Immediate Anesthesia Transfer of Care Note  Patient: Julie Orozco  Procedure(s) Performed: XI ROBOTIC ASSISTED TOTAL HYSTERECTOMY WITH BILATERAL SALPINGO OOPHORECTOMY, (N/A ) SENTINEL LYMPH NODE BIOPSY (N/A )  Patient Location: PACU  Anesthesia Type:General  Level of Consciousness: drowsy  Airway & Oxygen Therapy: Patient Spontanous Breathing and Patient connected to face mask oxygen  Post-op Assessment: Report given to RN and Post -op Vital signs reviewed and stable  Post vital signs: Reviewed and stable  Last Vitals:  Vitals Value Taken Time  BP 139/84 08/27/20 1019  Temp    Pulse 86 08/27/20 1020  Resp 12 08/27/20 1020  SpO2 100 % 08/27/20 1020  Vitals shown include unvalidated device data.  Last Pain:  Vitals:   08/27/20 0635  PainSc: 0-No pain         Complications: No complications documented.

## 2020-08-27 NOTE — Anesthesia Procedure Notes (Signed)
Procedure Name: Intubation Date/Time: 08/27/2020 7:43 AM Performed by: Florene Route, CRNA Patient Re-evaluated:Patient Re-evaluated prior to induction Oxygen Delivery Method: Circle system utilized Preoxygenation: Pre-oxygenation with 100% oxygen Induction Type: IV induction Ventilation: Mask ventilation without difficulty and Oral airway inserted - appropriate to patient size Laryngoscope Size: Hyacinth Meeker and 2 Grade View: Grade I Tube type: Oral Tube size: 7.5 mm Number of attempts: 1 Airway Equipment and Method: Stylet Placement Confirmation: ETT inserted through vocal cords under direct vision,  positive ETCO2 and breath sounds checked- equal and bilateral Secured at: 21 cm Tube secured with: Tape Dental Injury: Teeth and Oropharynx as per pre-operative assessment

## 2020-08-27 NOTE — Interval H&P Note (Signed)
History and Physical Interval Note:  08/27/2020 6:59 AM  Julie Orozco  has presented today for surgery, with the diagnosis of ENDOMETRIAL CANCER.  The various methods of treatment have been discussed with the patient and family. After consideration of risks, benefits and other options for treatment, the patient has consented to  Procedure(s): XI ROBOTIC ASSISTED TOTAL HYSTERECTOMY WITH BILATERAL SALPINGO OOPHORECTOMY, POSSIBLE LAPAROTOMY (N/A) SENTINEL LYMPH NODE BIOPSY POSSIBLE LYMPH NODE DISSECTION (N/A) as a surgical intervention.  The patient's history has been reviewed, patient examined, no change in status, stable for surgery.  I have reviewed the patient's chart and labs.  Questions were answered to the patient's satisfaction.     Carver Fila

## 2020-08-27 NOTE — Op Note (Signed)
OPERATIVE NOTE  Pre-operative Diagnosis: Grade 1 endometrioid adenocarcinoma  Post-operative Diagnosis: same as above  Operation: Robotic-assisted laparoscopic total hysterectomy with bilateral salpingoophorectomy, SLN biopsy   Surgeon: Eugene Garnet MD  Assistant Surgeon: Warner Mccreedy, NP  Anesthesia: GET  Urine Output: 250cc  Operative Findings: On EUA, small mobile uterus, no nodularity. ON intra-abdominal inspection, normal upper abdominal survey. Normal appearing small and large bowel, appendix. Uterus 8cm, adnexa normal in appearance. Some retroperitoneal fibrosis, left more than right. No obvious intra-abdominal or pelvic evidence of disease. Mapping successful to right obturator and external iliac SLN, left obturator SLN.  Estimated Blood Loss:  75cc      Total IV Fluids: see I&O flowsheet         Specimens: uterus, cervix, bilateral tubes and ovaries, SLNs (described above)         Complications:  None apparent; patient tolerated the procedure well.         Disposition: PACU - hemodynamically stable.  Procedure Details  The patient was seen in the Holding Room. The risks, benefits, complications, treatment options, and expected outcomes were discussed with the patient.  The patient concurred with the proposed plan, giving informed consent.  The site of surgery properly noted/marked. The patient was identified as Julie Orozco and the procedure verified as a Robotic-assisted hysterectomy with bilateral salpingo oophorectomy with SLN biopsy.   After induction of anesthesia, the patient was draped and prepped in the usual sterile manner. Patient was placed in supine position after anesthesia and draped and prepped in the usual sterile manner as follows: Her arms were tucked to her side with all appropriate precautions.  The shoulders were stabilized with padded shoulder blocks applied to the acromium processes.  The patient was placed in the semi-lithotomy position  in [place] stirrups.  The perineum and vagina were prepped with CholoraPrep. The patient was draped after the CholoraPrep had been allowed to dry for 3 minutes.  A Time Out was held and the above information confirmed.  The urethra was prepped with Betadine. Foley catheter was placed.  A sterile speculum was placed in the vagina.  The cervix was grasped with a single-tooth tenaculum. The cervix was dilated with Shawnie Pons dilators.  The ZUMI uterine manipulator with a medium colpotomizer ring was placed without difficulty.  A pneum occluder balloon was placed over the manipulator.  OG tube placement was confirmed and to suction.   Next, a 10 mm skin incision was made 1 cm below the subcostal margin in the midclavicular line.  The 5 mm Optiview port and scope was used for direct entry.  Opening pressure was under 10 mm CO2.  The abdomen was insufflated and the findings were noted as above.   At this point and all points during the procedure, the patient's intra-abdominal pressure did not exceed 15 mmHg. Next, an 8 mm skin incision was made superior to the umbilicus and a right and left port were placed about 8 cm lateral to the robot port on the right and left side.  A fourth arm was placed on the right.  The 5 mm assist trocar was exchanged for a 10-12 mm port. All ports were placed under direct visualization.  The patient was placed in steep Trendelenburg.  Bowel was folded away into the upper abdomen.  The robot was docked in the normal manner.  The right and left peritoneum were opened parallel to the IP ligament to open the retroperitoneal spaces bilaterally. The round ligaments were transected. The SLN  mapping was performed in bilateral pelvic basins. After identifying the ureters, the para rectal and paravesical spaces were opened up entirely with careful dissection below the level of the ureters bilaterally and to the depth of the uterine artery origin in order to skeletonize the uterine "web" and ensure  visualization of all parametrial channels. The para-aortic basins were carefully exposed and evaluated for isolated para-aortic SLN's. Lymphatic channels were identified travelling to the following visualized sentinel lymph node's: right obturator and external iliac, left obturator. These SLN's were separated from their surrounding lymphatic tissue, removed and sent for permanent pathology.  The hysterectomy was started.  The ureter was again noted to be on the medial leaf of the broad ligament.  The peritoneum above the ureter was incised and stretched and the infundibulopelvic ligament was skeletonized, cauterized and cut.  The posterior peritoneum was taken down to the level of the KOH ring.  The anterior peritoneum was also taken down.  The bladder flap was created to the level of the KOH ring.  The uterine artery on the right side was skeletonized, cauterized and cut in the normal manner.  A similar procedure was performed on the left.  The colpotomy was made and the uterus, cervix, bilateral ovaries and tubes were amputated and delivered through the vagina.  Pedicles were inspected and excellent hemostasis was achieved.    The colpotomy at the vaginal cuff was closed with Vicryl on a CT1 needle in a running manner.  Irrigation was used and excellent hemostasis was achieved.  At this point in the procedure was completed.  Robotic instruments were removed under direct visulaization.  The robot was undocked. The fascia at the 10-12 mm port was closed with 0 Vicryl on a UR-5 needle.  The subcuticular tissue was closed with 4-0 Vicryl and the skin was closed with 4-0 Monocryl in a subcuticular manner.  Dermabond was applied.    The vagina was swabbed with  minimal bleeding noted.   All sponge, lap and needle counts were correct x  3. Foley catheter was removed.  The patient was transferred to the recovery room in stable condition.  Jeral Pinch, MD

## 2020-08-28 ENCOUNTER — Ambulatory Visit: Payer: Medicare Other | Admitting: Gynecologic Oncology

## 2020-08-28 ENCOUNTER — Encounter (HOSPITAL_COMMUNITY): Payer: Self-pay | Admitting: Gynecologic Oncology

## 2020-08-28 ENCOUNTER — Telehealth: Payer: Self-pay

## 2020-08-28 NOTE — Telephone Encounter (Signed)
Daughter  interpreted for patient as she speaks spanish. Ms Ruiz-Ramos states that she is eating,drinking, and urinating well. She is not passing gas. Daughter to p/u senokot-s.  Pt to take 2 tabs with 8 oz of water bid until good BM. Afebrile. Incisions D&I The left upper incision under the ribcage is slightly redder then the the others.  No purulent drainage noted. Pt to monitor area and call if incision redness increases and or has pussy drainage or temp 100.4 or greater.  Pt aware of post op appointments as well as the office number [phone] and after hours number [phone] to call if she has any questions or concerns

## 2020-09-02 ENCOUNTER — Inpatient Hospital Stay: Payer: Medicare Other | Attending: Gynecologic Oncology | Admitting: Gynecologic Oncology

## 2020-09-02 ENCOUNTER — Other Ambulatory Visit: Payer: Self-pay

## 2020-09-02 DIAGNOSIS — R3121 Asymptomatic microscopic hematuria: Secondary | ICD-10-CM | POA: Diagnosis not present

## 2020-09-02 DIAGNOSIS — Z90722 Acquired absence of ovaries, bilateral: Secondary | ICD-10-CM

## 2020-09-02 DIAGNOSIS — Z90710 Acquired absence of both cervix and uterus: Secondary | ICD-10-CM

## 2020-09-02 DIAGNOSIS — C541 Malignant neoplasm of endometrium: Secondary | ICD-10-CM

## 2020-09-03 ENCOUNTER — Encounter: Payer: Self-pay | Admitting: Gynecologic Oncology

## 2020-09-03 ENCOUNTER — Other Ambulatory Visit: Payer: Self-pay | Admitting: Gynecologic Oncology

## 2020-09-03 ENCOUNTER — Telehealth: Payer: Self-pay | Admitting: Oncology

## 2020-09-03 DIAGNOSIS — C541 Malignant neoplasm of endometrium: Secondary | ICD-10-CM

## 2020-09-03 LAB — SURGICAL PATHOLOGY

## 2020-09-03 NOTE — Telephone Encounter (Signed)
Called daughter Amparo Bristol) and advised her of appointment for CT scan on 09/16/20 at 8:30 (pick up contrast, drink first bottle at 6:30 and second at 7:30, NPO 4 hours before) and to follow up with Dr. Pricilla Holm at 2:00 that day.  She verbalized understanding and agreement.

## 2020-09-03 NOTE — Progress Notes (Signed)
Gynecologic Oncology Telehealth Consult Note: Gyn-Onc  I connected with Julie Orozco on 09/03/20 at  3:15 PM EST by telephone and verified that I am speaking with the correct person using two identifiers.  I discussed the limitations, risks, security and privacy concerns of performing an evaluation and management service by telemedicine and the availability of in-person appointments. I also discussed with the patient that there may be a patient responsible charge related to this service. The patient expressed understanding and agreed to proceed.  Other persons participating in the visit and their role in the encounter: daughter.  Patient's location: home Provider's location: [facility]  Reason for Visit: discussion of pathology and follow-up after recent surgery  Treatment History: Oncology History  Endometrial adenocarcinoma ([place])   Initial Diagnosis   Endometrial adenocarcinoma ([place])     Interval History: Doing well since surgery last week. Had two episodes of spotting when wiped after voiding. Tolerating PO without nausea or emesis. + flatus, using sennakot to help with constipation. Denies urinary issues.   Past Medical/Surgical History: Past Medical History:  Diagnosis Date  . DM type 2 (diabetes mellitus, type 2) ([place])   . Elevated ALT measurement 04/18/2018   resolved  . Endometrial cancer ([place])   . Hyperlipidemia   . Hypertension   . Obesity   . Osteopenia determined by x-ray   . PMB (postmenopausal bleeding)   . SVD (spontaneous vaginal delivery)    x 8    Past Surgical History:  Procedure Laterality Date  . DILATATION & CURETTAGE/HYSTEROSCOPY WITH MYOSURE N/A 07/18/2020   Procedure: DILATATION & CURETTAGE/HYSTEROSCOPY WITH MYOSURE;  Surgeon: Princess Bruins, MD;  Location: [place];  Service: Gynecology;  Laterality: N/A;  . DILATION AND CURETTAGE OF UTERUS N/A 01/25/2014   Procedure: DILATATION AND CURETTAGE;  Surgeon: Emily Filbert, MD;   Location: [facility];  Service: Gynecology;  Laterality: N/A;  . MULTIPLE TOOTH EXTRACTIONS  yrs ago  . ROBOTIC ASSISTED TOTAL HYSTERECTOMY WITH BILATERAL SALPINGO OOPHERECTOMY N/A 08/27/2020   Procedure: XI ROBOTIC ASSISTED TOTAL HYSTERECTOMY WITH BILATERAL SALPINGO OOPHORECTOMY,;  Surgeon: Lafonda Mosses, MD;  Location: [facility];  Service: Gynecology;  Laterality: N/A;  . SENTINEL NODE BIOPSY N/A 08/27/2020   Procedure: SENTINEL LYMPH NODE BIOPSY;  Surgeon: Lafonda Mosses, MD;  Location: [facility];  Service: Gynecology;  Laterality: N/A;  . TUBAL LIGATION  yrs ago    Family History  Problem Relation Age of Onset  . Hypertension Mother   . Alcohol abuse Father   . Anemia Brother   . Alcohol abuse Brother   . Breast cancer Cousin        maternal cousin  . Colon cancer Neg Hx   . Esophageal cancer Neg Hx   . Rectal cancer Neg Hx   . Stomach cancer Neg Hx   . Ovarian cancer Neg Hx     Social History   Socioeconomic History  . Marital status: Divorced    Spouse name: Not on file  . Number of children: Not on file  . Years of education: Not on file  . Highest education level: Not on file  Occupational History  . Not on file  Tobacco Use  . Smoking status: Never Smoker  . Smokeless tobacco: Never Used  Vaping Use  . Vaping Use: Never used  Substance and Sexual Activity  . Alcohol use: No  . Drug use: No  . Sexual activity: Not Currently    Birth control/protection: Post-menopausal  Other Topics Concern  .  Not on file  Social History Narrative  . Not on file   Social Determinants of Health   Financial Resource Strain: Not on file  Food Insecurity: Not on file  Transportation Needs: Not on file  Physical Activity: Not on file  Stress: Not on file  Social Connections: Not on file    Current Medications:  Current Outpatient Medications:  .  acetaminophen (TYLENOL) 500 MG tablet, Take 500-1,000 mg by mouth every 6 (six) hours as needed (pain.)., Disp: , Rfl:  .   Cholecalciferol (VITAMIN D3) 25 MCG (1000 UT) CAPS, Take 1,000 Units by mouth daily., Disp: , Rfl:  .  ibuprofen (ADVIL) 200 MG tablet, Take 600 mg by mouth every 8 (eight) hours as needed (pain.)., Disp: , Rfl:  .  ibuprofen (ADVIL) 600 MG tablet, Take 1 tablet (600 mg total) by mouth every 6 (six) hours as needed for moderate pain. For AFTER surgery only, Disp: 30 tablet, Rfl: 0 .  lisinopril (ZESTRIL) 10 MG tablet, Take 1 tablet by mouth once daily, Disp: 90 tablet, Rfl: 0 .  metFORMIN (GLUCOPHAGE) 500 MG tablet, Take 1 tablet (500 mg total) by mouth daily with breakfast., Disp: 90 tablet, Rfl: 1 .  senna-docusate (SENOKOT-S) 8.6-50 MG tablet, Take 2 tablets by mouth at bedtime. For AFTER surgery, do not take if having diarrhea, Disp: 30 tablet, Rfl: 0 .  simvastatin (ZOCOR) 20 MG tablet, Take 1 tablet (20 mg total) by mouth at bedtime., Disp: 90 tablet, Rfl: 1 .  traMADol (ULTRAM) 50 MG tablet, Take 1 tablet (50 mg total) by mouth every 6 (six) hours as needed for moderate pain. For AFTER surgery only, do not take and drive, Disp: 10 tablet, Rfl: 0  Review of Symptoms: Pertinent positives as per HPI.  Physical Exam: There were no vitals taken for this visit. Deferred given limitations of phone visit  Laboratory & Radiologic Studies: FINAL MICROSCOPIC DIAGNOSIS:   A. SENTINEL LYMPH NODE, RIGHT OBTURATOR, BIOPSY:  - Lymph node, negative for carcinoma (0/1)   B. SENTINEL LYMPH NODE, RIGHT EXTERNAL ILIAC, BIOPSY:  - Lymph node, negative for carcinoma (0/1)   C. 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 UTERUS, CERVIX, BILATERAL TUBES AND OVARIES:  - Endometrioid adenocarcinoma, FIGO grade 2, see comment  - Carcinoma invades for a depth of 0.6 cm with a myometrial thickness is  2.0 cm  - Benign unremarkable cervix  - Benign bilateral fallopian tubes and ovaries  - See oncology table    COMMENT:   C.   Immunostain for pancytokeratin (AE1/AE3) highlights focus of  metastatic carcinoma.   D. Immunohistochemical stains show that the tumor cells show wild-type  (normal) expression pattern for p53 and are negative for p16, consistent  with above interpretation. The tumor shows 2 different morphologies  with the majority tumor showing a FIGO grade 1 endometrial  adenocarcinoma and a second component with FIGO grade 2 features. Dr.  Tresa Moore has reviewed part C and D and concurs with the diagnosis. Dr.  Berline Lopes was notified on 09/02/2020.      ONCOLOGY TABLE:   UTERUS, CARCINOMA OR CARCINOSARCOMA: Resection   Procedure: Total hysterectomy and bilateral salpingo-oophorectomy  Histologic Type: Endometrioid adenocarcinoma  Histologic Grade: FIGO grade 2  Myometrial Invasion:    Depth of Myometrial Invasion (mm): 6 mm    Myometrial Thickness (mm): 20 mm    Percentage of Myometrial Invasion: Less than 50%  Uterine  Serosa Involvement: Not identified  Cervical stromal Involvement: Not identified  Extent of involvement of other tissue/organs: Not identified  Peritoneal/Ascitic Fluid: Not applicable  Lymphovascular Invasion: Not identified  Regional Lymph Nodes:    Pelvic Lymph Nodes Examined:  3 Sentinel       0 Non-sentinel       3 Total    Pelvic Lymph Nodes with Metastasis: 1       Macrometastasis: (>2.0 mm): 0       Micrometastasis: (>0.2 mm and < 2.0 mm): 1       Isolated Tumor Cells (<0.2 mm): 0       Laterality of Lymph Node with Tumor: Left       Extracapsular Extension: Not identified    Para-aortic Lymph Nodes Examined:        0 Sentinel        0 Non-sentinel        0 Total  Distant Metastasis:    Distant Site(s) Involved: Not applicable  Pathologic Stage Classification (pTNM, AJCC 8th Edition): pT1a, pN73m  Ancillary Studies: MMR / MSI testing will be ordered  Representative Tumor Block: D5   Comment(s): Pancytokeratin was performed on the lymph nodes.  Assessment & Plan: CLulla Linvilleis a 75y.o. woman with Stage IIIC endometrioid adenocarcinoma, grade 2 who presents for discussion after surgery.  Meeting post-op milestones. Discussed continued expectations and limitations. Reviewed pathology and likely adjuvant treatment - awaiting MMR results.   I discussed the assessment and treatment plan with the patient. The patient was provided with an opportunity to ask questions and all were answered. The patient agreed with the plan and demonstrated an understanding of the instructions.   The patient was advised to call back or see an in-person evaluation if the symptoms worsen or if the condition fails to improve as anticipated.   16 minutes of total time was spent for this patient encounter, including preparation, face-to-face counseling with the patient and coordination of care, and documentation of the encounter.   KJeral Pinch MD  Division of Gynecologic Oncology  Department of Obstetrics and Gynecology  [facility]

## 2020-09-08 ENCOUNTER — Other Ambulatory Visit: Payer: Self-pay | Admitting: Oncology

## 2020-09-08 ENCOUNTER — Telehealth: Payer: Self-pay | Admitting: Oncology

## 2020-09-08 DIAGNOSIS — C541 Malignant neoplasm of endometrium: Secondary | ICD-10-CM

## 2020-09-08 NOTE — Telephone Encounter (Signed)
Called Violetta (daughter) and advised her of the GYN cancer conference recommendations for radiation therapy.  Notified her of appointment with Dr. Sondra Come on 09/17/2020 at 3:00 and also reviewed her other upcoming appointments.

## 2020-09-08 NOTE — Progress Notes (Signed)
Gynecologic Oncology Multi-Disciplinary Disposition Conference Note  Date of the Conference: 09/08/2020  Patient Name: Julie Orozco  Referring Provider: Dr. Dellis Filbert Primary GYN Oncologist: Dr. Berline Lopes  Stage/Disposition:  Stage III endometrioid adenocarcinoma with LVSI. Disposition is to external beam radiation therapy to the pelvis.   This Multidisciplinary conference took place involving physicians from [place], [place], Radiation Oncology, Pathology, Radiology along with the Gynecologic Oncology Nurse Practitioner and RN.  Comprehensive assessment of the patient's malignancy, staging, need for surgery, chemotherapy, radiation therapy, and need for further testing were reviewed. Supportive measures, both inpatient and following discharge were also discussed. The recommended plan of care is documented. Greater than 35 minutes were spent correlating and coordinating this patient's care.

## 2020-09-09 ENCOUNTER — Encounter: Payer: Medicare Other | Admitting: Gynecologic Oncology

## 2020-09-11 NOTE — Progress Notes (Signed)
GYN Location of Tumor / Histology: Endometrial adenocarcinoma   Julie Orozco presented with symptoms of:   Biopsies revealed:08/27/2020    SURGICAL PATHOLOGY    Reason for Addendum #1: DNA Mismatch Repair IHC Results   Clinical History: Endometrial cancer ([facility])      FINAL MICROSCOPIC DIAGNOSIS:   A. SENTINEL LYMPH NODE, RIGHT OBTURATOR, BIOPSY:  - Lymph node, negative for carcinoma (0/1)   B. SENTINEL LYMPH NODE, RIGHT EXTERNAL ILIAC, BIOPSY:  - Lymph node, negative for carcinoma (0/1)   C. 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 UTERUS, CERVIX, BILATERAL TUBES AND OVARIES:  - Endometrioid adenocarcinoma, FIGO grade 2, see comment  - Carcinoma invades for a depth of 0.6 cm with a myometrial thickness is  2.0 cm  - Benign unremarkable cervix  - Benign bilateral fallopian tubes and ovaries  - See oncology table    COMMENT:   C.  Immunostain for pancytokeratin (AE1/AE3) highlights focus of  metastatic carcinoma.   D. Immunohistochemical stains show that the tumor cells show wild-type  (normal) expression pattern for p53 and are negative for p16, consistent  with above interpretation. The tumor shows 2 different morphologies  with the majority tumor showing a FIGO grade 1 endometrial  adenocarcinoma and a second component with FIGO grade 2 features. Dr.  Tresa Moore has reviewed part C and D and concurs with the diagnosis. Dr.  Berline Lopes was notified on 09/02/2020.      ONCOLOGY TABLE:   UTERUS, CARCINOMA OR CARCINOSARCOMA: Resection   Procedure: Total hysterectomy and bilateral salpingo-oophorectomy  Histologic Type: Endometrioid adenocarcinoma  Histologic Grade: FIGO grade 2  Myometrial Invasion:    Depth of Myometrial Invasion (mm): 6 mm    Myometrial Thickness (mm): 20 mm    Percentage of Myometrial Invasion: Less than 50%  Uterine Serosa Involvement: Not identified   Cervical stromal Involvement: Not identified  Extent of involvement of other tissue/organs: Not identified  Peritoneal/Ascitic Fluid: Not applicable  Lymphovascular Invasion: Not identified  Regional Lymph Nodes:    Pelvic Lymph Nodes Examined:  3 Sentinel       0 Non-sentinel       3 Total    Pelvic Lymph Nodes with Metastasis: 1       Macrometastasis: (>2.0 mm): 0       Micrometastasis: (>0.2 mm and < 2.0 mm): 1       Isolated Tumor Cells (<0.2 mm): 0       Laterality of Lymph Node with Tumor: Left       Extracapsular Extension: Not identified    Para-aortic Lymph Nodes Examined:        0 Sentinel        0 Non-sentinel        0 Total  Distant Metastasis:    Distant Site(s) Involved: Not applicable  Pathologic Stage Classification (pTNM, AJCC 8th Edition): pT1a, pN17mi  Ancillary Studies: MMR / MSI testing will be ordered  Representative Tumor Block: D5  Comment(s): Pancytokeratin was performed on the lymph nodes.   (v4.2.0.1)      GROSS DESCRIPTION:   A: Received fresh is a 1.0 x 0.9 x 0.6 m rubbery tan-yellow nodule. The  specimen is sectioned and entirely submitted in 1 cassette.   B: Received fresh is a 1.5 x 0.9 x 0.7 cm rubbery tan-yellow nodule.  The specimen is sectioned and entirely submitted in 1 cassette.   C: Received fresh is  a 1.5 x 1.0 x 0.8 cm rubbery tan-yellow nodule.  The specimen is sectioned and entirely submitted in 1 cassette.   D: Specimen: TAH/BSO, received fresh  Specimen integrity: Intact  Size and shape: 8.9 x 5.7 x 4.8 cm, symmetrical uterus  Weight: The uterus weighs 93 g.  Serosa: Smooth and tan  Cervix: The ectocervical mucosa is smooth, tan-red and the external os  is patent measuring 1.1 cm. The endocervical mucosa is glistening  tan-pink.  Endometrium: The endometrial cavity measures 5.3 x 3.7 cm. There is a  3.5 x 3.2 cm firm tan-red nodular mass present  on the posterior wall  which measures 1.2 cm maximum thickness. The tumor involves the  myometrium to a maximum depth of 0.3 cm where the myometrium measures  2.0 cm in thickness.  Myometrium:The myometrium measures up to 2.4 cm in thickness.  Right adnexa: The right ovary is intact and measures 2.8 x 1.5 x 1 cm.  The cortical and cut surfaces are unremarkable. The right fallopian  tube measures 6.7 cm in length and 0.6 cm in diameter. The fimbriated  end is blunted.  Left adnexa: The left ovary is intact and measures 2.5 x 1.3 x 1 cm.  The cortical and cut surfaces are unremarkable. The left fallopian tube  measures 6.5 cm in length and 0.6 cm in diameter. The fimbriated end is  blunted.  Block Summary:  16 blocks submitted  1 = cervix  2 = anterior lower uterine segment  3 = posterior lower uterine segment  4-10 = endometrial tumor  11, 12 = uninvolved endomyometrium  13, 14 = right adnexa  15, 16 = left adnexa (GRP 08/27/2020)    Final Diagnosis performed by Holley Bouche, MD.  Electronically  signed 09/02/2020  Technical and / or Professional components performed at [facility], [address] [place], [place] [address].  Immunohistochemistry Technical component (if applicable) was performed  at [facility]. [address], [address],  [place], [place] [address].  IMMUNOHISTOCHEMISTRY DISCLAIMER (if applicable):  Some of these immunohistochemical stains may have been developed and the  performance characteristics determine by [facility]. Some  may not have been cleared or approved by the U.S. Food and Drug  Administration. The FDA has determined that such clearance or approval  is not necessary. This test is used for clinical purposes. It should not  be regarded as investigational or for research. This laboratory is  certified under the Clinical Laboratory Improvement Amendments of 1988  (CLIA-88) as qualified to  perform high complexity clinical laboratory  testing. The controls stained appropriately.   ADDENDUM:   Mismatch Repair Protein (IHC)   SUMMARY INTERPRETATION: ABNORMAL   There is loss of the major and minor MMR proteins MLH1 and PMS2. The  loss of expression may be secondary to promoter hyper-methylation, gene  mutation or other genetic event. BRAF mutation testing and/or MLH1  methylation testing is indicated. The presence of a BRAF mutation and/or  MLH1 hypermethylation is indicative of a sporadic-type tumor. The  absence of either BRAF mutation and/or presence of normal methylation  indicate the possible presence of a hereditary germline mutation (e.g.  Lynch syndrome) and referral to genetic counseling is warranted. It is  recommended that the loss of protein expression be correlated with  molecular based MSI testing.   IHC EXPRESSION RESULTS   TEST      RESULT  MLH1:     LOSS OF NUCLEAR EXPRESSION  MSH2:  Preserved nuclear expression  MSH6:     Preserved nuclear expression  PMS2:     LOSS OF NUCLEAR EXPRESSION   References:  1. Guidelines on Genetic Evaluation and Management of Lynch Syndrome: A  Consensus Statement by the [place] Multi-Society Task Force on Colorectal  Cancer Gae Dry. Sherlie Ban , MD, and other . Am Nicki [place] 2014;  [phone]; doi: 10.1038/ajg.2014.186; published online 20 March 2013  2. Outcomes of screening endometrial cancer patients for Lynch syndrome  by patient-administered checklist. Julie Heckle MS, and others. Gynecol Oncol  2013;131(3):619-623.  3. Muir-Torre syndrome (MTS): An update and approach to diagnosis and  management. Shelly Flatten, MD and others. J Am Acad Dermatol  [phone].                    07/18/2020       Component 2 mo ago  SURGICAL PATHOLOGY SURGICAL PATHOLOGY  CASE: [id number]  PATIENT: Julie Orozco  Surgical Pathology Report      Clinical History:  Post menopausal bleeding, endometrial polyp;  Endometrial cancer? ([facility])      FINAL MICROSCOPIC DIAGNOSIS:   A. ENDOMETRIAL CURETTINGS, POLYP AND FIBROID, CURETTAGE:  - Endometrioid adenocarcinoma with squamous differentiation, see  comment.   COMMENT:   As sample the tumor appears FIGO grade 1. There are background fragments  of polyp. Dr. Jeannie Done has reviewed the case. Dr. Dellis Filbert was notified on  07/22/2020.    GROSS DESCRIPTION:   Received in formalin are fragments of soft tan-pink tissue and clotted  blood measuring 5.0 x 5.0 x 2.0 cm in aggregate. The specimen is  entirely submitted in 15 cassettes. [facility] 07/18/2020)    Final Diagnosis performed by Vicente Males, MD.  Electronically signed  07/22/2020  Technical and / or Professional components performed at [facility], [place] [address], [place], [place] [address].  Immunohistochemistry Technical component (if applicable) was performed  at [facility]. [address] [place],  [place], [place] [address].  IMMUNOHISTOCHEMISTRY DISCLAIMER (if applicable):  Some of these immunohistochemical stains may have been developed and the  performance characteristics determine by [facility]. Some  may not have been cleared or approved by the U.S. Food and Drug  Administration. The FDA has determined that such clearance or approval  is not necessary. This test is used for clinical purposes. It should not  be regarded as investigational or for research. This laboratory is  certified under the [place]  (CLIA-88) as qualified to perform high complexity clinical laboratory  testing. The controls stained appropriately.              Past/Anticipated interventions by Gyn/Onc surgery, if any:  08/27/2020  Dr. Jeral Pinch XI ROBOTIC ASSISTED TOTAL HYSTERECTOMY WITH BILATERAL SALPINGO OOPHORECTOMY, N/A General  SENTINEL LYMPH  NODE BIOPSY       07/18/2020 Dr. Princess Bruins DILATATION & CURETTAGE/HYSTEROSCOPY WITH MYOSURE   Past/Anticipated interventions by medical oncology, if any:   Weight changes, if QIO:NGEXBM that she was 172 and now weights 168.8  Bowel/Bladder complaints, if any:  Dysuria  None Hematuria None Urgency with urination None Emptying bladder States that she does not empty her bladder with urination Constipation or diarrhea Some constipation ,states that she will try a laxative. Vaginal or rectal bleeding no Vaginal discharge none   Nausea/Vomiting, if any: None  Pain issues, if any:  None  SAFETY ISSUES:  Prior radiation? No  Pacemaker/ICD?  No  Possible current pregnancy?  No  Is the patient on methotrexate?  No  Current Complaints / other details:   Marland Kitchen Vitals:   09/17/20 1518  BP: (!) 142/73  Pulse: 70  Resp: 18  Temp: 97.8 F (36.6 C)  SpO2: 99%  Weight: 76.6 kg  Height: 5' (1.524 m)   Patient is using her daughter Julie Orozco

## 2020-09-12 ENCOUNTER — Encounter: Payer: Medicare Other | Admitting: Gynecologic Oncology

## 2020-09-15 ENCOUNTER — Encounter: Payer: Self-pay | Admitting: Gynecologic Oncology

## 2020-09-16 ENCOUNTER — Telehealth: Payer: Self-pay | Admitting: Gynecologic Oncology

## 2020-09-16 ENCOUNTER — Other Ambulatory Visit: Payer: Self-pay

## 2020-09-16 ENCOUNTER — Encounter (HOSPITAL_COMMUNITY): Payer: Self-pay | Admitting: Gynecologic Oncology

## 2020-09-16 ENCOUNTER — Encounter: Payer: Self-pay | Admitting: Gynecologic Oncology

## 2020-09-16 ENCOUNTER — Telehealth: Payer: Self-pay | Admitting: *Deleted

## 2020-09-16 ENCOUNTER — Inpatient Hospital Stay (HOSPITAL_BASED_OUTPATIENT_CLINIC_OR_DEPARTMENT_OTHER): Payer: Medicare Other | Admitting: Gynecologic Oncology

## 2020-09-16 ENCOUNTER — Ambulatory Visit (HOSPITAL_COMMUNITY)
Admission: RE | Admit: 2020-09-16 | Discharge: 2020-09-16 | Disposition: A | Discharge status: Home / Self Care | Payer: Medicare Other | Source: Ambulatory Visit | Attending: Gynecologic Oncology | Admitting: Gynecologic Oncology

## 2020-09-16 VITALS — BP 141/56 | HR 64 | Temp 96.9°F | Resp 18 | Wt 170.2 lb

## 2020-09-16 DIAGNOSIS — D7389 Other diseases of spleen: Secondary | ICD-10-CM | POA: Diagnosis not present

## 2020-09-16 DIAGNOSIS — C541 Malignant neoplasm of endometrium: Secondary | ICD-10-CM | POA: Diagnosis not present

## 2020-09-16 DIAGNOSIS — Z90710 Acquired absence of both cervix and uterus: Secondary | ICD-10-CM

## 2020-09-16 DIAGNOSIS — B3731 Acute candidiasis of vulva and vagina: Secondary | ICD-10-CM

## 2020-09-16 DIAGNOSIS — I700 Atherosclerosis of aorta: Secondary | ICD-10-CM | POA: Diagnosis not present

## 2020-09-16 DIAGNOSIS — Z90722 Acquired absence of ovaries, bilateral: Secondary | ICD-10-CM

## 2020-09-16 DIAGNOSIS — D71 Functional disorders of polymorphonuclear neutrophils: Secondary | ICD-10-CM | POA: Diagnosis not present

## 2020-09-16 DIAGNOSIS — B373 Candidiasis of vulva and vagina: Secondary | ICD-10-CM

## 2020-09-16 HISTORY — DX: Acute candidiasis of vulva and vagina: B37.31

## 2020-09-16 MED ORDER — IOHEXOL 300 MG/ML  SOLN
100.0000 mL | Freq: Once | INTRAMUSCULAR | Status: AC | PRN
  Administered 2020-09-16: 100 mL via INTRAVENOUS

## 2020-09-16 MED ORDER — FLUCONAZOLE 150 MG PO TABS: 150.0000 mg | ORAL_TABLET | Freq: Every day | ORAL | 0 refills | Status: DC

## 2020-09-16 NOTE — Telephone Encounter (Signed)
Called the patient's daughter, Lavone Neri. Reviewed findings from CT report as well as MSI testing. Questions answered.  Jeral Pinch MD Gynecologic Oncology

## 2020-09-16 NOTE — Progress Notes (Signed)
Gynecologic Oncology Return Clinic Visit  09/16/20  Reason for Visit: post-op follow-up, treatment discussion  Treatment History: Oncology History Overview Note  IHC - loss of MLH1 and PMS2   Endometrial adenocarcinoma ([place])  07/18/2020 Surgery   D&C: grade 1 EMCA    Initial Diagnosis   Endometrial adenocarcinoma ([place])   08/27/2020 Surgery   Robotic-assisted laparoscopic total hysterectomy with bilateral salpingoophorectomy, SLN biopsy    08/27/2020 Pathology Results   A. SENTINEL LYMPH NODE, RIGHT OBTURATOR, BIOPSY:  - Lymph node, negative for carcinoma (0/1)   B. SENTINEL LYMPH NODE, RIGHT EXTERNAL ILIAC, BIOPSY:  - Lymph node, negative for carcinoma (0/1)   C. 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 UTERUS, CERVIX, BILATERAL TUBES AND OVARIES:  - Endometrioid adenocarcinoma, FIGO grade 2, see comment  - Carcinoma invades for a depth of 0.6 cm with a myometrial thickness is  2.0 cm  - Benign unremarkable cervix  - Benign bilateral fallopian tubes and ovaries  - See oncology table    COMMENT:   C.   Immunostain for pancytokeratin (AE1/AE3) highlights focus of  metastatic carcinoma.   D.  Immunohistochemical stains show that the tumor cells show wild-type  (normal) expression pattern for p53 and are negative for p16, consistent  with above interpretation.  The tumor shows 2 different morphologies  with the majority tumor showing a FIGO grade 1 endometrial  adenocarcinoma and a second component with FIGO grade 2 features.  Dr.  Tresa Moore has reviewed part C and D and concurs with the diagnosis.  Dr.  Berline Lopes was notified on 09/02/2020.      ONCOLOGY TABLE:   UTERUS, CARCINOMA OR CARCINOSARCOMA: Resection   Procedure: Total hysterectomy and bilateral salpingo-oophorectomy  Histologic Type: Endometrioid adenocarcinoma  Histologic Grade: FIGO grade 2  Myometrial Invasion:       Depth of  Myometrial Invasion (mm): 6 mm       Myometrial Thickness (mm): 20 mm       Percentage of Myometrial Invasion: Less than 50%  Uterine Serosa Involvement: Not identified  Cervical stromal Involvement: Not identified  Extent of involvement of other tissue/organs: Not identified  Peritoneal/Ascitic Fluid: Not applicable  Lymphovascular Invasion: Not identified  Regional Lymph Nodes:       Pelvic Lymph Nodes Examined:  3 Sentinel             0 Non-sentinel             3 Total       Pelvic Lymph Nodes with Metastasis: 1             Macrometastasis: (>2.0 mm): 0             Micrometastasis: (>0.2 mm and < 2.0 mm): 1             Isolated Tumor Cells (<0.2 mm): 0             Laterality of Lymph Node with Tumor: Left             Extracapsular Extension: Not identified       Para-aortic Lymph Nodes Examined:              0 Sentinel              0 Non-sentinel              0 Total  Distant Metastasis:       Distant  Site(s) Involved: Not applicable  Pathologic Stage Classification (pTNM, AJCC 8th Edition): pT1a, pN36mi  Ancillary Studies: MMR / MSI testing will be ordered  Representative Tumor Block: D5  Comment(s): Pancytokeratin was performed on the lymph nodes.    08/27/2020 Cancer Staging   Staging form: Corpus Uteri - Carcinoma and Carcinosarcoma, AJCC 8th Edition - Clinical stage from 08/27/2020: FIGO Stage IIIC1 (cT1a, cN79mi, cM0) - Signed by Lafonda Mosses, MD on 09/03/2020     Interval History: Doing well. Has occasional twinges at her incisions, otherwise significant abdominal or pelvic pain. Denies fevers, chills, nausea or emesis. Tolerating regular diet. Reports having regular bowel function although smaller than normal (still describes as soft) and doesn't feel like she completely empties. Denies urinary symptoms. Denies vaginal bleeding or discharge. Endorses vulvar pruritus.   Past Medical/Surgical History: Past Medical History:  Diagnosis Date  . DM type 2 (diabetes  mellitus, type 2) ([place])   . Elevated ALT measurement 04/18/2018   resolved  . Endometrial cancer ([place])   . Hyperlipidemia   . Hypertension   . Obesity   . Osteopenia determined by x-ray   . PMB (postmenopausal bleeding)   . SVD (spontaneous vaginal delivery)    x 8    Past Surgical History:  Procedure Laterality Date  . DILATATION & CURETTAGE/HYSTEROSCOPY WITH MYOSURE N/A 07/18/2020   Procedure: DILATATION & CURETTAGE/HYSTEROSCOPY WITH MYOSURE;  Surgeon: Princess Bruins, MD;  Location: [facility];  Service: Gynecology;  Laterality: N/A;  . DILATION AND CURETTAGE OF UTERUS N/A 01/25/2014   Procedure: DILATATION AND CURETTAGE;  Surgeon: Emily Filbert, MD;  Location: [facility];  Service: Gynecology;  Laterality: N/A;  . MULTIPLE TOOTH EXTRACTIONS  yrs ago  . ROBOTIC ASSISTED TOTAL HYSTERECTOMY WITH BILATERAL SALPINGO OOPHERECTOMY N/A 08/27/2020   Procedure: XI ROBOTIC ASSISTED TOTAL HYSTERECTOMY WITH BILATERAL SALPINGO OOPHORECTOMY,;  Surgeon: Lafonda Mosses, MD;  Location: [facility];  Service: Gynecology;  Laterality: N/A;  . SENTINEL NODE BIOPSY N/A 08/27/2020   Procedure: SENTINEL LYMPH NODE BIOPSY;  Surgeon: Lafonda Mosses, MD;  Location: [facility];  Service: Gynecology;  Laterality: N/A;  . TUBAL LIGATION  yrs ago    Family History  Problem Relation Age of Onset  . Hypertension Mother   . Alcohol abuse Father   . Anemia Brother   . Alcohol abuse Brother   . Breast cancer Cousin        maternal cousin  . Colon cancer Neg Hx   . Esophageal cancer Neg Hx   . Rectal cancer Neg Hx   . Stomach cancer Neg Hx   . Ovarian cancer Neg Hx     Social History   Socioeconomic History  . Marital status: Divorced    Spouse name: Not on file  . Number of children: Not on file  . Years of education: Not on file  . Highest education level: Not on file  Occupational History  . Not on file  Tobacco Use  . Smoking status: Never Smoker  . Smokeless tobacco: Never Used   Vaping Use  . Vaping Use: Never used  Substance and Sexual Activity  . Alcohol use: No  . Drug use: No  . Sexual activity: Not Currently    Birth control/protection: Post-menopausal  Other Topics Concern  . Not on file  Social History Narrative  . Not on file   Social Determinants of Health   Financial Resource Strain: Not on file  Food Insecurity: Not on file  Transportation Needs:  Not on file  Physical Activity: Not on file  Stress: Not on file  Social Connections: Not on file    Current Medications:  Current Outpatient Medications:  .  fluconazole (DIFLUCAN) 150 MG tablet, Take 1 tablet (150 mg total) by mouth daily., Disp: 1 tablet, Rfl: 0 .  acetaminophen (TYLENOL) 500 MG tablet, Take 500-1,000 mg by mouth every 6 (six) hours as needed (pain.)., Disp: , Rfl:  .  Cholecalciferol (VITAMIN D3) 25 MCG (1000 UT) CAPS, Take 1,000 Units by mouth daily., Disp: , Rfl:  .  ibuprofen (ADVIL) 200 MG tablet, Take 600 mg by mouth every 8 (eight) hours as needed (pain.)., Disp: , Rfl:  .  ibuprofen (ADVIL) 600 MG tablet, Take 1 tablet (600 mg total) by mouth every 6 (six) hours as needed for moderate pain. For AFTER surgery only, Disp: 30 tablet, Rfl: 0 .  lisinopril (ZESTRIL) 10 MG tablet, Take 1 tablet by mouth once daily, Disp: 90 tablet, Rfl: 0 .  metFORMIN (GLUCOPHAGE) 500 MG tablet, Take 1 tablet (500 mg total) by mouth daily with breakfast., Disp: 90 tablet, Rfl: 1 .  senna-docusate (SENOKOT-S) 8.6-50 MG tablet, Take 2 tablets by mouth at bedtime. For AFTER surgery, do not take if having diarrhea, Disp: 30 tablet, Rfl: 0 .  simvastatin (ZOCOR) 20 MG tablet, Take 1 tablet (20 mg total) by mouth at bedtime., Disp: 90 tablet, Rfl: 1 .  traMADol (ULTRAM) 50 MG tablet, Take 1 tablet (50 mg total) by mouth every 6 (six) hours as needed for moderate pain. For AFTER surgery only, do not take and drive, Disp: 10 tablet, Rfl: 0  Review of Systems: Denies appetite changes, fevers, chills,  fatigue, unexplained weight changes. Denies hearing loss, neck lumps or masses, mouth sores, ringing in ears or voice changes. Denies cough or wheezing.  Denies shortness of breath. Denies chest pain or palpitations. Denies leg swelling. Denies abdominal distention, pain, blood in stools, diarrhea, nausea, vomiting, or early satiety. Denies pain with intercourse, dysuria, frequency, hematuria or incontinence. Denies hot flashes, pelvic pain, vaginal bleeding or vaginal discharge.   Denies joint pain, back pain or muscle pain/cramps. Denies rash, or wounds. Denies dizziness, headaches, numbness or seizures. Denies swollen lymph nodes or glands, denies easy bruising or bleeding. Denies anxiety, depression, confusion, or decreased concentration.  Physical Exam: BP (!) 141/56 (BP Location: Right Arm, Patient Position: Sitting)   Pulse 64   Temp (!) 96.9 F (36.1 C) (Tympanic)   Resp 18   Wt 170 lb 3.2 oz (77.2 kg)   SpO2 100%   BMI 33.24 kg/m  General: Alert, oriented, no acute distress. HEENT: Normocephalic, atraumatic, sclera anicteric. Chest: Unlabored breathing on room air. Abdomen: Obese, soft, nontender.  Normoactive bowel sounds.  No masses or hepatosplenomegaly appreciated.  Well-healing laparoscopic incisions.  Extremities: Grossly normal range of motion.  Warm, well perfused.  No edema bilaterally. GU: Normal appearing external genitalia without erythema, excoriation, or lesions.  Speculum exam reveals cuff intact, no sutures visible, no bleeding or discharge.  Bimanual exam reveals cuff intact, no tenderness or fluctuance.    Laboratory & Radiologic Studies: None new  CT A/P from today is pending  Assessment & Plan: Julie Orozco is a 75 y.o. woman with Stage IIIC endometrioid adenocarcinoma, grade 2 who presents for follow-up after surgery.  The patient is doing very well post-operatively and meeting milestones. Discussed continued expectations and limitations.  Significant stool burden is noted on CT - we discussed using bowel regimen to  help with postoperative constipation. Also discussed vulvar pruritus symptoms. I don't see obvious rash or lesion, but given recent surgery and antibiotics, I recommend treating empirically for yeast. I sent a one time dose of Diflucan to the patient's pharmacy.   We discussed again her pathology results. CT performed this morning - final report pending. On my review, she has some prominent pelvic LNs, post-surgical changes in the pelvis. Given pathology, we'd discussed adjuvant treatment (chemotherapy vs. RT) and recommended pelvic radiation. She is set up to see Dr. Sondra Come tomorrow. Will assure no significant disease burden (especially extra-lymphatic) prior to making definitive RT plan. IHC showed loss of MLH1 and PMS2. MSI testing still pending.  22 minutes of total time was spent for this patient encounter, including preparation, face-to-face counseling with the patient and coordination of care, and documentation of the encounter.  Jeral Pinch, MD  Division of Gynecologic Oncology  Department of Obstetrics and Gynecology  [facility]

## 2020-09-16 NOTE — Patient Instructions (Signed)
You are healing very well from surgery. I will see you after you finish radiation treatments. Then, we will alternate visits between my office and Dr. Sondra Come, the radiation oncologist.  I sent a prescription in for a one time dose of a yeast medication to help with your vulvar itching.

## 2020-09-17 ENCOUNTER — Ambulatory Visit
Admission: RE | Admit: 2020-09-17 | Discharge: 2020-09-17 | Disposition: A | Discharge status: Home / Self Care | Payer: Medicare Other | Source: Ambulatory Visit | Attending: Radiation Oncology | Admitting: Radiation Oncology

## 2020-09-17 ENCOUNTER — Other Ambulatory Visit: Payer: Self-pay

## 2020-09-17 ENCOUNTER — Encounter: Payer: Self-pay | Admitting: Radiation Oncology

## 2020-09-17 VITALS — BP 142/73 | HR 70 | Temp 97.8°F | Resp 18 | Ht 60.0 in | Wt 168.8 lb

## 2020-09-17 DIAGNOSIS — E669 Obesity, unspecified: Secondary | ICD-10-CM | POA: Diagnosis not present

## 2020-09-17 DIAGNOSIS — Z79899 Other long term (current) drug therapy: Secondary | ICD-10-CM | POA: Diagnosis not present

## 2020-09-17 DIAGNOSIS — Z90710 Acquired absence of both cervix and uterus: Secondary | ICD-10-CM | POA: Insufficient documentation

## 2020-09-17 DIAGNOSIS — Z7984 Long term (current) use of oral hypoglycemic drugs: Secondary | ICD-10-CM | POA: Insufficient documentation

## 2020-09-17 DIAGNOSIS — Z808 Family history of malignant neoplasm of other organs or systems: Secondary | ICD-10-CM | POA: Diagnosis not present

## 2020-09-17 DIAGNOSIS — Z803 Family history of malignant neoplasm of breast: Secondary | ICD-10-CM | POA: Insufficient documentation

## 2020-09-17 DIAGNOSIS — C541 Malignant neoplasm of endometrium: Secondary | ICD-10-CM

## 2020-09-17 DIAGNOSIS — C779 Secondary and unspecified malignant neoplasm of lymph node, unspecified: Secondary | ICD-10-CM | POA: Insufficient documentation

## 2020-09-17 DIAGNOSIS — M8580 Other specified disorders of bone density and structure, unspecified site: Secondary | ICD-10-CM | POA: Insufficient documentation

## 2020-09-17 DIAGNOSIS — I700 Atherosclerosis of aorta: Secondary | ICD-10-CM | POA: Diagnosis not present

## 2020-09-17 DIAGNOSIS — E119 Type 2 diabetes mellitus without complications: Secondary | ICD-10-CM | POA: Insufficient documentation

## 2020-09-17 DIAGNOSIS — E785 Hyperlipidemia, unspecified: Secondary | ICD-10-CM | POA: Insufficient documentation

## 2020-09-17 DIAGNOSIS — I10 Essential (primary) hypertension: Secondary | ICD-10-CM | POA: Insufficient documentation

## 2020-09-17 DIAGNOSIS — Z90722 Acquired absence of ovaries, bilateral: Secondary | ICD-10-CM | POA: Diagnosis not present

## 2020-09-17 NOTE — Progress Notes (Signed)
Radiation Oncology         [phone] ________________________________  Initial Outpatient Consultation  Name: Julie Orozco MRN: [id number]  Date: 09/17/2020  DOB: 1946-03-19  [id number], Laurian Brim, NP-C  Lafonda Mosses, MD   REFERRING PHYSICIAN: Lafonda Mosses, MD  DIAGNOSIS: The encounter diagnosis was Endometrial adenocarcinoma [facility]).  Stage III endometrioid adenocarcinoma (pT1a, pN39mi, M0)  HISTORY OF PRESENT ILLNESS::Julie Orozco is a 75 y.o. female who is seen as a courtesy of Dr. Berline Lopes for an opinion concerning radiation therapy as part of management for her recently diagnosed endometrial cancer. Today, she is accompanied by her daughter who serves as the interpreter. The patient presented to Dr. Dellis Filbert with complaint of postmenopausal bleeding. She underwent a D&C on 07/18/2020, which revealed endometrioid adenocarcinoma with squamous differentiation.  Given the above findings, the patient was referred to Dr. Berline Lopes and underwent a robotic-assisted laparoscopic total hysterectomy with bilateral salpingo-oophorectomy and sentinel lymph node biopsy on 08/27/2020. Pathology from the procedure revealed FIGO grade 2 endometrioid adenocarcinoma that invaded a depth of 0.6 cm with a myometrial thickness of 2.0 cm. Cervix, bilateral fallopian tubes, and bilateral ovaries were benign. Metastatic carcinoma was found in one left obturator lymph node (N34mi). Right external and right obturator lymph nodes were negative for carcinoma.  The patient's case was discussed at the Gynecologic Oncology Multi-Disciplinary Conference on 09/08/2020, during which time it was recommended that she proceed with external beam radiation therapy to the pelvis with consideration for vaginal brachytherapy boost.    CT scan of abdomen and pelvis on 09/16/2020 showed a small low-attenuation collection adjacent to the right-side of the vaginal cuff that likely represented a tiny post-operative  seroma. There were borderline enlarged and minimally enlarged pelvic lymph nodes bilaterally (left greater than right), which were non-specific in the setting of recent surgery.  PREVIOUS RADIATION THERAPY: No  PAST MEDICAL HISTORY:  Past Medical History:  Diagnosis Date  . DM type 2 (diabetes mellitus, type 2) ([place])   . Elevated ALT measurement 04/18/2018   resolved  . Endometrial cancer ([place])   . Hyperlipidemia   . Hypertension   . Obesity   . Osteopenia determined by x-ray   . PMB (postmenopausal bleeding)   . SVD (spontaneous vaginal delivery)    x 8    PAST SURGICAL HISTORY: Past Surgical History:  Procedure Laterality Date  . DILATATION & CURETTAGE/HYSTEROSCOPY WITH MYOSURE N/A 07/18/2020   Procedure: DILATATION & CURETTAGE/HYSTEROSCOPY WITH MYOSURE;  Surgeon: Princess Bruins, MD;  Location: [facility];  Service: Gynecology;  Laterality: N/A;  . DILATION AND CURETTAGE OF UTERUS N/A 01/25/2014   Procedure: DILATATION AND CURETTAGE;  Surgeon: Emily Filbert, MD;  Location: [facility];  Service: Gynecology;  Laterality: N/A;  . MULTIPLE TOOTH EXTRACTIONS  yrs ago  . ROBOTIC ASSISTED TOTAL HYSTERECTOMY WITH BILATERAL SALPINGO OOPHERECTOMY N/A 08/27/2020   Procedure: XI ROBOTIC ASSISTED TOTAL HYSTERECTOMY WITH BILATERAL SALPINGO OOPHORECTOMY,;  Surgeon: Lafonda Mosses, MD;  Location: [facility];  Service: Gynecology;  Laterality: N/A;  . SENTINEL NODE BIOPSY N/A 08/27/2020   Procedure: SENTINEL LYMPH NODE BIOPSY;  Surgeon: Lafonda Mosses, MD;  Location: [facility];  Service: Gynecology;  Laterality: N/A;  . TUBAL LIGATION  yrs ago    FAMILY HISTORY:  Family History  Problem Relation Age of Onset  . Hypertension Mother   . Alcohol abuse Father   . Anemia Brother   . Alcohol abuse Brother   . Breast cancer Cousin  maternal cousin  . Colon cancer Neg Hx   . Esophageal cancer Neg Hx   . Rectal cancer Neg Hx   . Stomach cancer Neg Hx   . Ovarian cancer  Neg Hx     SOCIAL HISTORY:  Social History   Tobacco Use  . Smoking status: Never Smoker  . Smokeless tobacco: Never Used  Vaping Use  . Vaping Use: Never used  Substance Use Topics  . Alcohol use: No  . Drug use: No    ALLERGIES: No Known Allergies  MEDICATIONS:  Current Outpatient Medications  Medication Sig Dispense Refill  . acetaminophen (TYLENOL) 500 MG tablet Take 500-1,000 mg by mouth every 6 (six) hours as needed (pain.).    Marland Kitchen Cholecalciferol (VITAMIN D3) 25 MCG (1000 UT) CAPS Take 1,000 Units by mouth daily.    Marland Kitchen ibuprofen (ADVIL) 200 MG tablet Take 600 mg by mouth every 8 (eight) hours as needed (pain.).    Marland Kitchen lisinopril (ZESTRIL) 10 MG tablet Take 1 tablet by mouth once daily 90 tablet 0  . metFORMIN (GLUCOPHAGE) 500 MG tablet Take 1 tablet (500 mg total) by mouth daily with breakfast. 90 tablet 1  . simvastatin (ZOCOR) 20 MG tablet Take 1 tablet (20 mg total) by mouth at bedtime. 90 tablet 1  . fluconazole (DIFLUCAN) 150 MG tablet Take 1 tablet (150 mg total) by mouth daily. (Patient not taking: Reported on 09/17/2020) 1 tablet 0  . ibuprofen (ADVIL) 600 MG tablet Take 1 tablet (600 mg total) by mouth every 6 (six) hours as needed for moderate pain. For AFTER surgery only (Patient not taking: Reported on 09/17/2020) 30 tablet 0  . senna-docusate (SENOKOT-S) 8.6-50 MG tablet Take 2 tablets by mouth at bedtime. For AFTER surgery, do not take if having diarrhea (Patient not taking: Reported on 09/17/2020) 30 tablet 0  . traMADol (ULTRAM) 50 MG tablet Take 1 tablet (50 mg total) by mouth every 6 (six) hours as needed for moderate pain. For AFTER surgery only, do not take and drive (Patient not taking: Reported on 09/17/2020) 10 tablet 0   No current facility-administered medications for this encounter.    REVIEW OF SYSTEMS:  A 10+ POINT REVIEW OF SYSTEMS WAS OBTAINED including neurology, dermatology, psychiatry, cardiac, respiratory, lymph, extremities, GI, GU, musculoskeletal,  constitutional, reproductive, HEENT.  She denies any pain within the pelvis area or problems with abdominal bloating since surgery.  She denies any vaginal bleeding or discharge.  Her appetite has been good since surgery.  She denies any problems with swelling in her lower extremities   PHYSICAL EXAM:  height is 5' (1.524 m) and weight is 168 lb 12.8 oz (76.6 kg). Her temperature is 97.8 F (36.6 C). Her blood pressure is 142/73 (abnormal) and her pulse is 70. Her respiration is 18 and oxygen saturation is 99%.   General: Alert and oriented, in no acute distress HEENT: Head is normocephalic. Extraocular movements are intact.  Neck: Neck is supple, no palpable cervical or supraclavicular lymphadenopathy. Heart: Regular in rate and rhythm with no murmurs, rubs, or gallops. Chest: Clear to auscultation bilaterally, with no rhonchi, wheezes, or rales. Abdomen: Soft, nontender, nondistended, with no rigidity or guarding.  Laparoscopy scars healing well without signs of drainage or infection Extremities: No cyanosis or edema. Lymphatics: see Neck Exam Skin: No concerning lesions. Musculoskeletal: symmetric strength and muscle tone throughout. Neurologic: Cranial nerves II through XII are grossly intact. No obvious focalities. Speech is fluent. Coordination is intact. Psychiatric: Judgment and insight are  intact. Affect is appropriate. Pelvic exam deferred until simulation and planning day  ECOG = 1  0 - Asymptomatic (Fully active, able to carry on all predisease activities without restriction)  1 - Symptomatic but completely ambulatory (Restricted in physically strenuous activity but ambulatory and able to carry out work of a light or sedentary nature. For example, light housework, office work)  2 - Symptomatic, <50% in bed during the day (Ambulatory and capable of all self care but unable to carry out any work activities. Up and about more than 50% of waking hours)  3 - Symptomatic, >50% in bed,  but not bedbound (Capable of only limited self-care, confined to bed or chair 50% or more of waking hours)  4 - Bedbound (Completely disabled. Cannot carry on any self-care. Totally confined to bed or chair)  5 - Death   Eustace Pen MM, Creech RH, Tormey DC, et al. [phone]). "Toxicity and response criteria of the [facility]". [place] Oncol. 5 (6): 649-55  LABORATORY DATA:  Lab Results  Component Value Date   WBC 10.9 (H) 08/18/2020   HGB 13.3 08/18/2020   HCT 41.1 08/18/2020   MCV 94.5 08/18/2020   PLT 290 08/18/2020   NEUTROABS 5.1 06/05/2020   Lab Results  Component Value Date   NA 139 08/18/2020   K 4.3 08/18/2020   CL 105 08/18/2020   CO2 24 08/18/2020   GLUCOSE 74 08/18/2020   CREATININE 0.80 08/18/2020   CALCIUM 9.4 08/18/2020      RADIOGRAPHY: CT Abdomen Pelvis W Contrast  Result Date: 09/16/2020 CLINICAL DATA:  75 year old female with history of endometrial cancer. Status post TAH/BSO with radiation therapy planned. Staging examination. EXAM: CT ABDOMEN AND PELVIS WITH CONTRAST TECHNIQUE: Multidetector CT imaging of the abdomen and pelvis was performed using the standard protocol following bolus administration of intravenous contrast. CONTRAST:  151mL OMNIPAQUE IOHEXOL 300 MG/ML  SOLN COMPARISON:  No prior. FINDINGS: Lower chest: Aortic atherosclerosis. Hepatobiliary: No suspicious cystic or solid hepatic lesions. No intra or extrahepatic biliary ductal dilatation. Gallbladder is normal in appearance. Pancreas: No pancreatic mass. No pancreatic ductal dilatation. No pancreatic or peripancreatic fluid collections or inflammatory changes. Spleen: Small calcified granulomas in the spleen. Otherwise, unremarkable. Adrenals/Urinary TracT: Bilateral kidneys and adrenal glands are normal in appearance. No hydroureteronephrosis. Urinary bladder is nearly decompressed, but otherwise unremarkable in appearance. Stomach/Bowel: The appearance of the stomach is normal.  Large periampullary duodenal diverticulum incidentally noted, without surrounding inflammatory changes to suggest an acute diverticulitis at this time. No pathologic dilatation of small bowel or colon. Normal appendix. Vascular/Lymphatic: Aortic atherosclerosis, without evidence of aneurysm or dissection in the abdominal or pelvic vasculature. Several prominent borderline enlarged and minimally enlarged pelvic lymph nodes are noted, measuring up to 1.2 cm in short axis in the left obturator nodal station. Reproductive: Status post total abdominal hysterectomy and bilateral salpingo-oophorectomy. Small amount of low-attenuation adjacent to the right-side of the vaginal cuff best appreciated on axial image 67 of series 2 and sagittal image 54 of series 5) measuring 1.7 x 1.2 x 1.4 cm, likely to represent a tiny postoperative seroma. Other: No significant volume of ascites.  No pneumoperitoneum. Musculoskeletal: There are no aggressive appearing lytic or blastic lesions noted in the visualized portions of the skeleton. IMPRESSION: 1. Status post TAH/BSO with small low-attenuation collection adjacent to the right-side of the vaginal cuff, likely to represent a tiny postoperative seroma. There are borderline enlarged and minimally enlarged pelvic lymph nodes bilaterally (left greater than  right), which is nonspecific in the setting of recent surgery. Close attention on follow-up imaging is recommended to ensure the stability or resolution of these findings, as the possibility of nodal metastasis is not entirely excluded (but not strongly favored at this time). 2. Aortic atherosclerosis. Electronically Signed   By: Vinnie Langton M.D.   On: 09/16/2020 10:02      IMPRESSION: Stage III endometrioid adenocarcinoma   Patient will be a good candidate for postoperative external beam pelvic radiation therapy.  As above she was found to have lymph node metastasis in one of the lymph nodes submitted but certainly not a  large volume of metastasis noted in lymph nodes.  The  patient's case was presented at the multidisciplinary gynecologic oncology conference and given the low volume of metastatic disease in the pelvis area it was felt that the patient would benefit from pelvic radiation therapy rather than systemic full dose chemotherapy without the associated side effects of full dose chemotherapy.  I would also recommend a limited vaginal cuff boost as part of her overall treatment in addition.  I discussed the overall course of treatment side effects and potential toxicities of external beam radiation therapy and vaginal brachytherapy with the patient with the assistance of her daughter as an interpreter.  The patient appears to understand and wishes to proceed with planned course of treatment.   The patient was encouraged to ask questions that I answered to the best of my ability.  A patient consent form was discussed and signed.  We retained a copy for our records.  The patient would like to proceed with radiation and will be scheduled for CT simulation.  PLAN: Patient will be scheduled for CT simulation approximately 5 weeks out from her surgery the treatments to be in approximately 6 weeks postop to allow complete healing prior to starting radiation therapy.  She will receive 5 weeks of external beam radiation therapy.  Since the patient has had a hysterectomy with increases the amount of  small bowel in the pelvis area, I would recommend intensity modulated radiation therapy as the treatment technique for her postoperative external beam radiation therapy.  Patient will then proceed with 3 intracavitary brachytherapy treatments directed at the vaginal cuff.  Total time spent in this encounter was 65 minutes which included reviewing the patient's most recent consultations, follow-ups, D&C, hysterectomy, pathology reports, CT scan of abdomen and pelvis, physical examination, and documentation and additional time with  translation.   ------------------------------------------------  Blair Promise, PhD, MD  This document serves as a record of services personally performed by Gery Pray, MD. It was created on his behalf by Clerance Lav, a trained medical scribe. The creation of this record is based on the scribe's personal observations and the provider's statements to them. This document has been checked and approved by the attending provider.

## 2020-09-18 NOTE — Telephone Encounter (Signed)
error 

## 2020-10-06 ENCOUNTER — Ambulatory Visit
Admission: RE | Admit: 2020-10-06 | Discharge: 2020-10-06 | Disposition: A | Discharge status: Home / Self Care | Payer: Medicare Other | Source: Ambulatory Visit | Attending: Radiation Oncology | Admitting: Radiation Oncology

## 2020-10-06 ENCOUNTER — Other Ambulatory Visit: Payer: Self-pay

## 2020-10-06 DIAGNOSIS — C541 Malignant neoplasm of endometrium: Secondary | ICD-10-CM | POA: Insufficient documentation

## 2020-10-06 DIAGNOSIS — Z510 Encounter for antineoplastic radiation therapy: Secondary | ICD-10-CM | POA: Insufficient documentation

## 2020-10-07 NOTE — Progress Notes (Deleted)
  Subjective:    Patient ID: Julie Orozco, female    DOB: Jun 27, 1946, 75 y.o.   MRN: [id number]  Julie Orozco is a 75 y.o. female who presents for follow-up of Type 2 diabetes mellitus and other chronic health conditions.   HTN-   HL-   Vitamin D def-   Patient {is/are not:32546} checking home blood sugars.   Home blood sugar records: {dm home sugars:14018} How often is blood sugars being checked: *** Current symptoms include: {dm sx:14075}. Patient denies {dm sx:19199}.  Patient {is/are not:32546} checking their feet daily. Any Foot concerns (callous, ulcer, wound, thickened nails, toenail fungus, skin fungus, hammer toe): *** Last dilated eye exam: ***  Current treatments: {dm interventions:14074}. Medication compliance: {good/fair/poor:33178}  Current diet: {diet habits:16563} Current exercise: {exercise types:16438} Known diabetic complications: {diabetes complications:1215}  The following portions of the patient's history were reviewed and updated as appropriate: allergies, current medications, past medical history, past social history and problem list.  ROS as in subjective above.     Objective:    Physical Exam Alert and in no distress otherwise not examined.  There were no vitals taken for this visit.  Lab Review Diabetic Labs Latest Ref Rng & Units 08/18/2020 07/18/2020 06/05/2020 04/29/2020 12/07/2019  HbA1c 4.8 - 5.6 % 6.3(H) - - 6.3(H) 5.8(A)  Micro/Creat Ratio 0 - 29 mg/g creat - - - - 26  Chol 100 - 199 mg/dL - - 177 - 154  HDL >39 mg/dL - - 55 - 57  Calc LDL 0 - 99 mg/dL - - 100(H) - 78  Triglycerides 0 - 149 mg/dL - - 123 - 106  Creatinine 0.44 - 1.00 mg/dL 0.80 0.59 0.67 0.57 0.60   BP/Weight 09/17/2020 09/16/2020 08/27/2020 08/18/2020 23/76/2831  Systolic BP 517 616 073 710 626  Diastolic BP 73 56 54 74 59  Wt. (Lbs) 168.8 170.2 169 169 168.2  BMI 32.97 33.24 33.01 33.01 32.85   Foot/eye exam completion dates Latest Ref Rng & Units  06/05/2020 11/02/2019  Eye Exam No Retinopathy - No Retinopathy  Foot Form Completion - Done -    Steele  reports that she has never smoked. She has never used smokeless tobacco. She reports that she does not drink alcohol and does not use drugs.     Assessment & Plan:    Controlled type 2 diabetes mellitus without complication, without long-term current use of insulin ([place])  Essential hypertension  Hyperlipidemia associated with type 2 diabetes mellitus ([place])  Vitamin D deficiency  Obesity (BMI 30-39.9)  1. Rx changes: {none:33079} 2. Education: Reviewed 'ABCs' of diabetes management (respective goals in parentheses):  A1C (<7), blood pressure (<130/80), and cholesterol (LDL <100). 3. Compliance at present is estimated to be {good/fair/poor:33178}. Efforts to improve compliance (if necessary) will be directed at {compliance:16716}. 4. Follow up: {NUMBERS; 0-10:33138} {time:11}

## 2020-10-08 ENCOUNTER — Ambulatory Visit: Payer: Medicare Other | Admitting: Family Medicine

## 2020-10-08 DIAGNOSIS — E119 Type 2 diabetes mellitus without complications: Secondary | ICD-10-CM

## 2020-10-08 DIAGNOSIS — E559 Vitamin D deficiency, unspecified: Secondary | ICD-10-CM

## 2020-10-08 DIAGNOSIS — E1169 Type 2 diabetes mellitus with other specified complication: Secondary | ICD-10-CM

## 2020-10-08 DIAGNOSIS — E669 Obesity, unspecified: Secondary | ICD-10-CM

## 2020-10-08 DIAGNOSIS — I10 Essential (primary) hypertension: Secondary | ICD-10-CM

## 2020-10-09 ENCOUNTER — Encounter: Payer: Self-pay | Admitting: Family Medicine

## 2020-10-14 DIAGNOSIS — Z510 Encounter for antineoplastic radiation therapy: Secondary | ICD-10-CM | POA: Diagnosis not present

## 2020-10-14 DIAGNOSIS — C541 Malignant neoplasm of endometrium: Secondary | ICD-10-CM | POA: Diagnosis not present

## 2020-10-15 ENCOUNTER — Other Ambulatory Visit: Payer: Self-pay

## 2020-10-15 ENCOUNTER — Ambulatory Visit
Admission: RE | Admit: 2020-10-15 | Discharge: 2020-10-15 | Disposition: A | Discharge status: Home / Self Care | Payer: Medicare Other | Source: Ambulatory Visit | Attending: Radiation Oncology | Admitting: Radiation Oncology

## 2020-10-15 DIAGNOSIS — Z510 Encounter for antineoplastic radiation therapy: Secondary | ICD-10-CM | POA: Diagnosis not present

## 2020-10-15 DIAGNOSIS — C541 Malignant neoplasm of endometrium: Secondary | ICD-10-CM

## 2020-10-16 ENCOUNTER — Ambulatory Visit
Admission: RE | Admit: 2020-10-16 | Discharge: 2020-10-16 | Disposition: A | Discharge status: Home / Self Care | Payer: Medicare Other | Source: Ambulatory Visit | Attending: Radiation Oncology | Admitting: Radiation Oncology

## 2020-10-16 DIAGNOSIS — Z510 Encounter for antineoplastic radiation therapy: Secondary | ICD-10-CM | POA: Diagnosis not present

## 2020-10-16 DIAGNOSIS — C541 Malignant neoplasm of endometrium: Secondary | ICD-10-CM | POA: Diagnosis not present

## 2020-10-17 ENCOUNTER — Ambulatory Visit
Admission: RE | Admit: 2020-10-17 | Discharge: 2020-10-17 | Disposition: A | Discharge status: Home / Self Care | Payer: Medicare Other | Source: Ambulatory Visit | Attending: Radiation Oncology | Admitting: Radiation Oncology

## 2020-10-17 DIAGNOSIS — Z510 Encounter for antineoplastic radiation therapy: Secondary | ICD-10-CM | POA: Diagnosis not present

## 2020-10-17 DIAGNOSIS — C541 Malignant neoplasm of endometrium: Secondary | ICD-10-CM | POA: Diagnosis not present

## 2020-10-20 ENCOUNTER — Ambulatory Visit
Admission: RE | Admit: 2020-10-20 | Discharge: 2020-10-20 | Disposition: A | Discharge status: Home / Self Care | Payer: Medicare Other | Source: Ambulatory Visit | Attending: Radiation Oncology | Admitting: Radiation Oncology

## 2020-10-20 DIAGNOSIS — Z510 Encounter for antineoplastic radiation therapy: Secondary | ICD-10-CM | POA: Diagnosis not present

## 2020-10-20 DIAGNOSIS — C541 Malignant neoplasm of endometrium: Secondary | ICD-10-CM | POA: Diagnosis not present

## 2020-10-21 ENCOUNTER — Ambulatory Visit
Admission: RE | Admit: 2020-10-21 | Discharge: 2020-10-21 | Disposition: A | Discharge status: Home / Self Care | Payer: Medicare Other | Source: Ambulatory Visit | Attending: Radiation Oncology | Admitting: Radiation Oncology

## 2020-10-21 DIAGNOSIS — Z510 Encounter for antineoplastic radiation therapy: Secondary | ICD-10-CM | POA: Diagnosis not present

## 2020-10-21 DIAGNOSIS — C541 Malignant neoplasm of endometrium: Secondary | ICD-10-CM | POA: Diagnosis not present

## 2020-10-22 ENCOUNTER — Ambulatory Visit
Admission: RE | Admit: 2020-10-22 | Discharge: 2020-10-22 | Disposition: A | Discharge status: Home / Self Care | Payer: Medicare Other | Source: Ambulatory Visit | Attending: Radiation Oncology | Admitting: Radiation Oncology

## 2020-10-22 DIAGNOSIS — Z510 Encounter for antineoplastic radiation therapy: Secondary | ICD-10-CM | POA: Diagnosis not present

## 2020-10-22 DIAGNOSIS — C541 Malignant neoplasm of endometrium: Secondary | ICD-10-CM | POA: Diagnosis not present

## 2020-10-23 ENCOUNTER — Ambulatory Visit
Admission: RE | Admit: 2020-10-23 | Discharge: 2020-10-23 | Disposition: A | Discharge status: Home / Self Care | Payer: Medicare Other | Source: Ambulatory Visit | Attending: Radiation Oncology | Admitting: Radiation Oncology

## 2020-10-23 DIAGNOSIS — C541 Malignant neoplasm of endometrium: Secondary | ICD-10-CM | POA: Diagnosis not present

## 2020-10-23 DIAGNOSIS — Z510 Encounter for antineoplastic radiation therapy: Secondary | ICD-10-CM | POA: Diagnosis not present

## 2020-10-24 ENCOUNTER — Ambulatory Visit
Admission: RE | Admit: 2020-10-24 | Discharge: 2020-10-24 | Disposition: A | Discharge status: Home / Self Care | Payer: Medicare Other | Source: Ambulatory Visit | Attending: Radiation Oncology | Admitting: Radiation Oncology

## 2020-10-24 DIAGNOSIS — Z510 Encounter for antineoplastic radiation therapy: Secondary | ICD-10-CM | POA: Diagnosis not present

## 2020-10-24 DIAGNOSIS — C541 Malignant neoplasm of endometrium: Secondary | ICD-10-CM | POA: Diagnosis not present

## 2020-10-27 ENCOUNTER — Ambulatory Visit
Admission: RE | Admit: 2020-10-27 | Discharge: 2020-10-27 | Disposition: A | Discharge status: Home / Self Care | Payer: Medicare Other | Source: Ambulatory Visit | Attending: Radiation Oncology | Admitting: Radiation Oncology

## 2020-10-27 DIAGNOSIS — C541 Malignant neoplasm of endometrium: Secondary | ICD-10-CM | POA: Diagnosis not present

## 2020-10-27 DIAGNOSIS — Z510 Encounter for antineoplastic radiation therapy: Secondary | ICD-10-CM | POA: Diagnosis not present

## 2020-10-28 ENCOUNTER — Ambulatory Visit
Admission: RE | Admit: 2020-10-28 | Discharge: 2020-10-28 | Disposition: A | Discharge status: Home / Self Care | Payer: Medicare Other | Source: Ambulatory Visit | Attending: Radiation Oncology | Admitting: Radiation Oncology

## 2020-10-28 DIAGNOSIS — Z510 Encounter for antineoplastic radiation therapy: Secondary | ICD-10-CM | POA: Insufficient documentation

## 2020-10-28 DIAGNOSIS — C541 Malignant neoplasm of endometrium: Secondary | ICD-10-CM | POA: Diagnosis not present

## 2020-10-29 ENCOUNTER — Ambulatory Visit
Admission: RE | Admit: 2020-10-29 | Discharge: 2020-10-29 | Disposition: A | Discharge status: Home / Self Care | Payer: Medicare Other | Source: Ambulatory Visit | Attending: Radiation Oncology | Admitting: Radiation Oncology

## 2020-10-29 DIAGNOSIS — Z510 Encounter for antineoplastic radiation therapy: Secondary | ICD-10-CM | POA: Diagnosis not present

## 2020-10-29 DIAGNOSIS — C541 Malignant neoplasm of endometrium: Secondary | ICD-10-CM | POA: Diagnosis not present

## 2020-10-30 ENCOUNTER — Ambulatory Visit
Admission: RE | Admit: 2020-10-30 | Discharge: 2020-10-30 | Disposition: A | Discharge status: Home / Self Care | Payer: Medicare Other | Source: Ambulatory Visit | Attending: Radiation Oncology | Admitting: Radiation Oncology

## 2020-10-30 DIAGNOSIS — C541 Malignant neoplasm of endometrium: Secondary | ICD-10-CM | POA: Diagnosis not present

## 2020-10-30 DIAGNOSIS — Z510 Encounter for antineoplastic radiation therapy: Secondary | ICD-10-CM | POA: Diagnosis not present

## 2020-10-31 ENCOUNTER — Ambulatory Visit
Admission: RE | Admit: 2020-10-31 | Discharge: 2020-10-31 | Disposition: A | Discharge status: Home / Self Care | Payer: Medicare Other | Source: Ambulatory Visit | Attending: Radiation Oncology | Admitting: Radiation Oncology

## 2020-10-31 DIAGNOSIS — Z510 Encounter for antineoplastic radiation therapy: Secondary | ICD-10-CM | POA: Diagnosis not present

## 2020-10-31 DIAGNOSIS — C541 Malignant neoplasm of endometrium: Secondary | ICD-10-CM | POA: Diagnosis not present

## 2020-11-03 ENCOUNTER — Ambulatory Visit
Admission: RE | Admit: 2020-11-03 | Discharge: 2020-11-03 | Disposition: A | Discharge status: Home / Self Care | Payer: Medicare Other | Source: Ambulatory Visit | Attending: Radiation Oncology | Admitting: Radiation Oncology

## 2020-11-03 DIAGNOSIS — C541 Malignant neoplasm of endometrium: Secondary | ICD-10-CM | POA: Diagnosis not present

## 2020-11-03 DIAGNOSIS — Z510 Encounter for antineoplastic radiation therapy: Secondary | ICD-10-CM | POA: Diagnosis not present

## 2020-11-04 ENCOUNTER — Ambulatory Visit
Admission: RE | Admit: 2020-11-04 | Discharge: 2020-11-04 | Disposition: A | Discharge status: Home / Self Care | Payer: Medicare Other | Source: Ambulatory Visit | Attending: Radiation Oncology | Admitting: Radiation Oncology

## 2020-11-04 DIAGNOSIS — Z510 Encounter for antineoplastic radiation therapy: Secondary | ICD-10-CM | POA: Diagnosis not present

## 2020-11-04 DIAGNOSIS — C541 Malignant neoplasm of endometrium: Secondary | ICD-10-CM | POA: Diagnosis not present

## 2020-11-05 ENCOUNTER — Ambulatory Visit
Admission: RE | Admit: 2020-11-05 | Discharge: 2020-11-05 | Disposition: A | Discharge status: Home / Self Care | Payer: Medicare Other | Source: Ambulatory Visit | Attending: Radiation Oncology | Admitting: Radiation Oncology

## 2020-11-05 DIAGNOSIS — Z510 Encounter for antineoplastic radiation therapy: Secondary | ICD-10-CM | POA: Diagnosis not present

## 2020-11-05 DIAGNOSIS — C541 Malignant neoplasm of endometrium: Secondary | ICD-10-CM | POA: Diagnosis not present

## 2020-11-06 ENCOUNTER — Ambulatory Visit
Admission: RE | Admit: 2020-11-06 | Discharge: 2020-11-06 | Disposition: A | Discharge status: Home / Self Care | Payer: Medicare Other | Source: Ambulatory Visit | Attending: Radiation Oncology | Admitting: Radiation Oncology

## 2020-11-06 DIAGNOSIS — C541 Malignant neoplasm of endometrium: Secondary | ICD-10-CM | POA: Diagnosis not present

## 2020-11-06 DIAGNOSIS — Z510 Encounter for antineoplastic radiation therapy: Secondary | ICD-10-CM | POA: Diagnosis not present

## 2020-11-07 ENCOUNTER — Ambulatory Visit
Admission: RE | Admit: 2020-11-07 | Discharge: 2020-11-07 | Disposition: A | Discharge status: Home / Self Care | Payer: Medicare Other | Source: Ambulatory Visit | Attending: Radiation Oncology | Admitting: Radiation Oncology

## 2020-11-07 DIAGNOSIS — Z510 Encounter for antineoplastic radiation therapy: Secondary | ICD-10-CM | POA: Diagnosis not present

## 2020-11-07 DIAGNOSIS — C541 Malignant neoplasm of endometrium: Secondary | ICD-10-CM | POA: Diagnosis not present

## 2020-11-10 ENCOUNTER — Ambulatory Visit
Admission: RE | Admit: 2020-11-10 | Discharge: 2020-11-10 | Disposition: A | Discharge status: Home / Self Care | Payer: Medicare Other | Source: Ambulatory Visit | Attending: Radiation Oncology | Admitting: Radiation Oncology

## 2020-11-10 DIAGNOSIS — C541 Malignant neoplasm of endometrium: Secondary | ICD-10-CM | POA: Diagnosis not present

## 2020-11-10 DIAGNOSIS — Z510 Encounter for antineoplastic radiation therapy: Secondary | ICD-10-CM | POA: Diagnosis not present

## 2020-11-11 ENCOUNTER — Other Ambulatory Visit: Payer: Self-pay

## 2020-11-11 ENCOUNTER — Ambulatory Visit
Admission: RE | Admit: 2020-11-11 | Discharge: 2020-11-11 | Disposition: A | Discharge status: Home / Self Care | Payer: Medicare Other | Source: Ambulatory Visit | Attending: Radiation Oncology | Admitting: Radiation Oncology

## 2020-11-11 DIAGNOSIS — C541 Malignant neoplasm of endometrium: Secondary | ICD-10-CM | POA: Diagnosis not present

## 2020-11-11 DIAGNOSIS — Z510 Encounter for antineoplastic radiation therapy: Secondary | ICD-10-CM | POA: Diagnosis not present

## 2020-11-12 ENCOUNTER — Ambulatory Visit
Admission: RE | Admit: 2020-11-12 | Discharge: 2020-11-12 | Disposition: A | Discharge status: Home / Self Care | Payer: Medicare Other | Source: Ambulatory Visit | Attending: Radiation Oncology | Admitting: Radiation Oncology

## 2020-11-12 DIAGNOSIS — Z510 Encounter for antineoplastic radiation therapy: Secondary | ICD-10-CM | POA: Diagnosis not present

## 2020-11-12 DIAGNOSIS — C541 Malignant neoplasm of endometrium: Secondary | ICD-10-CM | POA: Diagnosis not present

## 2020-11-13 ENCOUNTER — Other Ambulatory Visit: Payer: Self-pay

## 2020-11-13 ENCOUNTER — Ambulatory Visit
Admission: RE | Admit: 2020-11-13 | Discharge: 2020-11-13 | Disposition: A | Discharge status: Home / Self Care | Payer: Medicare Other | Source: Ambulatory Visit | Attending: Radiation Oncology | Admitting: Radiation Oncology

## 2020-11-13 DIAGNOSIS — Z510 Encounter for antineoplastic radiation therapy: Secondary | ICD-10-CM | POA: Diagnosis not present

## 2020-11-13 DIAGNOSIS — C541 Malignant neoplasm of endometrium: Secondary | ICD-10-CM | POA: Diagnosis not present

## 2020-11-14 ENCOUNTER — Ambulatory Visit
Admission: RE | Admit: 2020-11-14 | Discharge: 2020-11-14 | Disposition: A | Discharge status: Home / Self Care | Payer: Medicare Other | Source: Ambulatory Visit | Attending: Radiation Oncology | Admitting: Radiation Oncology

## 2020-11-14 DIAGNOSIS — C541 Malignant neoplasm of endometrium: Secondary | ICD-10-CM | POA: Diagnosis not present

## 2020-11-14 DIAGNOSIS — Z510 Encounter for antineoplastic radiation therapy: Secondary | ICD-10-CM | POA: Diagnosis not present

## 2020-11-17 ENCOUNTER — Ambulatory Visit
Admission: RE | Admit: 2020-11-17 | Discharge: 2020-11-17 | Disposition: A | Discharge status: Home / Self Care | Payer: Medicare Other | Source: Ambulatory Visit | Attending: Radiation Oncology | Admitting: Radiation Oncology

## 2020-11-17 ENCOUNTER — Other Ambulatory Visit: Payer: Self-pay

## 2020-11-17 DIAGNOSIS — Z510 Encounter for antineoplastic radiation therapy: Secondary | ICD-10-CM | POA: Diagnosis not present

## 2020-11-17 DIAGNOSIS — C541 Malignant neoplasm of endometrium: Secondary | ICD-10-CM | POA: Diagnosis not present

## 2020-11-18 ENCOUNTER — Ambulatory Visit
Admission: RE | Admit: 2020-11-18 | Discharge: 2020-11-18 | Disposition: A | Discharge status: Home / Self Care | Payer: Medicare Other | Source: Ambulatory Visit | Attending: Radiation Oncology | Admitting: Radiation Oncology

## 2020-11-18 ENCOUNTER — Telehealth: Payer: Self-pay | Admitting: *Deleted

## 2020-11-18 ENCOUNTER — Other Ambulatory Visit: Payer: Self-pay

## 2020-11-18 DIAGNOSIS — C541 Malignant neoplasm of endometrium: Secondary | ICD-10-CM | POA: Diagnosis not present

## 2020-11-18 DIAGNOSIS — Z510 Encounter for antineoplastic radiation therapy: Secondary | ICD-10-CM | POA: Diagnosis not present

## 2020-11-18 NOTE — Telephone Encounter (Signed)
CALLED PATIENT TO INFORM OF NEW [facility]DR [facility], LVM FOR A RETURN CALL 

## 2020-11-24 ENCOUNTER — Telehealth: Payer: Self-pay | Admitting: *Deleted

## 2020-11-24 NOTE — Telephone Encounter (Signed)
CALLED PATIENT'S DAUIGHTER- Julie Orozco TO REMIND OF NEW [facility]DR [facility], SPOKE WITH HER DAUGHTER Julie Orozco AND SHE IS AWARE OF THESE APPTS.

## 2020-11-25 ENCOUNTER — Telehealth: Payer: Self-pay | Admitting: *Deleted

## 2020-11-25 ENCOUNTER — Ambulatory Visit
Admission: RE | Admit: 2020-11-25 | Discharge: 2020-11-25 | Disposition: A | Discharge status: Home / Self Care | Payer: Medicare Other | Source: Ambulatory Visit | Attending: Radiation Oncology | Admitting: Radiation Oncology

## 2020-11-25 ENCOUNTER — Encounter: Payer: Self-pay | Admitting: Radiation Oncology

## 2020-11-25 ENCOUNTER — Other Ambulatory Visit: Payer: Self-pay

## 2020-11-25 DIAGNOSIS — Z923 Personal history of irradiation: Secondary | ICD-10-CM | POA: Insufficient documentation

## 2020-11-25 DIAGNOSIS — Z79899 Other long term (current) drug therapy: Secondary | ICD-10-CM | POA: Diagnosis not present

## 2020-11-25 DIAGNOSIS — Z791 Long term (current) use of non-steroidal anti-inflammatories (NSAID): Secondary | ICD-10-CM | POA: Insufficient documentation

## 2020-11-25 DIAGNOSIS — C541 Malignant neoplasm of endometrium: Secondary | ICD-10-CM

## 2020-11-25 DIAGNOSIS — Z510 Encounter for antineoplastic radiation therapy: Secondary | ICD-10-CM | POA: Diagnosis not present

## 2020-11-25 DIAGNOSIS — Z7984 Long term (current) use of oral hypoglycemic drugs: Secondary | ICD-10-CM | POA: Diagnosis not present

## 2020-11-25 NOTE — Progress Notes (Incomplete)
  Radiation Oncology         [phone] ________________________________  Name: Julie Orozco MRN: [id number]  Date: 11/25/2020  DOB: [birth year]  CC: Girtha Rm, NP-C  Girtha Rm, NP-C  HDR BRACHYTHERAPY NOTE  DIAGNOSIS: Stage III endometrioid adenocarcinoma (pT1a, pN47mi, M0)   Simple treatment device note: Patient had construction of her custom vaginal cylinder. She will be treated with a *** cm diameter segmented cylinder. This conforms to her anatomy without undue discomfort.  Vaginal brachytherapy procedure node: The patient was brought to the [facility] suite. Identity was confirmed. All relevant records and images related to the planned course of therapy were reviewed. The patient freely provided informed written consent to proceed with treatment after reviewing the details related to the planned course of therapy. The consent form was witnessed and verified by the simulation staff. Then, the patient was set-up in a stable reproducible supine position for radiation therapy. Pelvic exam revealed the vaginal cuff to be intact ***. The patient's custom vaginal cylinder was placed in the proximal vagina. This was affixed to the CT/MR stabilization plate to prevent slippage. Patient tolerated the placement well.  Verification simulation note:  A fiducial marker was placed within the vaginal cylinder. An AP and lateral film was then obtained through the pelvis area. This documented accurate position of the vaginal cylinder for treatment.  HDR BRACHYTHERAPY TREATMENT  The remote afterloading device was affixed to the vaginal cylinder by catheter. Patient then proceeded to undergo her first high-dose-rate treatment directed at the proximal vagina. The patient was prescribed a dose of *** gray to be delivered to the mucosal surface. Treatment length was *** cm. Patient was treated with *** channel using *** dwell positions. Treatment time was *** seconds. Iridium 192 was the  high-dose-rate source for treatment. The patient tolerated the treatment well. After completion of her therapy, a radiation survey was performed documenting return of the iridium source into the [employer] safe.   PLAN: The patient will return next week for her second high-dose-rate treatment. ________________________________    Blair Promise, PhD, MD  This document serves as a record of services personally performed by Gery Pray, MD. It was created on his behalf by Clerance Lav, a trained medical scribe. The creation of this record is based on the scribe's personal observations and the provider's statements to them. This document has been checked and approved by the attending provider.

## 2020-11-25 NOTE — Telephone Encounter (Signed)
Called patient's daughter, Elodia Florence to inform mom's tx. has been moved to 11-27-20 @ 9 am, lvm for a return call

## 2020-11-25 NOTE — Progress Notes (Signed)
She is currently in no pain.  Patient complains of Loss of Sleep, Fatigue and Generalized Weakness. Reports urinary retention Pain with Urination.    Patient states they urinate 1 - 2 times per night.   Patient reports None, 2-3  bowel movement(s) per day.   Patient reports Pelvic pain, Fatigue.     Patient reports inability to empty bladder completely. Reports skin irritation at pubic area. Reports good appetite.  There were no vitals filed for this visit.

## 2020-11-25 NOTE — Progress Notes (Signed)
Radiation Oncology         [phone]) [phone] ________________________________  Name: Julie Orozco MRN: [id number]  Date: 11/25/2020  DOB: 09/08/1945  Vaginal Brachytherapy Procedure Note  CC: Girtha Rm, NP-C Lafonda Mosses, MD    ICD-10-CM   1. Endometrial adenocarcinoma ([place])  C54.1     Diagnosis: Stage III endometrioid adenocarcinoma (pT1a, pN62mi, M0)  Radiation Treatment Dates: 11/25/2020, 12/02/2020, 12/09/2020, and 12/25/2020  Narrative: She returns today for vaginal cylinder fitting. She underwent IMRT directed at the pelvis from 10/15/2020 to 11/18/2020.  On review of systems, she reports overall feeling well. She denies further problems with diarrhea.  She denies any vaginal bleeding or discharge..  ALLERGIES: has No Known Allergies.  Meds: Current Outpatient Medications  Medication Sig Dispense Refill  . acetaminophen (TYLENOL) 500 MG tablet Take 500-1,000 mg by mouth every 6 (six) hours as needed (pain.).    Marland Kitchen Cholecalciferol (VITAMIN D3) 25 MCG (1000 UT) CAPS Take 1,000 Units by mouth daily.    Marland Kitchen ibuprofen (ADVIL) 200 MG tablet Take 600 mg by mouth every 8 (eight) hours as needed (pain.).    Marland Kitchen lisinopril (ZESTRIL) 10 MG tablet Take 1 tablet by mouth once daily 90 tablet 0  . metFORMIN (GLUCOPHAGE) 500 MG tablet Take 1 tablet (500 mg total) by mouth daily with breakfast. 90 tablet 1  . simvastatin (ZOCOR) 20 MG tablet Take 1 tablet (20 mg total) by mouth at bedtime. 90 tablet 1  . fluconazole (DIFLUCAN) 150 MG tablet Take 1 tablet (150 mg total) by mouth daily. (Patient not taking: No sig reported) 1 tablet 0  . ibuprofen (ADVIL) 600 MG tablet Take 1 tablet (600 mg total) by mouth every 6 (six) hours as needed for moderate pain. For AFTER surgery only (Patient not taking: No sig reported) 30 tablet 0  . senna-docusate (SENOKOT-S) 8.6-50 MG tablet Take 2 tablets by mouth at bedtime. For AFTER surgery, do not take if having diarrhea (Patient not taking: No  sig reported) 30 tablet 0  . traMADol (ULTRAM) 50 MG tablet Take 1 tablet (50 mg total) by mouth every 6 (six) hours as needed for moderate pain. For AFTER surgery only, do not take and drive (Patient not taking: No sig reported) 10 tablet 0   No current facility-administered medications for this encounter.    Physical Findings: The patient is in no acute distress. Patient is alert and oriented.  height is 5' (1.524 m) and weight is 171 lb 6.4 oz (77.7 kg). Her temperature is 97.8 F (36.6 C). Her blood pressure is 134/57 (abnormal) and her pulse is 58 (abnormal). Her respiration is 18 and oxygen saturation is 100%.   No palpable cervical, supraclavicular or axillary lymphoadenopathy. The heart has a regular rate and rhythm. The lungs are clear to auscultation. Abdomen soft and non-tender.  On pelvic examination the external genitalia were unremarkable. A speculum exam was performed. Vaginal cuff intact, no mucosal lesions.  Some erythema to the vaginal mucosa consistent with recent external beam radiation therapy. on bimanual exam there were no pelvic masses appreciated.  Lab Findings: Lab Results  Component Value Date   WBC 10.9 (H) 08/18/2020   HGB 13.3 08/18/2020   HCT 41.1 08/18/2020   MCV 94.5 08/18/2020   PLT 290 08/18/2020    Radiographic Findings: No results found.  Impression: Stage III endometrioid adenocarcinoma (pT1a, pN57mi, M0)  Patient was fitted for a vaginal cylinder. The patient will be treated with a 2.5 cm diameter cylinder with  a treatment length of 3.0 cm. This distended the vaginal vault without undue discomfort. The patient tolerated the procedure well.  The patient was successfully fitted for a vaginal cylinder. The patient is appropriate to begin vaginal brachytherapy.   Plan: The patient will proceed with CT simulation and vaginal brachytherapy today.    _______________________________   Blair Promise, PhD, MD  This document serves as a record of  services personally performed by Gery Pray, MD. It was created on his behalf by Clerance Lav, a trained medical scribe. The creation of this record is based on the scribe's personal observations and the provider's statements to them. This document has been checked and approved by the attending provider.

## 2020-11-26 ENCOUNTER — Telehealth: Payer: Self-pay | Admitting: *Deleted

## 2020-11-26 NOTE — Telephone Encounter (Signed)
CALLED PATIENT TO REMIND OF HDR [place] 11-27-20 @ 9 AM, SPOKE WITH PATIENT'S DAUGHTER- VIOLETA AND SHE IS AWARE OF THIS [place].

## 2020-11-27 ENCOUNTER — Other Ambulatory Visit: Payer: Self-pay

## 2020-11-27 ENCOUNTER — Ambulatory Visit
Admission: RE | Admit: 2020-11-27 | Discharge: 2020-11-27 | Disposition: A | Discharge status: Home / Self Care | Payer: Medicare Other | Source: Ambulatory Visit | Attending: Radiation Oncology | Admitting: Radiation Oncology

## 2020-11-27 DIAGNOSIS — C541 Malignant neoplasm of endometrium: Secondary | ICD-10-CM

## 2020-11-27 DIAGNOSIS — Z510 Encounter for antineoplastic radiation therapy: Secondary | ICD-10-CM | POA: Diagnosis not present

## 2020-11-27 NOTE — Progress Notes (Signed)
  Radiation Oncology         [phone]) [phone] ________________________________  Name: Julie Orozco MRN: [id number]  Date: 11/27/2020  DOB: 05/03/1946  CC: Girtha Rm, NP-C  Girtha Rm, NP-C  HDR BRACHYTHERAPY NOTE  DIAGNOSIS: Stage III endometrioid adenocarcinoma (pT1a, pN46mi, M0)   Simple treatment device note: Patient had construction of her custom vaginal cylinder. She will be treated with a 2.5 cm diameter segmented cylinder. This conforms to her anatomy without undue discomfort.  Vaginal brachytherapy procedure node: The patient was brought to the [place] suite. Identity was confirmed. All relevant records and images related to the planned course of therapy were reviewed. The patient freely provided informed written consent to proceed with treatment after reviewing the details related to the planned course of therapy. The consent form was witnessed and verified by the simulation staff. Then, the patient was set-up in a stable reproducible supine position for radiation therapy. Pelvic exam revealed the vaginal cuff to be intact . The patient's custom vaginal cylinder was placed in the proximal vagina. This was affixed to the CT/MR stabilization plate to prevent slippage. Patient tolerated the placement well.  Verification simulation note:  A fiducial marker was placed within the vaginal cylinder. An AP and lateral film was then obtained through the pelvis area. This documented accurate position of the vaginal cylinder for treatment.  HDR BRACHYTHERAPY TREATMENT  The remote afterloading device was affixed to the vaginal cylinder by catheter. Patient then proceeded to undergo her first high-dose-rate treatment directed at the proximal vagina. The patient was prescribed a dose of 6.0 gray to be delivered to the mucosal surface. Treatment length was 3.0 cm. Patient was treated with 1 channel using 7 dwell positions. Treatment time was 274.7 seconds. Iridium 192 was the high-dose-rate  source for treatment. The patient tolerated the treatment well. After completion of her therapy, a radiation survey was performed documenting return of the iridium source into the Gamma[employer] safe.   PLAN: The patient will return next week for her second high-dose-rate treatment. ________________________________    Blair Promise, PhD, MD  This document serves as a record of services personally performed by Gery Pray, MD. It was created on his behalf by Clerance Lav, a trained medical scribe. The creation of this record is based on the scribe's personal observations and the provider's statements to them. This document has been checked and approved by the attending provider.

## 2020-12-01 ENCOUNTER — Telehealth: Payer: Self-pay | Admitting: *Deleted

## 2020-12-01 NOTE — Telephone Encounter (Signed)
CALLED PATIENT'S DAUGHTER- VIOLETA TO REMIND OF MOM'S HDR [place]. FOR 12-02-20 @ 11 AM, SPOKE WITH PATIENT'S [place] AND SHE IS AWARE OF THIS [place].

## 2020-12-02 ENCOUNTER — Ambulatory Visit
Admission: RE | Admit: 2020-12-02 | Discharge: 2020-12-02 | Disposition: A | Discharge status: Home / Self Care | Payer: Medicare Other | Source: Ambulatory Visit | Attending: Radiation Oncology | Admitting: Radiation Oncology

## 2020-12-02 ENCOUNTER — Other Ambulatory Visit: Payer: Self-pay

## 2020-12-02 DIAGNOSIS — C541 Malignant neoplasm of endometrium: Secondary | ICD-10-CM | POA: Insufficient documentation

## 2020-12-02 DIAGNOSIS — Z510 Encounter for antineoplastic radiation therapy: Secondary | ICD-10-CM | POA: Diagnosis not present

## 2020-12-02 NOTE — Progress Notes (Signed)
  Radiation Oncology         [phone]) [phone] ________________________________  Name: Julie Orozco MRN: [id number]  Date: 12/02/2020  DOB: 02/29/1946  CC: Girtha Rm, NP-C  Girtha Rm, NP-C  HDR BRACHYTHERAPY NOTE  DIAGNOSIS: Stage III endometrioid adenocarcinoma (pT1a, pN44mi, M0)   Simple treatment device note: Patient had construction of her custom vaginal cylinder. She will be treated with a 2.5 cm diameter segmented cylinder. This conforms to her anatomy without undue discomfort.  Vaginal brachytherapy procedure node: The patient was brought to the [place] suite. Identity was confirmed. All relevant records and images related to the planned course of therapy were reviewed. The patient freely provided informed written consent to proceed with treatment after reviewing the details related to the planned course of therapy. The consent form was witnessed and verified by the simulation staff. Then, the patient was set-up in a stable reproducible supine position for radiation therapy. Pelvic exam revealed the vaginal cuff to be intact . The patient's custom vaginal cylinder was placed in the proximal vagina. This was affixed to the CT/MR stabilization plate to prevent slippage. Patient tolerated the placement well.  Verification simulation note:  A fiducial marker was placed within the vaginal cylinder. An AP and lateral film was then obtained through the pelvis area. This documented accurate position of the vaginal cylinder for treatment.  HDR BRACHYTHERAPY TREATMENT  The remote afterloading device was affixed to the vaginal cylinder by catheter. Patient then proceeded to undergo her second high-dose-rate treatment directed at the proximal vagina. The patient was prescribed a dose of 6.0 gray to be delivered to the mucosal surface. Treatment length was 3.0 cm. Patient was treated with 1 channel using 7 dwell positions. Treatment time was 287.8 seconds. Iridium 192 was the high-dose-rate  source for treatment. The patient tolerated the treatment well. After completion of her therapy, a radiation survey was performed documenting return of the iridium source into the Gamma[employer] safe.   PLAN: The patient will return next week for her third high-dose-rate treatment. ________________________________    Blair Promise, PhD, MD  This document serves as a record of services personally performed by Gery Pray, MD. It was created on his behalf by Clerance Lav, a trained medical scribe. The creation of this record is based on the scribe's personal observations and the provider's statements to them. This document has been checked and approved by the attending provider.

## 2020-12-08 ENCOUNTER — Telehealth: Payer: Self-pay | Admitting: *Deleted

## 2020-12-08 NOTE — Telephone Encounter (Signed)
CALLED PATIENT'S Julie Orozco, TO REMIND OF HDR [place] 12-09-20 @ 1 PM, LVM FOR A RETURN CALL

## 2020-12-09 ENCOUNTER — Encounter: Payer: Self-pay | Admitting: Radiation Oncology

## 2020-12-09 ENCOUNTER — Ambulatory Visit
Admission: RE | Admit: 2020-12-09 | Discharge: 2020-12-09 | Disposition: A | Discharge status: Home / Self Care | Payer: Medicare Other | Source: Ambulatory Visit | Attending: Radiation Oncology | Admitting: Radiation Oncology

## 2020-12-09 ENCOUNTER — Other Ambulatory Visit: Payer: Self-pay

## 2020-12-09 DIAGNOSIS — C541 Malignant neoplasm of endometrium: Secondary | ICD-10-CM | POA: Diagnosis not present

## 2020-12-09 DIAGNOSIS — Z510 Encounter for antineoplastic radiation therapy: Secondary | ICD-10-CM | POA: Diagnosis not present

## 2020-12-09 NOTE — Progress Notes (Signed)
  Radiation Oncology         [phone]) [phone] ________________________________  Name: Julie Orozco MRN: [id number]  Date: 12/09/2020  DOB: 1946-07-12  CC: Girtha Rm, NP-C  Girtha Rm, NP-C  HDR BRACHYTHERAPY NOTE  DIAGNOSIS: Stage III endometrioid adenocarcinoma (pT1a, pN20mi, M0)   Simple treatment device note: Patient had construction of her custom vaginal cylinder. She will be treated with a 2.5 cm diameter segmented cylinder. This conforms to her anatomy without undue discomfort.  Vaginal brachytherapy procedure node: The patient was brought to the [place] suite. Identity was confirmed. All relevant records and images related to the planned course of therapy were reviewed. The patient freely provided informed written consent to proceed with treatment after reviewing the details related to the planned course of therapy. The consent form was witnessed and verified by the simulation staff. Then, the patient was set-up in a stable reproducible supine position for radiation therapy. Pelvic exam revealed the vaginal cuff to be intact . The patient's custom vaginal cylinder was placed in the proximal vagina. This was affixed to the CT/MR stabilization plate to prevent slippage. Patient tolerated the placement well.  Verification simulation note:  A fiducial marker was placed within the vaginal cylinder. An AP and lateral film was then obtained through the pelvis area. This documented accurate position of the vaginal cylinder for treatment.  HDR BRACHYTHERAPY TREATMENT  The remote afterloading device was affixed to the vaginal cylinder by catheter. Patient then proceeded to undergo her third high-dose-rate treatment directed at the proximal vagina. The patient was prescribed a dose of 6.0 gray to be delivered to the mucosal surface. Treatment length was 3.0 cm. Patient was treated with 1 channel using 7 dwell positions. Treatment time was 307.3 seconds. Iridium 192 was the high-dose-rate  source for treatment. The patient tolerated the treatment well. After completion of her therapy, a radiation survey was performed documenting return of the iridium source into the [employer] safe.   PLAN: The patient will return in month one month for routine follow-up. ________________________________    Blair Promise, PhD, MD  This document serves as a record of services personally performed by Gery Pray, MD. It was created on his behalf by Clerance Lav, a trained medical scribe. The creation of this record is based on the scribe's personal observations and the provider's statements to them. This document has been checked and approved by the attending provider.

## 2020-12-18 ENCOUNTER — Other Ambulatory Visit: Payer: Self-pay | Admitting: Family Medicine

## 2020-12-18 DIAGNOSIS — I10 Essential (primary) hypertension: Secondary | ICD-10-CM

## 2020-12-18 DIAGNOSIS — E119 Type 2 diabetes mellitus without complications: Secondary | ICD-10-CM

## 2020-12-25 ENCOUNTER — Ambulatory Visit: Payer: Self-pay | Admitting: Radiation Oncology

## 2021-01-05 ENCOUNTER — Encounter: Payer: Self-pay | Admitting: Radiation Oncology

## 2021-01-06 ENCOUNTER — Encounter: Payer: Self-pay | Admitting: Radiology

## 2021-01-11 NOTE — Progress Notes (Signed)
Radiation Oncology         [phone] ________________________________  Name: Julie Orozco MRN: [id number]  Date: 01/12/2021  DOB: 1946/08/27  Follow-Up Visit Note  CC: Girtha Rm, NP-C  Henson, Vickie L, NP-C    ICD-10-CM   1. Endometrial adenocarcinoma ([facility])  C54.1     Diagnosis:   Stage III endometrioid adenocarcinoma(pT1a, pN34mi, M0)  Interval Since Last Radiation:  1 month  Radiation treatment dates:    IMRT: 10/15/20 - 11/18/20 HDR: 3/31, 4/5, 12/09/20  Site/dose:    IMRT: Pelvis / 45 Gy in 25 Gy HDR: Vaginal Cylinder, 2.5 cm / 18 Gy in 3 fractions  Beams/energy:    IMRT: 6X HDR: Ir-192, 3.0 cm length, 1 channel and 7 dwells   Narrative:  The patient returns today for routine follow-up.  She is accompanied by her daughter who serves as the interpreter.  Patient is doing well at this time.  She has feelings of incomplete bladder emptying but has no pressure or pain.  She denies any hematuria vaginal bleeding or discharge.  She denies any bowel problems.                          Allergies:  has No Known Allergies.  Meds: Current Outpatient Medications  Medication Sig Dispense Refill  . acetaminophen (TYLENOL) 500 MG tablet Take 500-1,000 mg by mouth every 6 (six) hours as needed (pain.).    Marland Kitchen Cholecalciferol (VITAMIN D3) 25 MCG (1000 UT) CAPS Take 1,000 Units by mouth daily.    Marland Kitchen ibuprofen (ADVIL) 200 MG tablet Take 600 mg by mouth every 8 (eight) hours as needed (pain.).    Marland Kitchen lisinopril (ZESTRIL) 10 MG tablet Take 1 tablet by mouth once daily 90 tablet 0  . metFORMIN (GLUCOPHAGE) 500 MG tablet Take 1 tablet by mouth once daily with breakfast 90 tablet 0  . senna-docusate (SENOKOT-S) 8.6-50 MG tablet Take 2 tablets by mouth at bedtime. For AFTER surgery, do not take if having diarrhea 30 tablet 0  . simvastatin (ZOCOR) 20 MG tablet TAKE 1 TABLET BY MOUTH AT BEDTIME 90 tablet 0  . traMADol (ULTRAM) 50 MG tablet Take 1 tablet (50 mg total) by mouth  every 6 (six) hours as needed for moderate pain. For AFTER surgery only, do not take and drive 10 tablet 0   No current facility-administered medications for this encounter.    Physical Findings: The patient is in no acute distress. Patient is alert and oriented.  height is 5' (1.524 m) and weight is 169 lb 2 oz (76.7 kg). Her temporal temperature is 96.8 F (36 C) (abnormal). Her blood pressure is 138/65 and her pulse is 63. Her respiration is 18 and oxygen saturation is 99%. .  No significant changes. Lungs are clear to auscultation bilaterally. Heart has regular rate and rhythm. No palpable cervical, supraclavicular, or axillary adenopathy. Abdomen soft, non-tender, normal bowel sounds.  Pelvic exam deferred in light of recent completion of treatment.   Lab Findings: Lab Results  Component Value Date   WBC 10.9 (H) 08/18/2020   HGB 13.3 08/18/2020   HCT 41.1 08/18/2020   MCV 94.5 08/18/2020   PLT 290 08/18/2020    Radiographic Findings: No results found.  Impression:  Stage III endometrioid adenocarcinoma(pT1a, pN45mi, M0)  The patient is recovering from the effects of radiation.  She continues to have some days of feeling fatigued some days back to normal.  Bladder symptoms as above.  This sensation has been present since her surgery.  Plan: The patient will follow up with Dr. Berline Lopes in 2 months, radiation oncology in 5 months..  Today the patient was given a vaginal dilator and instructions on its use.    ____________________________________  Blair Promise, PhD, MD   This document serves as a record of services personally performed by Gery Pray, MD. It was created on his behalf by Roney Mans, a trained medical scribe. The creation of this record is based on the scribe's personal observations and the provider's statements to them. This document has been checked and approved by the attending provider.

## 2021-01-11 NOTE — Progress Notes (Incomplete)
  Radiation Oncology         [phone] ________________________________  Name: Julie Orozco MRN: [id number]  Date: 12/09/2020  DOB: [birth year]  End of Treatment Note  Diagnosis:   Stage III endometrioid adenocarcinoma(pT1a, pN58mi, M0)     Indication for treatment:  Curative       Radiation treatment dates:    IMRT: 10/15/20 - 11/18/20 HDR: 3/31, 4/5, 12/09/20  Site/dose:    IMRT: Pelvis / 45 Gy in 25 Gy HDR: Vaginal Cylinder, 2.5 cm / 18 Gy in 3 fractions  Beams/energy:    IMRT: 6X HDR: Ir-192, 3.0 cm length, 1 channel and 7 dwells  Narrative: The patient tolerated radiation treatment relatively well.   She experienced some feeling of incomplete bladder emptying, mild fatigue, and diarrhea that worsened as IMRT went on. She used imodium for relief of the diarrhea.  Plan: The patient has completed radiation treatment. The patient will return to radiation oncology clinic for routine followup in one month. I advised them to call or return sooner if they have any questions or concerns related to their recovery or treatment.  -----------------------------------  Blair Promise, PhD, MD  This document serves as a record of services personally performed by Gery Pray, MD. It was created on his behalf by Wilburn Mylar, a trained medical scribe. The creation of this record is based on the scribe's personal observations and the provider's statements to them. This document has been checked and approved by the attending provider.

## 2021-01-12 ENCOUNTER — Encounter: Payer: Self-pay | Admitting: Radiation Oncology

## 2021-01-12 ENCOUNTER — Other Ambulatory Visit: Payer: Self-pay

## 2021-01-12 ENCOUNTER — Ambulatory Visit
Admission: RE | Admit: 2021-01-12 | Discharge: 2021-01-12 | Disposition: A | Discharge status: Home / Self Care | Payer: Medicare Other | Source: Ambulatory Visit | Attending: Radiation Oncology | Admitting: Radiation Oncology

## 2021-01-12 DIAGNOSIS — Z7984 Long term (current) use of oral hypoglycemic drugs: Secondary | ICD-10-CM | POA: Diagnosis not present

## 2021-01-12 DIAGNOSIS — Z79899 Other long term (current) drug therapy: Secondary | ICD-10-CM | POA: Insufficient documentation

## 2021-01-12 DIAGNOSIS — C541 Malignant neoplasm of endometrium: Secondary | ICD-10-CM

## 2021-01-12 DIAGNOSIS — Z791 Long term (current) use of non-steroidal anti-inflammatories (NSAID): Secondary | ICD-10-CM | POA: Diagnosis not present

## 2021-01-12 DIAGNOSIS — R5383 Other fatigue: Secondary | ICD-10-CM | POA: Diagnosis not present

## 2021-01-12 HISTORY — DX: Personal history of irradiation: Z92.3

## 2021-01-12 NOTE — Progress Notes (Signed)
Katheline Brendlinger is here today for follow up post radiation to the pelvic.  They completed their radiation on: 12/09/20  Does the patient complain of any of the following:  . Pain:Patient denies any pain.  . Abdominal bloating: no . Diarrhea/Constipation: no . Nausea/Vomiting: no . Vaginal Discharge: no . Blood in Urine or Stool: no . Urinary Issues (dysuria/incomplete emptying/ incontinence/ increased frequency/urgency):  Patient denies dysuria, Patient reports not being able to fully empty her bladder.  . Does patient report using vaginal dilator 2-3 times a week and/or sexually active 2-3 weeks: Patient given xs+ and s dilators with instructions, patient and daughter voiced understanding. Marland Kitchen Post radiation skin changes: no   Additional comments if applicable:  Vitals:   20/94/70 1540  BP: 138/65  Pulse: 63  Resp: 18  Temp: (!) 96.8 F (36 C)  TempSrc: Temporal  SpO2: 99%  Weight: 169 lb 2 oz (76.7 kg)  Height: 5' (1.524 m)

## 2021-01-12 NOTE — Patient Instructions (Signed)

## 2021-01-22 ENCOUNTER — Telehealth: Payer: Self-pay | Admitting: *Deleted

## 2021-01-22 ENCOUNTER — Telehealth: Payer: Self-pay

## 2021-01-22 NOTE — Telephone Encounter (Signed)
Enid Derry from Dr. Lois Huxley office called to setup a follow up appointment for Julie Orozco. It has been scheduled for July 8th at 1:30pm with Dr. Berline Lopes. Enid Derry will provide appointment details to the patient.

## 2021-01-22 NOTE — Telephone Encounter (Signed)
CALLED PATIENT TO INFORM OF FU WITH DR. Berline Lopes ON 03-06-21 - ARRIVAL TIME- 1 PM, UNABLE TO LEAVE MESSAGE DUE TO VM BEING FULL, MAILED APPT. CARD

## 2021-03-06 ENCOUNTER — Inpatient Hospital Stay: Payer: Medicare Other | Admitting: Gynecologic Oncology

## 2021-03-06 ENCOUNTER — Other Ambulatory Visit: Payer: Self-pay

## 2021-03-06 VITALS — BP 131/70 | HR 69 | Temp 97.2°F | Resp 18 | Ht 60.0 in | Wt 171.2 lb

## 2021-03-09 ENCOUNTER — Inpatient Hospital Stay: Payer: Medicare Other | Attending: Gynecologic Oncology | Admitting: Gynecologic Oncology

## 2021-03-09 ENCOUNTER — Encounter: Payer: Self-pay | Admitting: Gynecologic Oncology

## 2021-03-09 ENCOUNTER — Other Ambulatory Visit: Payer: Self-pay

## 2021-03-09 VITALS — BP 138/77 | HR 60 | Temp 98.7°F | Resp 18 | Ht 60.0 in | Wt 171.0 lb

## 2021-03-09 DIAGNOSIS — E669 Obesity, unspecified: Secondary | ICD-10-CM | POA: Insufficient documentation

## 2021-03-09 DIAGNOSIS — Z79899 Other long term (current) drug therapy: Secondary | ICD-10-CM | POA: Diagnosis not present

## 2021-03-09 DIAGNOSIS — Z90710 Acquired absence of both cervix and uterus: Secondary | ICD-10-CM | POA: Insufficient documentation

## 2021-03-09 DIAGNOSIS — Z923 Personal history of irradiation: Secondary | ICD-10-CM | POA: Insufficient documentation

## 2021-03-09 DIAGNOSIS — C541 Malignant neoplasm of endometrium: Secondary | ICD-10-CM | POA: Diagnosis not present

## 2021-03-09 DIAGNOSIS — Z90722 Acquired absence of ovaries, bilateral: Secondary | ICD-10-CM | POA: Insufficient documentation

## 2021-03-09 DIAGNOSIS — E785 Hyperlipidemia, unspecified: Secondary | ICD-10-CM | POA: Diagnosis not present

## 2021-03-09 DIAGNOSIS — Z6833 Body mass index (BMI) 33.0-33.9, adult: Secondary | ICD-10-CM | POA: Diagnosis not present

## 2021-03-09 DIAGNOSIS — Z7984 Long term (current) use of oral hypoglycemic drugs: Secondary | ICD-10-CM | POA: Insufficient documentation

## 2021-03-09 DIAGNOSIS — M8580 Other specified disorders of bone density and structure, unspecified site: Secondary | ICD-10-CM | POA: Insufficient documentation

## 2021-03-09 DIAGNOSIS — I10 Essential (primary) hypertension: Secondary | ICD-10-CM | POA: Insufficient documentation

## 2021-03-09 DIAGNOSIS — E119 Type 2 diabetes mellitus without complications: Secondary | ICD-10-CM | POA: Diagnosis not present

## 2021-03-09 DIAGNOSIS — M79606 Pain in leg, unspecified: Secondary | ICD-10-CM

## 2021-03-09 NOTE — Patient Instructions (Addendum)
It was great to see you today!  I do not see or feel any evidence of cancer recurrence on your exam.  As we discussed, it is very important that you continue to use your vaginal dilator so that I am able to do exams at your visit.  I recommend using the dilator twice a week at minimum if possible.  In terms of your muscular/leg pain, we discussed several interventions that can be started today including using the foam roller, massage gun, continued heat therapy, and trying compression socks.  I am putting in a referral for physical therapy as well.  If these interventions do not help, or the character of the pain changes, please call me and we can try a medication to treat nerve pain.  Based on my exam today and your description of the pain, I think this is more related to muscular pain.  I will see you back in 6 months.  My schedule is not out for January at this time.  Please call towards the end of the year to get scheduled to see me in mid January 2023.

## 2021-03-09 NOTE — Progress Notes (Signed)
Gynecologic Oncology Return Clinic Visit  03/09/21  Reason for Visit: surveillance visit in the setting of advanced stage uterine cancer  Treatment History: Oncology History Overview Note  IHC - loss of MLH1 and PMS2 MLH1 promoter hypermethylation present   Endometrial adenocarcinoma ([place])  07/18/2020 Surgery   D&C: grade 1 EMCA    Initial Diagnosis   Endometrial adenocarcinoma ([place])   08/27/2020 Surgery   Robotic-assisted laparoscopic total hysterectomy with bilateral salpingoophorectomy, SLN biopsy    08/27/2020 Pathology Results   A. SENTINEL LYMPH NODE, RIGHT OBTURATOR, BIOPSY:  - Lymph node, negative for carcinoma (0/1)   B. SENTINEL LYMPH NODE, RIGHT EXTERNAL ILIAC, BIOPSY:  - Lymph node, negative for carcinoma (0/1)   C. 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 UTERUS, CERVIX, BILATERAL TUBES AND OVARIES:  - Endometrioid adenocarcinoma, FIGO grade 2, see comment  - Carcinoma invades for a depth of 0.6 cm with a myometrial thickness is  2.0 cm  - Benign unremarkable cervix  - Benign bilateral fallopian tubes and ovaries  - See oncology table    COMMENT:   C.   Immunostain for pancytokeratin (AE1/AE3) highlights focus of  metastatic carcinoma.   D.  Immunohistochemical stains show that the tumor cells show wild-type  (normal) expression pattern for p53 and are negative for p16, consistent  with above interpretation.  The tumor shows 2 different morphologies  with the majority tumor showing a FIGO grade 1 endometrial  adenocarcinoma and a second component with FIGO grade 2 features.  Dr.  Tresa Moore has reviewed part C and D and concurs with the diagnosis.  Dr.  Berline Lopes was notified on 09/02/2020.      ONCOLOGY TABLE:   UTERUS, CARCINOMA OR CARCINOSARCOMA: Resection   Procedure: Total hysterectomy and bilateral salpingo-oophorectomy  Histologic Type: Endometrioid adenocarcinoma  Histologic  Grade: FIGO grade 2  Myometrial Invasion:       Depth of Myometrial Invasion (mm): 6 mm       Myometrial Thickness (mm): 20 mm       Percentage of Myometrial Invasion: Less than 50%  Uterine Serosa Involvement: Not identified  Cervical stromal Involvement: Not identified  Extent of involvement of other tissue/organs: Not identified  Peritoneal/Ascitic Fluid: Not applicable  Lymphovascular Invasion: Not identified  Regional Lymph Nodes:       Pelvic Lymph Nodes Examined:  3 Sentinel             0 Non-sentinel             3 Total       Pelvic Lymph Nodes with Metastasis: 1             Macrometastasis: (>2.0 mm): 0             Micrometastasis: (>0.2 mm and < 2.0 mm): 1             Isolated Tumor Cells (<0.2 mm): 0             Laterality of Lymph Node with Tumor: Left             Extracapsular Extension: Not identified       Para-aortic Lymph Nodes Examined:              0 Sentinel              0 Non-sentinel              0 Total  Distant Metastasis:       Distant Site(s) Involved: Not applicable  Pathologic Stage Classification (pTNM, AJCC 8th Edition): pT1a, pN75mi  Ancillary Studies: MMR / MSI testing will be ordered  Representative Tumor Block: D5  Comment(s): Pancytokeratin was performed on the lymph nodes.    08/27/2020 Cancer Staging   Staging form: Corpus Uteri - Carcinoma and Carcinosarcoma, AJCC 8th Edition - Clinical stage from 08/27/2020: FIGO Stage IIIC1 (cT1a, cN26mi, cM0) - Signed by Carver Fila, MD on 09/03/2020    10/15/2020 - 12/09/2020 Radiation Therapy   Radiation treatment dates:    IMRT: 10/15/20 - 11/18/20 HDR: 3/31, 4/5, 12/09/20   Site/dose:    IMRT: Pelvis / 45 Gy in 25 Gy HDR: Vaginal Cylinder, 2.5 cm / 18 Gy in 3 fractions   Beams/energy:    IMRT: 6X HDR: Ir-192, 3.0 cm length, 1 channel and 7 dwells     Interval History: The patient presents today for surveillance visit after finishing adjuvant treatment in April. She last saw Dr. Roselind Messier on  5/16, was doing well at that time.  She comes in with her daughter today.  She notes that overall she is doing well.  The symptoms that she had with radiation, including fatigue, intermittent dizziness, and intermittent diarrhea, have resolved completely.  She endorses a good appetite without nausea or emesis.  She reports regular bowel function.  She denies any urinary symptoms.  She denies any vaginal bleeding or discharge.  She reports having some upper lateral leg pain to the knees and sometimes in her lower legs, worse at night.  Does not respond to Tylenol or Motrin.  She has had some relief with heat therapy.  She describes the pain as constant and achy, like she has used her muscles during the day.  She sometimes have this pain during the daytime, although much more likely to happen and worse at night.  Past Medical/Surgical History: Past Medical History:  Diagnosis Date   DM type 2 (diabetes mellitus, type 2) (HCC)    Elevated ALT measurement 04/18/2018   resolved   Endometrial cancer [facility])    History of radiation therapy 10/15/20-11/18/20   IMRT endometrium - Dr. Antony Blackbird   History of radiation therapy 11/27/2020, 12/02/2020, 12/09/2020   vaginal brachytherapy - HDR to endometrium      Dr Antony Blackbird   Hyperlipidemia    Hypertension    Obesity    Osteopenia determined by x-ray    PMB (postmenopausal bleeding)    SVD (spontaneous vaginal delivery)    x 8    Past Surgical History:  Procedure Laterality Date   DILATATION & CURETTAGE/HYSTEROSCOPY WITH MYOSURE N/A 07/18/2020   Procedure: DILATATION & CURETTAGE/HYSTEROSCOPY WITH MYOSURE;  Surgeon: Genia Del, MD;  Location: [facility];  Service: Gynecology;  Laterality: N/A;   DILATION AND CURETTAGE OF UTERUS N/A 01/25/2014   Procedure: DILATATION AND CURETTAGE;  Surgeon: Allie Bossier, MD;  Location: [facility];  Service: Gynecology;  Laterality: N/A;   MULTIPLE TOOTH EXTRACTIONS  yrs ago   ROBOTIC ASSISTED TOTAL  HYSTERECTOMY WITH BILATERAL SALPINGO OOPHERECTOMY N/A 08/27/2020   Procedure: XI ROBOTIC ASSISTED TOTAL HYSTERECTOMY WITH BILATERAL SALPINGO OOPHORECTOMY,;  Surgeon: Carver Fila, MD;  Location: [facility];  Service: Gynecology;  Laterality: N/A;   SENTINEL NODE BIOPSY N/A 08/27/2020   Procedure: SENTINEL LYMPH NODE BIOPSY;  Surgeon: Carver Fila, MD;  Location: [facility];  Service: Gynecology;  Laterality: N/A;   TUBAL LIGATION  yrs ago  Family History  Problem Relation Age of Onset   Hypertension Mother    Alcohol abuse Father    Anemia Brother    Alcohol abuse Brother    Breast cancer Cousin        maternal cousin   Colon cancer Neg Hx    Esophageal cancer Neg Hx    Rectal cancer Neg Hx    Stomach cancer Neg Hx    Ovarian cancer Neg Hx     Social History   Socioeconomic History   Marital status: Divorced    Spouse name: Not on file   Number of children: Not on file   Years of education: Not on file   Highest education level: Not on file  Occupational History   Not on file  Tobacco Use   Smoking status: Never   Smokeless tobacco: Never  Vaping Use   Vaping Use: Never used  Substance and Sexual Activity   Alcohol use: No   Drug use: No   Sexual activity: Not Currently    Birth control/protection: Post-menopausal  Other Topics Concern   Not on file  Social History Narrative   Not on file   Social Determinants of Health   Financial Resource Strain: Not on file  Food Insecurity: Not on file  Transportation Needs: Not on file  Physical Activity: Not on file  Stress: Not on file  Social Connections: Not on file    Current Medications:  Current Outpatient Medications:    acetaminophen (TYLENOL) 500 MG tablet, Take 500-1,000 mg by mouth every 6 (six) hours as needed (pain.)., Disp: , Rfl:    Cholecalciferol (VITAMIN D3) 25 MCG (1000 UT) CAPS, Take 1,000 Units by mouth daily., Disp: , Rfl:    ibuprofen (ADVIL) 200 MG tablet, Take 600 mg by mouth every 8  (eight) hours as needed (pain.)., Disp: , Rfl:    lisinopril (ZESTRIL) 10 MG tablet, Take 1 tablet by mouth once daily, Disp: 90 tablet, Rfl: 0   metFORMIN (GLUCOPHAGE) 500 MG tablet, Take 1 tablet by mouth once daily with breakfast, Disp: 90 tablet, Rfl: 0   senna-docusate (SENOKOT-S) 8.6-50 MG tablet, Take 2 tablets by mouth at bedtime. For AFTER surgery, do not take if having diarrhea, Disp: 30 tablet, Rfl: 0   simvastatin (ZOCOR) 20 MG tablet, TAKE 1 TABLET BY MOUTH AT BEDTIME, Disp: 90 tablet, Rfl: 0   traMADol (ULTRAM) 50 MG tablet, Take 1 tablet (50 mg total) by mouth every 6 (six) hours as needed for moderate pain. For AFTER surgery only, do not take and drive, Disp: 10 tablet, Rfl: 0  Review of Systems: Pertinent positives as per HPI Denies appetite changes, fevers, chills, fatigue, unexplained weight changes. Denies hearing loss, neck lumps or masses, mouth sores, ringing in ears or voice changes. Denies cough or wheezing.  Denies shortness of breath. Denies chest pain or palpitations. Denies leg swelling. Denies abdominal distention, pain, blood in stools, constipation, diarrhea, nausea, vomiting, or early satiety. Denies pain with intercourse, dysuria, frequency, hematuria or incontinence. Denies hot flashes, pelvic pain, vaginal bleeding or vaginal discharge.   Denies itching, rash, or wounds. Denies dizziness, headaches, numbness or seizures. Denies swollen lymph nodes or glands, denies easy bruising or bleeding. Denies anxiety, depression, confusion, or decreased concentration.  Physical Exam: BP 138/77 (BP Location: Left Arm, Patient Position: Sitting)   Pulse 60   Temp 98.7 F (37.1 C) (Oral)   Resp 18   Ht 5' (1.524 m)   Wt 171 lb (77.6 kg)  SpO2 100% Comment: RA  BMI 33.40 kg/m  General: Alert, oriented, no acute distress. HEENT: Posterior oropharynx clear, sclera anicteric. Chest: Clear to auscultation bilaterally.  No wheezes or rhonchi. Cardiovascular:  Regular rate and rhythm, no murmurs. Abdomen: Obese, soft, nontender.  Normoactive bowel sounds.  No masses or hepatosplenomegaly appreciated.  Well-healed incisions. Extremities: Grossly normal range of motion.  Warm, well perfused.  No edema bilaterally.  Patient has mild pain with palpation over IT band bilaterally. Skin: No rashes or lesions noted. Lymphatics: No cervical, supraclavicular, or inguinal adenopathy. GU: Normal appearing external genitalia without erythema, excoriation, or lesions.  Speculum exam reveals moderately atrophic vaginal mucosa, cuff intact, no lesions noted.  No discharge or bleeding.  Bimanual exam reveals cuff intact, no nodularity.  Rectovaginal exam confirms findings, confirms no masses.  Laboratory & Radiologic Studies: None new  Assessment & Plan: Julie Orozco is a 75 y.o. 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  Patient is doing well today and is NED on exam.  We discussed signs and symptoms that would be concerning for disease recurrence, and the patient knows to call if she develops any of these between visits.  I stressed the importance of continued dilator use to help prevent vaginal agglutination and so that exams are not limited.  Given her high risk disease, we will continue with visits every 3 months. She is scheduled to see Dr. Sondra Come for follow-up on 10/17. I will see her back in mid-January 2023.  She is mostly bothered by lower extremity pain, which sounds muscular in origin.  This is causing her to not sleep well.  She has some tenderness that can be elicited on exam along her IT bands.  My suspicion is that this is musculoskeletal in nature.  Her description of the pain in its location does not sound consistent with sciatica.  We discussed some interventions that she can start doing today including foam rolling, massage gun, and  continued heat therapy.  I also recommended that they could get compression socks to see if this helps for sleeping.  We discussed referral to physical therapy.  The patient was interested in this.  I placed a referral today.  I have asked the patient's daughter to call me to let me know how she is doing after a couple weeks of the interventions that we discussed today.  If the character of the pain changes or if she does not have relief with interventions targeting muscular pain, then we discussed a trial of gabapentin for neuropathic pain.  She does not have any appreciable lower extremity edema and I think this is unlikely related to treatment induced lymphedema.  42 minutes of total time was spent for this patient encounter, including preparation, face-to-face counseling with the patient and coordination of care, and documentation of the encounter.  Jeral Pinch, MD  Division of Gynecologic Oncology  Department of Obstetrics and Gynecology  [facility]

## 2021-03-10 ENCOUNTER — Inpatient Hospital Stay: Payer: Medicare Other | Admitting: Gynecologic Oncology

## 2021-03-16 ENCOUNTER — Other Ambulatory Visit: Payer: Self-pay | Admitting: Family Medicine

## 2021-03-16 DIAGNOSIS — I10 Essential (primary) hypertension: Secondary | ICD-10-CM

## 2021-04-08 ENCOUNTER — Encounter: Payer: Self-pay | Admitting: Internal Medicine

## 2021-04-17 ENCOUNTER — Telehealth: Payer: Self-pay | Admitting: Family Medicine

## 2021-04-17 MED ORDER — SIMVASTATIN 20 MG PO TABS: 20.0000 mg | ORAL_TABLET | Freq: Every day | ORAL | 0 refills | Status: DC

## 2021-04-17 NOTE — Telephone Encounter (Signed)
Pt needs refill on Simvastatin sent to the [facility] on [address]. She is scheduled for a med check on 8/30

## 2021-04-17 NOTE — Telephone Encounter (Signed)
sent 

## 2021-04-27 NOTE — Progress Notes (Signed)
Subjective:    Patient ID: Julie Orozco, female    DOB: 1945/11/13, 75 y.o.   MRN: [id number]  HPI Chief Complaint  Patient presents with   other    Med check going through radiation therapy still tired and can't sleep at night, pain in hip going down to her knee getting worse since treatment ending in April.     She is here today for a medication management visit and to follow up on several chronic health conditions.  Completed her radiation treatment for endometrial cancer in April 2022.  Uterus removed. Under care of oncologist.   Her daughter is with her, Interpreting today  Complains of a 4 month history of worsening right upper lateral leg pain. Pain is constant and worse at night when laying down. Able to function during the day. Takes Tylenol and occasionally an NSAID. Using heat and a foam roller.  Daughter states she has not heard from [facility] outpatient rehab for PT and the referral was placed several months ago.  States her oncologist was willing to start gabapentin but she did not want it at that time. Now willing to try medication.   DM- last Hgb A1c 6.3% in 07/2020. Highest A1c 7.0% in 2020.  Taking metformin daily and no issues.  Does not check BS at home Eye exam is due and they are aware.   Reports taking statin daily. No concerns.   HTN- BP at home- not checking  Taking lisinopril without issues   Diet/exercise: diet unchanged and fairly healthy.  Walks for exercise.   Osteopenia- due for repeat DEXA in 06/2021  Vitamin D def- taking supplement.   Denies fever, chills, dizziness, chest pain, palpitations, shortness of breath, abdominal pain, back pain, N/V/D, urinary symptoms, LE edema.   Reviewed allergies, medications, past medical, surgical, family, and social history.   Review of Systems Pertinent positives and negatives in the history of present illness.     Objective:   Physical Exam Constitutional:      General: She is not in acute  distress.    Appearance: Normal appearance. She is not ill-appearing.  Eyes:     Conjunctiva/sclera: Conjunctivae normal.     Pupils: Pupils are equal, round, and reactive to light.  Cardiovascular:     Rate and Rhythm: Normal rate and regular rhythm.     Pulses: Normal pulses.     Heart sounds: Normal heart sounds.  Pulmonary:     Effort: Pulmonary effort is normal.     Breath sounds: Normal breath sounds.  Musculoskeletal:     Cervical back: Normal, normal range of motion and neck supple.     Thoracic back: Normal.     Lumbar back: Normal.     Right hip: Normal.     Right upper leg: Tenderness present.     Right knee: Normal.     Right lower leg: Normal.     Left lower leg: Normal.     Right foot: Normal.       Legs:     Comments: Tenderness to deep palpation along IT band. Normal sensation, ROM and strength of bilateral LE.   Skin:    General: Skin is warm and dry.     Findings: No bruising.  Neurological:     General: No focal deficit present.     Mental Status: She is alert and oriented to person, place, and time.     Cranial Nerves: No cranial nerve deficit.     Sensory:  No sensory deficit.     Motor: No weakness.     Gait: Gait normal.     Deep Tendon Reflexes: Reflexes normal.  Psychiatric:        Mood and Affect: Mood normal.        Thought Content: Thought content normal.   BP 130/82   Pulse (!) 59   Temp 98.3 F (36.8 C)   Wt 170 lb (77.1 kg)   BMI 33.20 kg/m       Assessment & Plan:  Controlled type 2 diabetes mellitus without complication, without long-term current use of insulin (HCC) - Plan: CBC with Differential/Platelet, Comprehensive metabolic panel, Hemoglobin A1c, TSH, Microalbumin / creatinine urine ratio -continue metformin and healthy diet, exercise. Does not check BS at home. Follow up pending Hgb A1c. She will call and schedule diabetic eye exam. Follow up pending labs.   Essential hypertension - Plan: CBC with Differential/Platelet,  Comprehensive metabolic panel -BP controlled. Continue medication and low sodium diet. Stay active.   Vitamin D deficiency - Plan: VITAMIN D 25 Hydroxy (Vit-D Deficiency, Fractures) -continue vitamin D and get calcium in diet. Follow up pending vitamin D result.   Osteopenia, unspecified location -needs repeat DEXA in 06/2021. Continue vitamin D supplement and getting adequate calcium in diet. Discussed weight bearing exercise.   Medication management - Plan: VITAMIN D 25 Hydroxy (Vit-D Deficiency, Fractures), Lipid panel  Chronic pain of right lower extremity - Plan: Ambulatory referral to Physical Therapy, gabapentin (NEURONTIN) 100 MG capsule -appears to be MSK in nature and probable IT band issue. Normal sensation, motion of back and hips. RLE neurovascularly intact. Try low dose gabapentin at bedtime for now. Discussed sedating nature and potential side effects. Continue Tylenol, heat, stretching and massage. Referral to PT, [facility] or wherever she can be seen fairly soon since she has been waiting several weeks on [facility] outpatient rehab to schedule her.   Elevated LDL cholesterol level - Plan: Lipid panel -continue statin, low fat diet. Follow up pending lipid panel.

## 2021-04-28 ENCOUNTER — Encounter: Payer: Self-pay | Admitting: Family Medicine

## 2021-04-28 ENCOUNTER — Other Ambulatory Visit: Payer: Self-pay

## 2021-04-28 ENCOUNTER — Ambulatory Visit (INDEPENDENT_AMBULATORY_CARE_PROVIDER_SITE_OTHER): Payer: Medicare Other | Admitting: Family Medicine

## 2021-04-28 VITALS — BP 130/82 | HR 59 | Temp 98.3°F | Wt 170.0 lb

## 2021-04-28 DIAGNOSIS — I10 Essential (primary) hypertension: Secondary | ICD-10-CM | POA: Diagnosis not present

## 2021-04-28 DIAGNOSIS — E119 Type 2 diabetes mellitus without complications: Secondary | ICD-10-CM

## 2021-04-28 DIAGNOSIS — E7800 Pure hypercholesterolemia, unspecified: Secondary | ICD-10-CM | POA: Diagnosis not present

## 2021-04-28 DIAGNOSIS — E559 Vitamin D deficiency, unspecified: Secondary | ICD-10-CM

## 2021-04-28 DIAGNOSIS — G8929 Other chronic pain: Secondary | ICD-10-CM | POA: Diagnosis not present

## 2021-04-28 DIAGNOSIS — M8580 Other specified disorders of bone density and structure, unspecified site: Secondary | ICD-10-CM | POA: Diagnosis not present

## 2021-04-28 DIAGNOSIS — M79604 Pain in right leg: Secondary | ICD-10-CM | POA: Diagnosis not present

## 2021-04-28 DIAGNOSIS — Z79899 Other long term (current) drug therapy: Secondary | ICD-10-CM

## 2021-04-28 MED ORDER — GABAPENTIN 100 MG PO CAPS: 100.0000 mg | ORAL_CAPSULE | Freq: Every day | ORAL | 1 refills | Status: DC

## 2021-04-28 NOTE — Patient Instructions (Signed)
Try the gabapentin at bedtime for your leg pain. If it does not help, you can stop it.   Continue with Tylenol and Motrin as needed.  Use heat and stretching as discussed.   I am referring you to [facility] therapy.   Let me know if you do not hear from them in one week.   Follow up in 4 weeks for leg pain.

## 2021-04-29 LAB — CBC WITH DIFFERENTIAL/PLATELET
Basophils Absolute: 0.1 10*3/uL (ref 0.0–0.2)
Basos: 1 %
EOS (ABSOLUTE): 0.2 10*3/uL (ref 0.0–0.4)
Eos: 4 %
Hematocrit: 37.9 % (ref 34.0–46.6)
Hemoglobin: 12.7 g/dL (ref 11.1–15.9)
Immature Grans (Abs): 0 10*3/uL (ref 0.0–0.1)
Immature Granulocytes: 0 %
Lymphocytes Absolute: 0.9 10*3/uL (ref 0.7–3.1)
Lymphs: 17 %
MCH: 30.8 pg (ref 26.6–33.0)
MCHC: 33.5 g/dL (ref 31.5–35.7)
MCV: 92 fL (ref 79–97)
Monocytes Absolute: 0.6 10*3/uL (ref 0.1–0.9)
Monocytes: 11 %
Neutrophils Absolute: 3.6 10*3/uL (ref 1.4–7.0)
Neutrophils: 67 %
Platelets: 232 10*3/uL (ref 150–450)
RBC: 4.13 x10E6/uL (ref 3.77–5.28)
RDW: 12.6 % (ref 11.7–15.4)
WBC: 5.4 10*3/uL (ref 3.4–10.8)

## 2021-04-29 LAB — COMPREHENSIVE METABOLIC PANEL
ALT: 15 IU/L (ref 0–32)
AST: 14 IU/L (ref 0–40)
Albumin/Globulin Ratio: 1.6 (ref 1.2–2.2)
Albumin: 4.4 g/dL (ref 3.7–4.7)
Alkaline Phosphatase: 128 IU/L — ABNORMAL HIGH (ref 44–121)
BUN/Creatinine Ratio: 26 (ref 12–28)
BUN: 16 mg/dL (ref 8–27)
Bilirubin Total: 0.3 mg/dL (ref 0.0–1.2)
CO2: 25 mmol/L (ref 20–29)
Calcium: 9.8 mg/dL (ref 8.7–10.3)
Chloride: 102 mmol/L (ref 96–106)
Creatinine, Ser: 0.62 mg/dL (ref 0.57–1.00)
Globulin, Total: 2.8 g/dL (ref 1.5–4.5)
Glucose: 108 mg/dL — ABNORMAL HIGH (ref 65–99)
Potassium: 4.8 mmol/L (ref 3.5–5.2)
Sodium: 140 mmol/L (ref 134–144)
Total Protein: 7.2 g/dL (ref 6.0–8.5)
eGFR: 94 mL/min/{1.73_m2} (ref >59)

## 2021-04-29 LAB — LIPID PANEL
Chol/HDL Ratio: 3.3 ratio (ref 0.0–4.4)
Cholesterol, Total: 173 mg/dL (ref 100–199)
HDL: 53 mg/dL (ref >39)
LDL Chol Calc (NIH): 101 mg/dL — ABNORMAL HIGH (ref 0–99)
Triglycerides: 107 mg/dL (ref 0–149)
VLDL Cholesterol Cal: 19 mg/dL (ref 5–40)

## 2021-04-29 LAB — MICROALBUMIN / CREATININE URINE RATIO
Creatinine, Urine: 101.3 mg/dL
Microalb/Creat Ratio: 92 mg/g creat — ABNORMAL HIGH (ref 0–29)
Microalbumin, Urine: 93.5 ug/mL

## 2021-04-29 LAB — HEMOGLOBIN A1C
Est. average glucose Bld gHb Est-mCnc: 131 mg/dL
Hgb A1c MFr Bld: 6.2 % — ABNORMAL HIGH (ref 4.8–5.6)

## 2021-04-29 LAB — TSH: TSH: 1.8 u[IU]/mL (ref 0.450–4.500)

## 2021-04-29 LAB — VITAMIN D 25 HYDROXY (VIT D DEFICIENCY, FRACTURES): Vit D, 25-Hydroxy: 35.4 ng/mL (ref 30.0–100.0)

## 2021-04-29 NOTE — Progress Notes (Signed)
Please let her know that her blood work is fine, basically the same as 8 months ago. Diabetes is controlled. Ask if she needs refill of her statin and send this in for #90 with 1 refill.  Her urine protein check was moderately increased and this needs to be rechecked in 3 months. (Urine microalbumin). Her kidney function is normal in the blood work so I am not worried but we need to keep monitoring this.

## 2021-04-30 ENCOUNTER — Other Ambulatory Visit: Payer: Self-pay

## 2021-04-30 DIAGNOSIS — R770 Abnormality of albumin: Secondary | ICD-10-CM

## 2021-05-25 ENCOUNTER — Ambulatory Visit (INDEPENDENT_AMBULATORY_CARE_PROVIDER_SITE_OTHER): Payer: Medicare Other | Admitting: Family Medicine

## 2021-05-25 ENCOUNTER — Encounter: Payer: Self-pay | Admitting: Family Medicine

## 2021-05-25 ENCOUNTER — Other Ambulatory Visit: Payer: Self-pay

## 2021-05-25 VITALS — BP 110/70 | HR 64 | Ht 60.0 in | Wt 169.0 lb

## 2021-05-25 DIAGNOSIS — G4700 Insomnia, unspecified: Secondary | ICD-10-CM | POA: Diagnosis not present

## 2021-05-25 DIAGNOSIS — G8929 Other chronic pain: Secondary | ICD-10-CM

## 2021-05-25 DIAGNOSIS — Z23 Encounter for immunization: Secondary | ICD-10-CM | POA: Diagnosis not present

## 2021-05-25 DIAGNOSIS — M79604 Pain in right leg: Secondary | ICD-10-CM | POA: Diagnosis not present

## 2021-05-25 MED ORDER — TRAZODONE HCL 50 MG PO TABS
25.0000 mg | ORAL_TABLET | Freq: Every evening | ORAL | 1 refills | Status: DC | PRN
Start: 2021-05-25 — End: 2022-02-26

## 2021-05-25 NOTE — Progress Notes (Signed)
   Subjective:    Patient ID: Julie Orozco, female    DOB: 1946/03/14, 75 y.o.   MRN: [id number]  HPI Chief Complaint  Patient presents with   Leg Pain    4 week follow up, Has improved - pain still present but is minimal and is no longer painful to touch, is only taking Gabapentin when needed   Insomnia    Problems falling asleep   Her daughter is with her and assisting with interpretation.   Complains of difficulty falling asleep for the past year or longer. It takes her 90 minutes to fall asleep and wakes up 2 or 3 am and then falls asleep again at 5 am. 3/7 days per week.   She has tried melatonin gummies. No caffeine, alcohol or food before going to bed.  States she has tried good sleep hygiene including a cool, dark, quite room.  Does not take daytime naps.   Following up on leg pain. Gabapentin works and takes this only as needed.  She is scheduled to begin PT next week.    Review of Systems Pertinent positives and negatives in the history of present illness.     Objective:   Physical Exam BP 110/70   Pulse 64   Ht 5' (1.524 m)   Wt 169 lb (76.7 kg)   SpO2 97%   BMI 33.01 kg/m   And in no acute distress.  Respirations unlabored.      Assessment & Plan:  Chronic pain of right lower extremity  Insomnia, unspecified type - Plan: traZODone (DESYREL) 50 MG tablet  Right lower extremity pain improving.  Continue gabapentin as needed.  She will start PT next week. Counseling on good sleep hygiene.  Melatonin has not worked.  She will try trazodone 25 mg nightly.  Discussed having a dedicated 8 hours to sleep to avoid morning drowsiness.

## 2021-05-25 NOTE — Addendum Note (Signed)
Addended by: Sheilah Pigeon A on: 05/25/2021 09:55 AM   Modules accepted: Orders

## 2021-05-25 NOTE — Patient Instructions (Signed)
Trazodone Tablets Qu es este medicamento? La TRAZODONA se [place] para tratar la depresin. Aumenta la cantidad de serotonina en el cerebro, una hormona que ayuda a regular el estado de nimo. Este medicamento puede ser utilizado para otros usos; si tiene alguna pregunta consulte con su proveedor de atencin mdica o con su farmacutico. MARCAS COMUNES: Desyrel Qu le debo informar a mi profesional de la salud antes de tomar este medicamento? Necesitan saber si usted presenta alguno de los siguientes problemas o situaciones: Intento de suicido o ideas suicidas Trastorno bipolar Problemas de sangrado Glaucoma Enfermedad cardiaca o ataque cardiaco previo Frecuencia cardiaca irregular Enfermedad renal o heptica Niveles bajos de sodio en la sangre Una reaccin alrgica o inusual a la trazodona, a otros medicamentos, alimentos, colorantes o conservantes Si est embarazada o buscando quedar embarazada Si est amamantando a un beb Cmo debo [employer]? Tome este medicamento por va oral con un vaso de agua. Siga las instrucciones de la etiqueta del [place]. [employer] medicamento poco tiempo despus de una comida o un refrigerio ligero. Use su medicamento a intervalos regulares. No use su medicamento con una frecuencia mayor a la indicada. No deje de usar Coca[employer] de repente a menos que as lo indique su equipo de atencin. Dejar de Risk manager este medicamento demasiado rpido puede causar efectos secundarios graves o podra empeorar su afeccin. Su farmacutico le dar una Gua del medicamento especial (MedGuide, nombre en ingls) con cada receta y en cada ocasin que la vuelva a surtir. Asegrese de leer esta informacin cada vez cuidadosamente. Hable con su equipo de atencin sobre el uso de este medicamento en nios. Puede requerir atencin especial. Sobredosis: Pngase en contacto inmediatamente con un centro toxicolgico o una sala de urgencia si usted cree que haya tomado  demasiado medicamento. ATENCIN: [employer] es solo para usted. No comparta este medicamento con nadie. Qu sucede si me olvido de una dosis? Si olvida una dosis, tmela lo antes posible. Si es casi la hora de la prxima dosis, tome slo esa dosis. No tome dosis adicionales o dobles. Qu puede interactuar con este medicamento? No use este medicamento con ninguno de los siguientes productos: Ciertos medicamentos para infecciones micticas, tales como fluconazol, itraconazol, ketoconazol, posaconazol, voriconazol Cisaprida Dronedarona Linezolida IMAO, tales como [place], [place], [place], [place] y Parnate Mesoridazina Azul de metileno (inyectado en una vena) Pimozida Saquinavir Tioridazina Este medicamento tambin podra Counselling psychologist con los siguientes productos: Alcohol Medicamentos antivirales para el VIH o SIDA Aspirina y otros medicamentos tipo aspirina Barbitricos, como fenobarbital Ciertos medicamentos para la presin arterial, enfermedad cardiaca y frecuencia cardiaca irregular Ciertos medicamentos para la depresin, ansiedad o trastornos psicticos Ciertos medicamentos para la migraa, tales como almotriptn, eletriptn, frovatriptn, naratriptn, rizatriptn, sumatriptn, zolmitriptn Ciertos medicamentos para convulsiones, tales como carbamazepina y fenitona Ciertos medicamentos para dormir Ciertos medicamentos que tratan o previenen cogulos sanguneos, tales como dalteparina, enoxaparina, warfarina Digoxina Fentanilo Litio AINE, medicamentos para Conservation officer, historic buildings y la inflamacin, tales como ibuprofeno o naproxeno Otros medicamentos que prolongan el intervalo QT (causan un ritmo cardiaco anormal) como dofetilida Rasagilina Suplementos tales como hierba de [place], kava kava y valeriana Tramadol Triptfano Puede ser que esta lista no menciona todas las posibles interacciones. Informe a su profesional de [employer]	[place] de [employer] productos a base de hierbas, medicamentos de [place] o suplementos  nutritivos que est tomando. Si usted fuma, consume bebidas alcohlicas o si utiliza drogas ilegales, indqueselo tambin a su profesional de [employer]	[place]. Algunas sustancias pueden interactuar con su  medicamento. A qu debo estar atento al usar Coca-Cola? Si los sntomas no mejoran o si empeoran, consulte con su equipo de atencin. Visite a su equipo de atencin para que revise su evolucin peridicamente. Debido a que podran necesitarse varias semanas para ver los efectos completos de este medicamento, es importante que contine con el tratamiento segn las indicaciones de su equipo de atencin. Preste atencin para Product manager aparicin o el empeoramiento de la depresin o las ideas suicidas. Esto incluye cambios repentinos en el estado de nimo, comportamientos o pensamientos. Estos cambios pueden suceder en cualquier momento, pero son ms frecuentes durante el inicio del tratamiento o despus de un cambio en la dosis. Llame a su equipo de atencin de inmediato si tiene estos pensamientos o empeora su depresin. Podran producirse episodios manacos en pacientes con trastorno bipolar que [employer]. Es necesario estar atento ante [employer] o comportamientos, tales como sentirse ansioso, [place], agitado, con pnico, irritable, hostil, agresivo, impulsivo, muy inquieto, excitado en exceso e hiperactivo, o tener dificultad para dormir. Estos cambios pueden suceder en cualquier momento, pero son ms frecuentes durante el inicio del tratamiento o despus de un cambio en la dosis. Llame a su equipo de atencin de inmediato si nota alguno de estos sntomas. Puede experimentar somnolencia o mareos. No conduzca, no utilice maquinaria ni haga nada que Associate Professor en estado de alerta hasta que sepa cmo le afecta este medicamento. No se siente ni se ponga de pie con rapidez, especialmente si es un paciente de edad avanzada. Esto reduce el riesgo de mareos o [employer]. El alcohol  podra interferir con el efecto de este medicamento. Evite consumir bebidas alcohlicas. Este medicamento puede resecarle los ojos y provocar visin borrosa. Si [place] lentes de contacto, puede sentir ciertas molestias. Las gotas lubricantes pueden ser tiles. Si el problema no desaparece o es grave, consulte a su mdico de los ojos. Se le podra secar la boca. Masticar chicle sin azcar o chupar caramelos duros y beber agua en abundancia podra ser de [place]. Si el problema no desaparece o es grave, consulte a su equipo de atencin. Qu efectos secundarios puedo tener al [employer] este medicamento? Efectos secundarios que debe informar a su equipo de atencin tan pronto como sea posible: Reacciones alrgicas: erupcin cutnea, comezn/picazn, urticaria, hinchazn de la cara, los labios, la lengua o la garganta Sangrado: heces con [place], o de color negro y aspecto alquitranado, orina de color rojo o marrn oscuro, vomitar sangre o material marrn que tiene el aspecto de posos (residuos) de caf, pequeas manchas rojas o moradas en la piel, sangrado o moretones inusuales Cambios en el ritmo cardiaco: frecuencia cardiaca rpida o irregular, mareos, sensacin de desmayo o aturdimiento, Tourist information centre manager, dificultad para respirar Presin sangunea baja: mareo, sensacin de desmayo o aturdimiento, visin borrosa Nivel bajo de sodio: debilidad muscular, fatiga, mareos, dolor de cabeza, confusin Ereccin prolongada o dolorosa Sndrome serotoninrgico (de la serotonina): irritabilidad, confusin, frecuencia cardiaca rpida o irregular, rigidez muscular, tics musculares, sudoracin, fiebre alta, convulsiones, escalofros, vmito, diarrea Dolor repentino en los ojos o cambios en la visin tales como visin borrosa, ver halos alrededor [employer], prdida de visin Ideas suicidas o de autolesionarse, empeoramiento del [place] de nimo, sensacin de depresin Efectos secundarios que generalmente no requieren atencin  mdica (debe informarlos a su equipo de atencin si persisten o si son molestos): Cambios en el deseo o desempeo sexual Estreimiento Engineer, petroleum seca Puede ser que [place]  no menciona todos los posibles efectos secundarios. Comunquese a su mdico por asesoramiento mdico [employer] Inc. Usted puede informar los efectos secundarios a la FDA por telfono al 1-800-FDA-1088. Dnde debo guardar mi medicina? Mantenga fuera del alcance de nios y Copy. Guarde a [employer], entre 15 y 25 grados Celsius (43 y 36 grados Fahrenheit). Proteja de Naval architect. [place]. Deseche todo el medicamento que no haya utilizado despus de la fecha de vencimiento. ATENCIN: Este folleto es un resumen. Puede ser que no cubra toda la posible informacin. Si usted tiene preguntas acerca de esta medicina, consulte con su mdico, su farmacutico o su profesional de Technical sales engineer.  2022 Elsevier/Gold Standard (2020-11-24 00:00:00)    Insomnio Insomnia El insomnio es un trastorno del sueo que causa dificultades para conciliar el sueo o para mantenerlo. Puede producir fatiga, falta de energa, dificultad para concentrarse, cambios en el estado de nimo y mal rendimiento escolar o laboral. Hay tres formas diferentes de clasificar el insomnio: Dificultad para conciliar el sueo. Dificultad para mantener el sueo. Despertar muy precoz por la maana. Cualquier tipo de insomnio puede ser a Barrister's clerk (crnico) o a Control and instrumentation engineer (agudo). Ambos son frecuentes. Generalmente, el insomnio a corto plazo dura tres meses o menos tiempo. El crnico ocurre al menos tres veces por semana durante ms de tres meses. Cules son las causas? El insomnio puede deberse a otra afeccin, situacin o sustancia, por ejemplo: Ansiedad. Ciertos medicamentos. Enfermedad de reflujo gastroesofgico (ERGE) u otras enfermedades gastrointestinales. Asma y otras enfermedades  respiratorias. Sndrome de las piernas inquietas, apnea del sueo u otros trastornos del sueo. Dolor crnico. Menopausia. Accidente cerebrovascular. Consumo excesivo de alcohol, tabaco u drogas ilegales. Afecciones de salud mental, como depresin. Cafena. Trastornos neurolgicos, como enfermedad de Alzheimer. Hiperactividad de la glndula tiroidea (hipertiroidismo). En ocasiones, la causa del insomnio puede ser desconocida. Qu incrementa el riesgo? Los factores de riesgo de tener insomnio incluyen lo siguiente: Sexo. La enfermedad afecta ms a menudo a las mujeres que a los hombres. La edad. El insomnio es ms frecuente a medida que una persona envejece. Estrs. Falta de actividad fsica. Los horarios de trabajo irregulares o los turnos nocturnos. Los viajes a lugares de diferentes zonas horarias. Ciertas afecciones mdicas y de salud mental. Cules son los signos o sntomas? Si tiene insomnio, el sntoma principal es la dificultad para conciliar el sueo o mantenerlo. Esto puede derivar en otros sntomas, por ejemplo: Sentirse fatigado o tener poca energa. Ponerse nervioso por [employer] irse a dormir. No sentirse descansado por la maana. Tener dificultad para concentrarse. Sentirse irritable, ansioso o deprimido. Cmo se diagnostica? Esta afeccin se puede diagnosticar en funcin de lo siguiente: Los sntomas y los antecedentes mdicos. El mdico puede hacerle preguntas sobre: Hbitos de sueo. Cualquier afeccin mdica que tenga. La salud mental. Un examen fsico. Cmo se trata? El tratamiento para el insomnio depende de la causa. El tratamiento puede centrarse en tratar Ardelia Mems afeccin preexistente que causa el insomnio. El tratamiento tambin puede incluir lo siguiente: Medicamentos que lo ayuden a dormir. Asesoramiento psicolgico o terapia. Ajustes en el estilo de vida para ayudarlo a dormir mejor. Siga estas instrucciones en su casa: Comida y bebida  Limite o  evite el consumo de alcohol, bebidas con cafena y cigarrillos, especialmente cerca de la hora de [place], ya que pueden perturbarle el sueo. No consuma una comida suculenta ni coma alimentos condimentados justo antes de la hora de [place]. Esto puede causarle molestias digestivas y  dificultades para dormir. Hbitos de sueo  Lleve un registro del sueo ya que podra ser de utilidad para que usted y a su mdico puedan determinar qu podra estar causndole insomnio. Escriba los siguientes datos: Cundo duerme. Cundo se despierta durante la noche. Qu tan bien duerme. Qu tan relajado se siente al da siguiente. Cualquier efecto secundario de los medicamentos que toma. Lo que usted come y bebe. Convierta su habitacin en un lugar oscuro, cmodo donde sea fcil conciliar el sueo. Coloque persianas o cortinas oscuras que impidan la entrada de la luz del exterior. Para bloquear los ruidos, use un aparato que reproduzca sonidos ambientales o relajantes de fondo. Mantenga baja la temperatura. Limite el uso de pantallas antes de la hora de [place]. Esto incluye: Watching TV. Usar el telfono inteligente, la tableta o la computadora. Siga una rutina que incluya ir a dormir y Clinical cytogeneticist a la misma [place] y noche. Esto puede ayudarlo a conciliar el sueo ms rpidamente. Considere realizar [employer] tranquila, como leer, e incorporarla como parte de la rutina a la hora de irse a dormir. Trate de evitar tomar siestas durante el da para que pueda dormir mejor por la noche. Levntese de la cama si sigue despierto despus de 15 minutos de haber intentado dormirse. [place]erson luces, pero intente leer o hacer una actividad tranquila. Cuando tenga sueo, regrese a Futures trader. Instrucciones generales Use los medicamentos de venta libre y los recetados solamente como se lo haya indicado el mdico. Realice ejercicio con regularidad como se lo haya indicado el mdico. Evite la actividad  fsica desde varias horas antes de irse a dormir. Utilice tcnicas de relajacin para controlar el estrs. Pdale al mdico que le sugiera algunas tcnicas que sean adecuadas para usted. Pueden incluir: Ejercicios de respiracin. Rutinas para aliviar la tensin muscular. Visualizacin de escenas apacibles. Conduzca con cuidado. No conduzca si est muy somnoliento. Concurra a todas las visitas de seguimiento como se lo haya indicado el mdico. Esto es importante. Comunquese con un mdico si: Est cansado durante todo Games developer. Tiene dificultad en su rutina diaria debido a la somnolencia. Sigue teniendo problemas para dormir o Press photographer. Solicite ayuda de inmediato si: Piensa seriamente en lastimarse a usted mismo o a Nurse, children's. Si alguna vez siente que puede lastimarse o Market researcher a Producer, television/film/video, o tiene pensamientos de poner fin a su vida, busque ayuda de inmediato. Puede dirigirse al servicio de emergencias ms cercano o comunicarse con: Servicio de emergencias de su localidad (911 en EE. UU.). Una lnea de asistencia al suicida y atencin en crisis como National Suicide Prevention Lifeline ([place]), al ([phone])[phone]. Est disponible las 24 horas del da. Resumen El insomnio es un trastorno del sueo que causa dificultades para conciliar el sueo o para [place]. El insomnio puede ser a Barrister's clerk (crnico) o a Control and instrumentation engineer (agudo). El tratamiento para el insomnio depende de la causa. El tratamiento puede centrarse en tratar Ardelia Mems afeccin preexistente que causa el insomnio. Lleve un registro del sueo ya que podra ser de utilidad para que usted y a su mdico puedan determinar qu podra estar causndole insomnio. Esta informacin no tiene Marine scientist el consejo del mdico. Asegrese de hacerle al mdico cualquier pregunta que tenga. Document Revised: 06/30/2020 Document Reviewed: 06/30/2020 Elsevier Patient Education  2022 [employer].

## 2021-06-15 ENCOUNTER — Ambulatory Visit
Admission: RE | Admit: 2021-06-15 | Discharge: 2021-06-15 | Disposition: A | Discharge status: Home / Self Care | Payer: Medicare Other | Source: Ambulatory Visit | Attending: Radiation Oncology | Admitting: Radiation Oncology

## 2021-06-15 ENCOUNTER — Telehealth: Payer: Self-pay | Admitting: *Deleted

## 2021-06-15 NOTE — Telephone Encounter (Signed)
RETURNED PATIENT'S PHONE CALL, FU APPT. RESCHEDULED FOR 06-18-21 @ 4 PM, PATIENT AGREED TO DAY AND TIME

## 2021-06-17 NOTE — Progress Notes (Signed)
Radiation Oncology         [phone] ________________________________  Name: Julie Orozco MRN: [id number]  Date: 06/18/2021  DOB: 1946-04-27  Follow-Up Visit Note  CC: Girtha Rm, PA-C  Henson, Vickie L, PA-C    ICD-10-CM   1. Endometrial adenocarcinoma (HCC)  C54.1       Diagnosis: Stage III endometrioid adenocarcinoma (pT1a, pN35mi, M0)  Interval Since Last Radiation: 6 months, and 8 days   Radiation treatment dates:    IMRT: 10/15/20 - 11/18/20 HDR: 3/31, 4/5, 12/09/20   Site/dose:    IMRT: Pelvis / 45 Gy in 25 Gy HDR: Vaginal Cylinder, 2.5 cm / 18 Gy in 3 fractions   Beams/energy:    IMRT: 6X HDR: Ir-192, 3.0 cm length, 1 channel and 7 dwells   Narrative:  The patient returns today for routine follow-up. She is accompanied by her her daughter who serves as the interpreter.  Since her last visit, the patient met with Dr. Berline Lopes on 03/09/21 for follow-up. During which time, the patient reported complete resolution of her symptoms from radiation treatment, though did report some lower extremity pain to her knees and lower legs, defined as constant, achy, and worse at night. Physical exam revealed some tenderness along her IT bands, noted as likely musculoskeletal in nature. Dr. Berline Lopes referred the patient to PT for this. Otherwise, the patient was NED on exam.         Of note: the patient is taking Gabapentin PRN for leg pain.  The patient did see her primary care physician for pain in the pelvis after direct Dr. Charisse March follow-up.  Patient was given hydrocodone but this pain is essentially resolved at this time.  She occasionally will take a hydrocodone approximately every 2 to 3 days.  She denies any vaginal bleeding or problems with diarrhea.  She denies any problems with bloating.  She denies any hematuria or rectal bleeding.                    Allergies:  has No Known Allergies.  Meds: Current Outpatient Medications  Medication Sig Dispense Refill    acetaminophen (TYLENOL) 500 MG tablet Take 500-1,000 mg by mouth every 6 (six) hours as needed (pain.).     Cholecalciferol (VITAMIN D3) 25 MCG (1000 UT) CAPS Take 1,000 Units by mouth daily.     ibuprofen (ADVIL) 200 MG tablet Take 600 mg by mouth every 8 (eight) hours as needed (pain.).     lisinopril (ZESTRIL) 10 MG tablet Take 1 tablet by mouth once daily 90 tablet 0   metFORMIN (GLUCOPHAGE) 500 MG tablet Take 1 tablet by mouth once daily with breakfast 90 tablet 0   simvastatin (ZOCOR) 20 MG tablet Take 1 tablet (20 mg total) by mouth at bedtime. 90 tablet 0   traZODone (DESYREL) 50 MG tablet Take 0.5 tablets (25 mg total) by mouth at bedtime as needed for sleep. 30 tablet 1   gabapentin (NEURONTIN) 100 MG capsule Take 1 capsule (100 mg total) by mouth at bedtime. (Patient not taking: Reported on 06/18/2021) 30 capsule 1   No current facility-administered medications for this encounter.    Physical Findings: The patient is in no acute distress. Patient is alert and oriented.  weight is 169 lb 9.6 oz (76.9 kg). Her temperature is 97.8 F (36.6 C). Her blood pressure is 124/64 and her pulse is 67. Her respiration is 18 and oxygen saturation is 99%. .   Lungs are clear to  auscultation bilaterally. Heart has regular rate and rhythm. No palpable cervical, supraclavicular, or axillary adenopathy. Abdomen soft, non-tender, normal bowel sounds.  On pelvic examination the external genitalia were unremarkable. A speculum exam was performed. There are no mucosal lesions noted in the vaginal vault.  Some erythema noted to the vaginal cuff area without lesion or signs of infection . On bimanual and rectovaginal examination there were no pelvic masses appreciated.  Vaginal cuff intact.  Rectal sphincter tone normal.   Lab Findings: Lab Results  Component Value Date   WBC 5.4 04/28/2021   HGB 12.7 04/28/2021   HCT 37.9 04/28/2021   MCV 92 04/28/2021   PLT 232 04/28/2021    Radiographic  Findings: No results found.  Impression:  Stage III endometrioid adenocarcinoma (pT1a, pN65mi, M0)  No evidence of recurrence on clinical exam today.  Patient does not appear to have any long-term effects from her radiation therapy at this point.  Plan: Routine follow-up in 6 months.  Prior to this follow-up appointment the patient will follow up with Dr. Berline Lopes in gynecologic oncology.   25 minutes of total time was spent for this patient encounter, including preparation, face-to-face counseling with the patient and coordination of care, physical exam, and documentation of the encounter. ____________________________________  Blair Promise, PhD, MD   This document serves as a record of services personally performed by Gery Pray, MD. It was created on his behalf by Roney Mans, a trained medical scribe. The creation of this record is based on the scribe's personal observations and the provider's statements to them. This document has been checked and approved by the attending provider.

## 2021-06-18 ENCOUNTER — Ambulatory Visit
Admission: RE | Admit: 2021-06-18 | Discharge: 2021-06-18 | Disposition: A | Discharge status: Home / Self Care | Payer: Medicare Other | Source: Ambulatory Visit | Attending: Radiation Oncology | Admitting: Radiation Oncology

## 2021-06-18 ENCOUNTER — Encounter: Payer: Self-pay | Admitting: Radiation Oncology

## 2021-06-18 ENCOUNTER — Other Ambulatory Visit: Payer: Self-pay

## 2021-06-18 DIAGNOSIS — Z08 Encounter for follow-up examination after completed treatment for malignant neoplasm: Secondary | ICD-10-CM | POA: Diagnosis not present

## 2021-06-18 DIAGNOSIS — Z923 Personal history of irradiation: Secondary | ICD-10-CM | POA: Diagnosis not present

## 2021-06-18 DIAGNOSIS — Z8542 Personal history of malignant neoplasm of other parts of uterus: Secondary | ICD-10-CM | POA: Diagnosis not present

## 2021-06-18 DIAGNOSIS — Z79899 Other long term (current) drug therapy: Secondary | ICD-10-CM | POA: Insufficient documentation

## 2021-06-18 DIAGNOSIS — C541 Malignant neoplasm of endometrium: Secondary | ICD-10-CM | POA: Diagnosis not present

## 2021-06-18 NOTE — Progress Notes (Signed)
Julie Orozco is here today for follow up post radiation to the pelvic.  They completed their radiation on: 12/09/20  Does the patient complain of any of the following:  Pain:Patient denies pain. Abdominal bloating: yes, at times Diarrhea/Constipation: no Nausea/Vomiting: no Vaginal Discharge: no Blood in Urine or Stool: no Urinary Issues (dysuria/incomplete emptying/ incontinence/ increased frequency/urgency): no Does patient report using vaginal dilator 2-3 times a week and/or sexually active 2-3 weeks: Patient using twice per month.  Post radiation skin changes: no   Additional comments if applicable:   Vitals:   06/18/21 1613  BP: 124/64  Pulse: 67  Resp: 18  Temp: 97.8 F (36.6 C)  SpO2: 99%  Weight: 169 lb 9.6 oz (76.9 kg)

## 2021-07-30 ENCOUNTER — Other Ambulatory Visit: Payer: Self-pay

## 2021-07-30 ENCOUNTER — Other Ambulatory Visit: Payer: Medicare Other

## 2021-07-30 DIAGNOSIS — I10 Essential (primary) hypertension: Secondary | ICD-10-CM

## 2021-07-30 DIAGNOSIS — R770 Abnormality of albumin: Secondary | ICD-10-CM | POA: Diagnosis not present

## 2021-07-30 DIAGNOSIS — E119 Type 2 diabetes mellitus without complications: Secondary | ICD-10-CM

## 2021-07-30 MED ORDER — LISINOPRIL 10 MG PO TABS: 10.0000 mg | ORAL_TABLET | Freq: Every day | ORAL | 0 refills | Status: DC

## 2021-07-30 MED ORDER — METFORMIN HCL 500 MG PO TABS: 500.0000 mg | ORAL_TABLET | Freq: Every day | ORAL | 0 refills | Status: DC

## 2021-07-30 MED ORDER — SIMVASTATIN 20 MG PO TABS: 20.0000 mg | ORAL_TABLET | Freq: Every day | ORAL | 0 refills | Status: DC

## 2021-07-31 LAB — MICROALBUMIN / CREATININE URINE RATIO
Creatinine, Urine: 155.8 mg/dL
Microalb/Creat Ratio: 28 mg/g creat (ref 0–29)
Microalbumin, Urine: 44.1 ug/mL

## 2021-08-11 ENCOUNTER — Telehealth: Payer: Self-pay | Admitting: *Deleted

## 2021-08-11 NOTE — Telephone Encounter (Signed)
Called patient to inform of fu appt, with Dr. Berline Lopes on 09-14-21- arrival time- 1:30 pm, spoke with patient's daughter- Elodia Florence and she is aware of this appt.

## 2021-08-14 ENCOUNTER — Ambulatory Visit (INDEPENDENT_AMBULATORY_CARE_PROVIDER_SITE_OTHER): Payer: Medicare Other

## 2021-08-14 ENCOUNTER — Other Ambulatory Visit: Payer: Self-pay

## 2021-08-14 VITALS — BP 108/70 | HR 72 | Temp 97.6°F | Ht 60.0 in | Wt 169.4 lb

## 2021-08-14 DIAGNOSIS — Z0000 Encounter for general adult medical examination without abnormal findings: Secondary | ICD-10-CM | POA: Diagnosis not present

## 2021-08-14 DIAGNOSIS — Z1231 Encounter for screening mammogram for malignant neoplasm of breast: Secondary | ICD-10-CM

## 2021-08-14 NOTE — Patient Instructions (Signed)
Julie Orozco , Thank you for taking time to come for your Medicare Wellness Visit. I appreciate your ongoing commitment to your health goals. Please review the following plan we discussed and let me know if I can assist you in the future.   Screening recommendations/referrals: Colonoscopy: completed 09/12/2019 Mammogram: ordered today Bone Density: completed 07/30/2019 Recommended yearly ophthalmology/optometry visit for glaucoma screening and checkup Recommended yearly dental visit for hygiene and checkup  Vaccinations: Influenza vaccine: completed 05/25/2021 Pneumococcal vaccine: completed 11/02/2017 Tdap vaccine: completed 08/31/2011, due 08/30/2021 Shingles vaccine: discussed   Covid-19: 08/08/2020, 12/12/2019, 11/21/2019  Advanced directives: copy in chart  Conditions/risks identified: none  Next appointment: Follow up in one year for your annual wellness visit    Preventive Care 23 Years and Older, Female Preventive care refers to lifestyle choices and visits with your health care provider that can promote health and wellness. What does preventive care include? A yearly physical exam. This is also called an annual well check. Dental exams once or twice a year. Routine eye exams. Ask your health care provider how often you should have your eyes checked. Personal lifestyle choices, including: Daily care of your teeth and gums. Regular physical activity. Eating a healthy diet. Avoiding tobacco and drug use. Limiting alcohol use. Practicing safe sex. Taking low-dose aspirin every day. Taking vitamin and mineral supplements as recommended by your health care provider. What happens during an annual well check? The services and screenings done by your health care provider during your annual well check will depend on your age, overall health, lifestyle risk factors, and family history of disease. Counseling  Your health care provider may ask you questions about your: Alcohol  use. Tobacco use. Drug use. Emotional well-being. Home and relationship well-being. Sexual activity. Eating habits. History of falls. Memory and ability to understand (cognition). Work and work Statistician. Reproductive health. Screening  You may have the following tests or measurements: Height, weight, and BMI. Blood pressure. Lipid and cholesterol levels. These may be checked every 5 years, or more frequently if you are over 78 years old. Skin check. Lung cancer screening. You may have this screening every year starting at age 66 if you have a 30-pack-year history of smoking and currently smoke or have quit within the past 15 years. Fecal occult blood test (FOBT) of the stool. You may have this test every year starting at age 32. Flexible sigmoidoscopy or colonoscopy. You may have a sigmoidoscopy every 5 years or a colonoscopy every 10 years starting at age 37. Hepatitis C blood test. Hepatitis B blood test. Sexually transmitted disease (STD) testing. Diabetes screening. This is done by checking your blood sugar (glucose) after you have not eaten for a while (fasting). You may have this done every 1-3 years. Bone density scan. This is done to screen for osteoporosis. You may have this done starting at age 53. Mammogram. This may be done every 1-2 years. Talk to your health care provider about how often you should have regular mammograms. Talk with your health care provider about your test results, treatment options, and if necessary, the need for more tests. Vaccines  Your health care provider may recommend certain vaccines, such as: Influenza vaccine. This is recommended every year. Tetanus, diphtheria, and acellular pertussis (Tdap, Td) vaccine. You may need a Td booster every 10 years. Zoster vaccine. You may need this after age 58. Pneumococcal 13-valent conjugate (PCV13) vaccine. One dose is recommended after age 78. Pneumococcal polysaccharide (PPSV23) vaccine. One dose is  recommended  after age 74. Talk to your health care provider about which screenings and vaccines you need and how often you need them. This information is not intended to replace advice given to you by your health care provider. Make sure you discuss any questions you have with your health care provider. Document Released: 09/12/2015 Document Revised: 05/05/2016 Document Reviewed: 06/17/2015 Elsevier Interactive Patient Education  2017 [place] Prevention in the Home Falls can cause injuries. They can happen to people of all ages. There are many things you can do to make your home safe and to help prevent falls. What can I do on the outside of my home? Regularly fix the edges of walkways and driveways and fix any cracks. Remove anything that might make you trip as you walk through a door, such as a raised step or threshold. Trim any bushes or trees on the path to your home. Use bright outdoor lighting. Clear any walking paths of anything that might make someone trip, such as rocks or tools. Regularly check to see if handrails are loose or broken. Make sure that both sides of any steps have handrails. Any raised decks and porches should have guardrails on the edges. Have any leaves, snow, or ice cleared regularly. Use sand or salt on walking paths during winter. Clean up any spills in your garage right away. This includes oil or grease spills. What can I do in the bathroom? Use night lights. Install grab bars by the toilet and in the tub and shower. Do not use towel bars as grab bars. Use non-skid mats or decals in the tub or shower. If you need to sit down in the shower, use a plastic, non-slip stool. Keep the floor dry. Clean up any water that spills on the floor as soon as it happens. Remove soap buildup in the tub or shower regularly. Attach bath mats securely with double-sided non-slip rug tape. Do not have throw rugs and other things on the floor that can make you  trip. What can I do in the bedroom? Use night lights. Make sure that you have a light by your bed that is easy to reach. Do not use any sheets or blankets that are too big for your bed. They should not hang down onto the floor. Have a firm chair that has side arms. You can use this for support while you get dressed. Do not have throw rugs and other things on the floor that can make you trip. What can I do in the kitchen? Clean up any spills right away. Avoid walking on wet floors. Keep items that you use a lot in easy-to-reach places. If you need to reach something above you, use a strong step stool that has a grab bar. Keep electrical cords out of the way. Do not use floor polish or wax that makes floors slippery. If you must use wax, use non-skid floor wax. Do not have throw rugs and other things on the floor that can make you trip. What can I do with my stairs? Do not leave any items on the stairs. Make sure that there are handrails on both sides of the stairs and use them. Fix handrails that are broken or loose. Make sure that handrails are as long as the stairways. Check any carpeting to make sure that it is firmly attached to the stairs. Fix any carpet that is loose or worn. Avoid having throw rugs at the top or bottom of the stairs. If you do  have throw rugs, attach them to the floor with carpet tape. Make sure that you have a light switch at the top of the stairs and the bottom of the stairs. If you do not have them, ask someone to add them for you. What else can I do to help prevent falls? Wear shoes that: Do not have high heels. Have rubber bottoms. Are comfortable and fit you well. Are closed at the toe. Do not wear sandals. If you use a stepladder: Make sure that it is fully opened. Do not climb a closed stepladder. Make sure that both sides of the stepladder are locked into place. Ask someone to hold it for you, if possible. Clearly mark and make sure that you can  see: Any grab bars or handrails. First and last steps. Where the edge of each step is. Use tools that help you move around (mobility aids) if they are needed. These include: Canes. Walkers. Scooters. Crutches. Turn on the lights when you go into a dark area. Replace any light bulbs as soon as they burn out. Set up your furniture so you have a clear path. Avoid moving your furniture around. If any of your floors are uneven, fix them. If there are any pets around you, be aware of where they are. Review your medicines with your doctor. Some medicines can make you feel dizzy. This can increase your chance of falling. Ask your doctor what other things that you can do to help prevent falls. This information is not intended to replace advice given to you by your health care provider. Make sure you discuss any questions you have with your health care provider. Document Released: 06/12/2009 Document Revised: 01/22/2016 Document Reviewed: 09/20/2014 Elsevier Interactive Patient Education  2017 [employer].

## 2021-08-14 NOTE — Progress Notes (Addendum)
This visit occurred during the SARS-CoV-2 public health emergency.  Safety protocols were in place, including screening questions prior to the visit, additional usage of staff PPE, and extensive cleaning of exam room while observing appropriate contact time as indicated for disinfecting solutions.  Subjective:   Julie Orozco is a 75 y.o. female who presents for Medicare Annual (Subsequent) preventive examination.  Review of Systems     Cardiac Risk Factors include: advanced age (>56men, >1 women);diabetes mellitus;hypertension;obesity (BMI >30kg/m2)     Objective:    Today's Vitals   08/14/21 0907  BP: 108/70  Pulse: 72  Temp: 97.6 F (36.4 C)  TempSrc: Oral  SpO2: 96%  Weight: 169 lb 6.4 oz (76.8 kg)  Height: 5' (1.524 m)   Body mass index is 33.08 kg/m.  Advanced Directives 08/14/2021 06/18/2021 01/12/2021 11/25/2020 09/17/2020 08/18/2020 08/07/2020  Does Patient Have a Medical Advance Directive? Yes No No No No No No  Type of Paramedic of [place];Living will - - - - - -  Does patient want to make changes to medical advance directive? - - - - - - -  Copy of [place] in Chart? Yes - validated most recent copy scanned in chart (See row information) - - - - - -  Would patient like information on creating a medical advance directive? - No - Patient declined No - Patient declined No - Patient declined No - Patient declined - -  Pre-existing out of facility DNR order (yellow form or pink MOST form) - - - - - - -    Current Medications (verified) Outpatient Encounter Medications as of 08/14/2021  Medication Sig   acetaminophen (TYLENOL) 500 MG tablet Take 500-1,000 mg by mouth every 6 (six) hours as needed (pain.).   Cholecalciferol (VITAMIN D3) 25 MCG (1000 UT) CAPS Take 1,000 Units by mouth daily.   ibuprofen (ADVIL) 200 MG tablet Take 600 mg by mouth every 8 (eight) hours as needed (pain.).   lisinopril (ZESTRIL) 10 MG  tablet Take 1 tablet (10 mg total) by mouth daily.   metFORMIN (GLUCOPHAGE) 500 MG tablet Take 1 tablet (500 mg total) by mouth daily with breakfast.   simvastatin (ZOCOR) 20 MG tablet Take 1 tablet (20 mg total) by mouth at bedtime.   traZODone (DESYREL) 50 MG tablet Take 0.5 tablets (25 mg total) by mouth at bedtime as needed for sleep.   gabapentin (NEURONTIN) 100 MG capsule Take 1 capsule (100 mg total) by mouth at bedtime. (Patient not taking: Reported on 06/18/2021)   No facility-administered encounter medications on file as of 08/14/2021.    Allergies (verified) Patient has no known allergies.   History: Past Medical History:  Diagnosis Date   DM type 2 (diabetes mellitus, type 2) ([place])    Elevated ALT measurement 04/18/2018   resolved   Endometrial cancer [facility])    History of radiation therapy 10/15/20-11/18/20   IMRT endometrium - Dr. Gery Pray   History of radiation therapy 11/27/2020, 12/02/2020, 12/09/2020   vaginal brachytherapy - [facility]DR to endometrium      Dr Gery Pray   Hyperlipidemia    Hypertension    Obesity    Osteopenia determined by x-ray    PMB (postmenopausal bleeding)    SVD (spontaneous vaginal delivery)    x 8   Past Surgical History:  Procedure Laterality Date   DILATATION & CURETTAGE/HYSTEROSCOPY WITH MYOSURE N/A 07/18/2020   Procedure: [place];  Surgeon: Princess Bruins, MD;  Location: [facility];  Service: Gynecology;  Laterality: N/A;   DILATION AND CURETTAGE OF UTERUS N/A 01/25/2014   Procedure: DILATATION AND CURETTAGE;  Surgeon: Emily Filbert, MD;  Location: [facility];  Service: Gynecology;  Laterality: N/A;   MULTIPLE TOOTH EXTRACTIONS  yrs ago   ROBOTIC ASSISTED TOTAL HYSTERECTOMY WITH BILATERAL SALPINGO OOPHERECTOMY N/A 08/27/2020   Procedure: XI ROBOTIC ASSISTED TOTAL HYSTERECTOMY WITH BILATERAL SALPINGO OOPHORECTOMY,;  Surgeon: Lafonda Mosses, MD;  Location: [facility];  Service:  Gynecology;  Laterality: N/A;   SENTINEL NODE BIOPSY N/A 08/27/2020   Procedure: SENTINEL LYMPH NODE BIOPSY;  Surgeon: Lafonda Mosses, MD;  Location: [facility];  Service: Gynecology;  Laterality: N/A;   TUBAL LIGATION  yrs ago   Family History  Problem Relation Age of Onset   Hypertension Mother    Alcohol abuse Father    Anemia Brother    Alcohol abuse Brother    Breast cancer Cousin        maternal cousin   Colon cancer Neg Hx    Esophageal cancer Neg Hx    Rectal cancer Neg Hx    Stomach cancer Neg Hx    Ovarian cancer Neg Hx    Social History   Socioeconomic History   Marital status: Divorced    Spouse name: Not on file   Number of children: Not on file   Years of education: Not on file   Highest education level: Not on file  Occupational History   Not on file  Tobacco Use   Smoking status: Never   Smokeless tobacco: Never  Vaping Use   Vaping Use: Never used  Substance and Sexual Activity   Alcohol use: No   Drug use: No   Sexual activity: Not Currently    Birth control/protection: Post-menopausal  Other Topics Concern   Not on file  Social History Narrative   Not on file   Social Determinants of Health   Financial Resource Strain: Low Risk    Difficulty of Paying Living Expenses: Not hard at all  Food Insecurity: No Food Insecurity   Worried About Charity fundraiser in the Last Year: Never true   [place] in the Last Year: Never true  Transportation Needs: No Transportation Needs   Lack of Transportation (Medical): No   Lack of Transportation (Non-Medical): No  Physical Activity: Insufficiently Active   Days of Exercise per Week: 5 days   Minutes of Exercise per Session: 20 min  Stress: No Stress Concern Present   Feeling of Stress : Not at all  Social Connections: Not on file    Tobacco Counseling Counseling given: Not Answered   Clinical Intake:  Pre-visit preparation completed: Yes  Pain : No/denies pain     Nutritional  Status: BMI > 30  Obese Nutritional Risks: None Diabetes: Yes  How often do you need to have someone help you when you read instructions, pamphlets, or other written materials from your doctor or pharmacy?: 1 - Never What is the last grade level you completed in school?: 6th grade  Diabetic? Yes Nutrition Risk Assessment:  Has the patient had any N/V/D within the last 2 months?  No  Does the patient have any non-healing wounds?  No  Has the patient had any unintentional weight loss or weight gain?  No   Diabetes:  Is the patient diabetic?  Yes  If diabetic, was a CBG obtained today?  No  Did the patient bring in  their glucometer from home?  No  How often do you monitor your CBG's? Does not.   Financial Strains and Diabetes Management:  Are you having any financial strains with the device, your supplies or your medication? No .  Does the patient want to be seen by Chronic Care Management for management of their diabetes?  No  Would the patient like to be referred to a Nutritionist or for Diabetic Management?  No   Diabetic Exams:  Diabetic Eye Exam: Overdue for diabetic eye exam. Pt has been advised about the importance in completing this exam. Patient advised to call and schedule an eye exam. Diabetic Foot Exam: Overdue, Pt has been advised about the importance in completing this exam. Pt is scheduled for diabetic foot exam on next appointment.   Interpreter Needed?: No  Information entered by :: NAllen LPN   Activities of Daily Living In your present state of health, do you have any difficulty performing the following activities: 08/14/2021 08/18/2020  Hearing? N N  Vision? N N  Difficulty concentrating or making decisions? N N  Walking or climbing stairs? N N  Dressing or bathing? N N  Doing errands, shopping? Y N  Preparing Food and eating ? N -  Using the Toilet? N -  In the past six months, have you accidently leaked urine? N -  Do you have problems with loss of  bowel control? N -  Managing your Medications? N -  Managing your Finances? N -  Housekeeping or managing your Housekeeping? N -  Some recent data might be hidden    Patient Care Team: Davy Pique as PCP - General (Family Medicine)  Indicate any recent Medical Services you may have received from other than [facility] providers in the past year (date may be approximate).     Assessment:   This is a routine wellness examination for [place].  Hearing/Vision screen Vision Screening - Comments:: No regular eye exams  Dietary issues and exercise activities discussed: Current Exercise Habits: Home exercise routine, Type of exercise: walking, Time (Minutes): 20, Frequency (Times/Week): 5, Weekly Exercise (Minutes/Week): 100   Goals Addressed             This Visit's Progress    Patient Stated       08/14/2021, Stay healthy       Depression Screen PHQ 2/9 Scores 08/14/2021 05/25/2021 06/05/2020 05/30/2019 04/17/2018 06/14/2016  PHQ - 2 Score 0 0 0 0 0 0    Fall Risk Fall Risk  08/14/2021 05/25/2021 06/05/2020 05/30/2019 04/17/2018  Falls in the past year? 0 0 0 0 No  Number falls in past yr: - 0 0 0 -  Injury with Fall? - 0 0 0 -  Risk for fall due to : Medication side effect No Fall Risks - - -  Follow up Falls evaluation completed;Education provided;Falls prevention discussed Falls evaluation completed - - -    FALL RISK PREVENTION PERTAINING TO THE HOME:  Any stairs in or around the home? Yes  If so, are there any without handrails? No  Home free of loose throw rugs in walkways, pet beds, electrical cords, etc? Yes  Adequate lighting in your home to reduce risk of falls? Yes   ASSISTIVE DEVICES UTILIZED TO PREVENT FALLS:  Life alert? No  Use of a cane, walker or w/c? No  Grab bars in the bathroom? No  Shower chair or bench in shower? No  Elevated toilet seat or a handicapped toilet? Yes  TIMED UP AND GO:  Was the test performed? No .    Gait slow and  steady without use of assistive device  Cognitive Function:        Immunizations Immunization History  Administered Date(s) Administered   Fluad Quad(high Dose 65+) 06/05/2020, 05/25/2021   Influenza, High Dose Seasonal PF 06/02/2016, 05/25/2017, 08/08/2018   PFIZER(Purple Top)SARS-COV-2 Vaccination 11/21/2019, 12/12/2019, 08/08/2020   Pneumococcal Conjugate-13 06/14/2016   Pneumococcal Polysaccharide-23 11/02/2017   Tdap 08/31/2011    TDAP status: Up to date  Flu Vaccine status: Up to date  Pneumococcal vaccine status: Up to date  Covid-19 vaccine status: Completed vaccines  Qualifies for Shingles Vaccine? Yes   Zostavax completed No   Shingrix Completed?: No.    Education has been provided regarding the importance of this vaccine. Patient has been advised to call insurance company to determine out of pocket expense if they have not yet received this vaccine. Advised may also receive vaccine at local pharmacy or Health Dept. Verbalized acceptance and understanding.  Screening Tests Health Maintenance  Topic Date Due   Zoster Vaccines- Shingrix (1 of 2) Never done   OPHTHALMOLOGY EXAM  11/01/2020   FOOT EXAM  06/05/2021   MAMMOGRAM  07/29/2021   COVID-19 Vaccine (4 - Booster for Pfizer series) 08/30/2021 (Originally 10/03/2020)   TETANUS/TDAP  08/30/2021   HEMOGLOBIN A1C  10/27/2021   COLONOSCOPY (Pts 45-13yrs Insurance coverage will need to be confirmed)  09/11/2022   Pneumonia Vaccine 4+ Years old  Completed   INFLUENZA VACCINE  Completed   DEXA SCAN  Completed   Hepatitis C Screening  Completed   HPV VACCINES  Aged Out    Health Maintenance  Health Maintenance Due  Topic Date Due   Zoster Vaccines- Shingrix (1 of 2) Never done   OPHTHALMOLOGY EXAM  11/01/2020   FOOT EXAM  06/05/2021   MAMMOGRAM  07/29/2021    Colorectal cancer screening: Type of screening: Colonoscopy. Completed 09/12/2019. Repeat every 3 years  Mammogram status: Completed 07/30/2019.  Repeat every year  Bone Density status: Completed 07/30/2019.   Lung Cancer Screening: (Low Dose CT Chest recommended if Age 79-80 years, 30 pack-year currently smoking OR have quit w/in 15years.) does not qualify.   Lung Cancer Screening Referral: no  Additional Screening:  Hepatitis C Screening: does qualify; Completed 04/17/2018  Vision Screening: Recommended annual ophthalmology exams for early detection of glaucoma and other disorders of the eye. Is the patient up to date with their annual eye exam?  No  Who is the provider or what is the name of the office in which the patient attends annual eye exams? none If pt is not established with a provider, would they like to be referred to a provider to establish care? No .   Dental Screening: Recommended annual dental exams for proper oral hygiene  Community Resource Referral / Chronic Care Management: CRR required this visit?  No   CCM required this visit?  No      Plan:     I have personally reviewed and noted the following in the patients chart:   Medical and social history Use of alcohol, tobacco or illicit drugs  Current medications and supplements including opioid prescriptions.  Functional ability and status Nutritional status Physical activity Advanced directives List of other physicians Hospitalizations, surgeries, and ER visits in previous 12 months Vitals Screenings to include cognitive, depression, and falls Referrals and appointments  In addition, I have reviewed and discussed with patient certain preventive protocols, quality  metrics, and best practice recommendations. A written personalized care plan for preventive services as well as general preventive health recommendations were provided to patient.     Kellie Simmering, LPN   [id number]   Nurse Notes: 6 CIT not administered due to language barrier.

## 2021-09-08 ENCOUNTER — Encounter: Payer: Self-pay | Admitting: Gynecologic Oncology

## 2021-09-14 ENCOUNTER — Encounter: Payer: Self-pay | Admitting: Gynecologic Oncology

## 2021-09-14 ENCOUNTER — Inpatient Hospital Stay: Payer: Medicare Other | Attending: Gynecologic Oncology | Admitting: Gynecologic Oncology

## 2021-09-14 ENCOUNTER — Other Ambulatory Visit: Payer: Self-pay

## 2021-09-14 VITALS — BP 127/73 | HR 69 | Temp 97.6°F | Resp 18 | Ht 60.24 in | Wt 164.4 lb

## 2021-09-14 DIAGNOSIS — N393 Stress incontinence (female) (male): Secondary | ICD-10-CM | POA: Insufficient documentation

## 2021-09-14 DIAGNOSIS — Z90722 Acquired absence of ovaries, bilateral: Secondary | ICD-10-CM | POA: Diagnosis not present

## 2021-09-14 DIAGNOSIS — Z90710 Acquired absence of both cervix and uterus: Secondary | ICD-10-CM | POA: Insufficient documentation

## 2021-09-14 DIAGNOSIS — Z8542 Personal history of malignant neoplasm of other parts of uterus: Secondary | ICD-10-CM | POA: Diagnosis not present

## 2021-09-14 DIAGNOSIS — Z79899 Other long term (current) drug therapy: Secondary | ICD-10-CM | POA: Diagnosis not present

## 2021-09-14 DIAGNOSIS — Z923 Personal history of irradiation: Secondary | ICD-10-CM | POA: Insufficient documentation

## 2021-09-14 DIAGNOSIS — Z6831 Body mass index (BMI) 31.0-31.9, adult: Secondary | ICD-10-CM | POA: Diagnosis not present

## 2021-09-14 DIAGNOSIS — E785 Hyperlipidemia, unspecified: Secondary | ICD-10-CM | POA: Insufficient documentation

## 2021-09-14 DIAGNOSIS — I10 Essential (primary) hypertension: Secondary | ICD-10-CM | POA: Insufficient documentation

## 2021-09-14 DIAGNOSIS — E669 Obesity, unspecified: Secondary | ICD-10-CM | POA: Insufficient documentation

## 2021-09-14 DIAGNOSIS — M8580 Other specified disorders of bone density and structure, unspecified site: Secondary | ICD-10-CM | POA: Insufficient documentation

## 2021-09-14 DIAGNOSIS — E119 Type 2 diabetes mellitus without complications: Secondary | ICD-10-CM | POA: Insufficient documentation

## 2021-09-14 DIAGNOSIS — Z7984 Long term (current) use of oral hypoglycemic drugs: Secondary | ICD-10-CM | POA: Insufficient documentation

## 2021-09-14 DIAGNOSIS — C541 Malignant neoplasm of endometrium: Secondary | ICD-10-CM

## 2021-09-14 NOTE — Patient Instructions (Signed)
It was good to see you today.  I do not see or feel any evidence of cancer recurrence on your exam.  I will see you back for a visit in 6 months.  If anything changes before then (vaginal bleeding, pelvic pain, change to bowel movements, unintentional weight loss), please call to see me sooner.  Otherwise, please call back in May or June to schedule a visit with me in July.  I am sending a referral for the pelvic floor physical therapist.  I think this could be helpful for your symptoms of leaking with movement.

## 2021-09-14 NOTE — Progress Notes (Signed)
Gynecologic Oncology Return Clinic Visit  09/14/2021  Reason for Visit: surveillance visit in the setting of advanced stage uterine cancer  Treatment History: Oncology History Overview Note  IHC - loss of MLH1 and PMS2 MLH1 promoter hypermethylation present   Endometrial adenocarcinoma ([place])  07/18/2020 Surgery   D&C: grade 1 EMCA    Initial Diagnosis   Endometrial adenocarcinoma ([place])   08/27/2020 Surgery   Robotic-assisted laparoscopic total hysterectomy with bilateral salpingoophorectomy, SLN biopsy    08/27/2020 Pathology Results   A. SENTINEL LYMPH NODE, RIGHT OBTURATOR, BIOPSY:  - Lymph node, negative for carcinoma (0/1)   B. SENTINEL LYMPH NODE, RIGHT EXTERNAL ILIAC, BIOPSY:  - Lymph node, negative for carcinoma (0/1)   C. 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 UTERUS, CERVIX, BILATERAL TUBES AND OVARIES:  - Endometrioid adenocarcinoma, FIGO grade 2, see comment  - Carcinoma invades for a depth of 0.6 cm with a myometrial thickness is  2.0 cm  - Benign unremarkable cervix  - Benign bilateral fallopian tubes and ovaries  - See oncology table    COMMENT:   C.   Immunostain for pancytokeratin (AE1/AE3) highlights focus of  metastatic carcinoma.   D.  Immunohistochemical stains show that the tumor cells show wild-type  (normal) expression pattern for p53 and are negative for p16, consistent  with above interpretation.  The tumor shows 2 different morphologies  with the majority tumor showing a FIGO grade 1 endometrial  adenocarcinoma and a second component with FIGO grade 2 features.  Dr.  Tresa Moore has reviewed part C and D and concurs with the diagnosis.  Dr.  Berline Lopes was notified on 09/02/2020.      ONCOLOGY TABLE:   UTERUS, CARCINOMA OR CARCINOSARCOMA: Resection   Procedure: Total hysterectomy and bilateral salpingo-oophorectomy  Histologic Type: Endometrioid adenocarcinoma  Histologic  Grade: FIGO grade 2  Myometrial Invasion:       Depth of Myometrial Invasion (mm): 6 mm       Myometrial Thickness (mm): 20 mm       Percentage of Myometrial Invasion: Less than 50%  Uterine Serosa Involvement: Not identified  Cervical stromal Involvement: Not identified  Extent of involvement of other tissue/organs: Not identified  Peritoneal/Ascitic Fluid: Not applicable  Lymphovascular Invasion: Not identified  Regional Lymph Nodes:       Pelvic Lymph Nodes Examined:  3 Sentinel             0 Non-sentinel             3 Total       Pelvic Lymph Nodes with Metastasis: 1             Macrometastasis: (>2.0 mm): 0             Micrometastasis: (>0.2 mm and < 2.0 mm): 1             Isolated Tumor Cells (<0.2 mm): 0             Laterality of Lymph Node with Tumor: Left             Extracapsular Extension: Not identified       Para-aortic Lymph Nodes Examined:              0 Sentinel              0 Non-sentinel              0 Total  Distant Metastasis:       Distant Site(s) Involved: Not applicable  Pathologic Stage Classification (pTNM, AJCC 8th Edition): pT1a, pN79mi  Ancillary Studies: MMR / MSI testing will be ordered  Representative Tumor Block: D5  Comment(s): Pancytokeratin was performed on the lymph nodes.    08/27/2020 Cancer Staging   Staging form: Corpus Uteri - Carcinoma and Carcinosarcoma, AJCC 8th Edition - Clinical stage from 08/27/2020: FIGO Stage IIIC1 (cT1a, cN37mi, cM0) - Signed by Lafonda Mosses, MD on 09/03/2020    10/15/2020 - 12/09/2020 Radiation Therapy   Radiation treatment dates:    IMRT: 10/15/20 - 11/18/20 HDR: 3/31, 4/5, 12/09/20   Site/dose:    IMRT: Pelvis / 45 Gy in 25 Gy HDR: Vaginal Cylinder, 2.5 cm / 18 Gy in 3 fractions   Beams/energy:    IMRT: 6X HDR: Ir-192, 3.0 cm length, 1 channel and 7 dwells     Interval History: Patient reports doing well.  Denies any vaginal bleeding or discharge.  Has a little bit of leaking versus discharge with  movement, such as going from sitting to standing or standing to sitting.  Denies leaking throughout the day otherwise.  She reports regular bowel and bladder function.  Endorses a good appetite, has been able to lose a little weight intentionally.  Denies any abdominal or pelvic pain.  Past Medical/Surgical History: Past Medical History:  Diagnosis Date   DM type 2 (diabetes mellitus, type 2) ([place])    Elevated ALT measurement 04/18/2018   resolved   Endometrial cancer [facility])    History of radiation therapy 10/15/20-11/18/20   IMRT endometrium - Dr. Gery Pray   History of radiation therapy 11/27/2020, 12/02/2020, 12/09/2020   vaginal brachytherapy - HDR to endometrium      Dr Gery Pray   Hyperlipidemia    Hypertension    Obesity    Osteopenia determined by x-ray    PMB (postmenopausal bleeding)    SVD (spontaneous vaginal delivery)    x 8    Past Surgical History:  Procedure Laterality Date   DILATATION & CURETTAGE/HYSTEROSCOPY WITH MYOSURE N/A 07/18/2020   Procedure: [place];  Surgeon: Princess Bruins, MD;  Location: [facility];  Service: Gynecology;  Laterality: N/A;   DILATION AND CURETTAGE OF UTERUS N/A 01/25/2014   Procedure: DILATATION AND CURETTAGE;  Surgeon: Emily Filbert, MD;  Location: [facility];  Service: Gynecology;  Laterality: N/A;   MULTIPLE TOOTH EXTRACTIONS  yrs ago   ROBOTIC ASSISTED TOTAL HYSTERECTOMY WITH BILATERAL SALPINGO OOPHERECTOMY N/A 08/27/2020   Procedure: XI ROBOTIC ASSISTED TOTAL HYSTERECTOMY WITH BILATERAL SALPINGO OOPHORECTOMY,;  Surgeon: Lafonda Mosses, MD;  Location: [facility];  Service: Gynecology;  Laterality: N/A;   SENTINEL NODE BIOPSY N/A 08/27/2020   Procedure: SENTINEL LYMPH NODE BIOPSY;  Surgeon: Lafonda Mosses, MD;  Location: [facility];  Service: Gynecology;  Laterality: N/A;   TUBAL LIGATION  yrs ago    Family History  Problem Relation Age of Onset   Hypertension Mother     Alcohol abuse Father    Anemia Brother    Alcohol abuse Brother    Breast cancer Cousin        maternal cousin   Colon cancer Neg Hx    Esophageal cancer Neg Hx    Rectal cancer Neg Hx    Stomach cancer Neg Hx    Ovarian cancer Neg Hx     Social History   Socioeconomic History   Marital status: Divorced  Spouse name: Not on file   Number of children: Not on file   Years of education: Not on file   Highest education level: Not on file  Occupational History   Not on file  Tobacco Use   Smoking status: Never   Smokeless tobacco: Never  Vaping Use   Vaping Use: Never used  Substance and Sexual Activity   Alcohol use: No   Drug use: No   Sexual activity: Not Currently    Birth control/protection: Post-menopausal  Other Topics Concern   Not on file  Social History Narrative   Not on file   Social Determinants of Health   Financial Resource Strain: Low Risk    Difficulty of Paying Living Expenses: Not hard at all  Food Insecurity: No Food Insecurity   Worried About [place] [employer] in the Last Year: Never true   [place] in the Last Year: Never true  Transportation Needs: No Transportation Needs   Lack of Transportation (Medical): No   Lack of Transportation (Non-Medical): No  Physical Activity: Insufficiently Active   Days of Exercise per Week: 5 days   Minutes of Exercise per Session: 20 min  Stress: No Stress Concern Present   Feeling of Stress : Not at all  Social Connections: Not on file    Current Medications:  Current Outpatient Medications:    acetaminophen (TYLENOL) 500 MG tablet, Take 500-1,000 mg by mouth every 6 (six) hours as needed (pain.)., Disp: , Rfl:    Cholecalciferol (VITAMIN D3) 25 MCG (1000 UT) CAPS, Take 1,000 Units by mouth daily., Disp: , Rfl:    gabapentin (NEURONTIN) 100 MG capsule, Take 1 capsule (100 mg total) by mouth at bedtime., Disp: 30 capsule, Rfl: 1   ibuprofen (ADVIL) 200 MG tablet, Take 600 mg by mouth every 8  (eight) hours as needed (pain.)., Disp: , Rfl:    lisinopril (ZESTRIL) 10 MG tablet, Take 1 tablet (10 mg total) by mouth daily., Disp: 90 tablet, Rfl: 0   metFORMIN (GLUCOPHAGE) 500 MG tablet, Take 1 tablet (500 mg total) by mouth daily with breakfast., Disp: 90 tablet, Rfl: 0   simvastatin (ZOCOR) 20 MG tablet, Take 1 tablet (20 mg total) by mouth at bedtime., Disp: 90 tablet, Rfl: 0   traZODone (DESYREL) 50 MG tablet, Take 0.5 tablets (25 mg total) by mouth at bedtime as needed for sleep., Disp: 30 tablet, Rfl: 1  Review of Systems: Denies appetite changes, fevers, chills, fatigue, unexplained weight changes. Denies hearing loss, neck lumps or masses, mouth sores, ringing in ears or voice changes. Denies cough or wheezing.  Denies shortness of breath. Denies chest pain or palpitations. Denies leg swelling. Denies abdominal distention, pain, blood in stools, constipation, diarrhea, nausea, vomiting, or early satiety. Denies pain with intercourse, dysuria, frequency, hematuria. Denies hot flashes, pelvic pain, vaginal bleeding or vaginal discharge.   Denies joint pain, back pain or muscle pain/cramps. Denies itching, rash, or wounds. Denies dizziness, headaches, numbness or seizures. Denies swollen lymph nodes or glands, denies easy bruising or bleeding. Denies anxiety, depression, confusion, or decreased concentration.  Physical Exam: BP 127/73 (BP Location: Left Arm, Patient Position: Sitting)    Pulse 69    Temp 97.6 F (36.4 C) (Oral)    Resp 18    Ht 5' 0.24" (1.53 m)    Wt 164 lb 6.4 oz (74.6 kg)    SpO2 100%    BMI 31.86 kg/m  General: Alert, oriented, no acute distress. HEENT: Normocephalic,  atraumatic, sclera anicteric. Chest: Clear to auscultation bilaterally.  No wheezes or rhonchi. Cardiovascular: Regular rate and rhythm, no murmurs. Abdomen: soft, nontender.  Normoactive bowel sounds.  No masses or hepatosplenomegaly appreciated.  Well-healed incisions. Extremities: Grossly  normal range of motion.  Warm, well perfused.  No edema bilaterally. Skin: No rashes or lesions noted. Lymphatics: No cervical, supraclavicular, or inguinal adenopathy. GU: Normal appearing external genitalia without erythema, excoriation, or lesions.  Speculum exam reveals moderately atrophic vaginal mucosa, radiation changes noted, no masses or lesions.  No discharge or fluid with thin the vaginal vault, had patient Valsalva as well.  Bimanual exam reveals cuff intact, no nodularity or masses.  Rectovaginal exam confirms these findings.  Laboratory & Radiologic Studies: None new  Assessment & Plan: Julie Orozco is a 76 y.o. 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  Patient is overall doing well and is NED on exam.  Given symptoms of some small amount of leakage with movement, I suspect that she is having some stress urinary incontinence.  Discussed referral either to pelvic floor physical therapy to help strengthen pelvic floor muscles versus urology/urogynecology.  Patient was interested in trying pelvic floor physical therapy.  I placed a referral for this today.  I do not see any evidence of fistula on exam, and her report of timing of leakage is most consistent with stress incontinence.   We discussed signs and symptoms that would be concerning for disease recurrence, and the patient knows to call if she develops any of these between visits.   Given her high risk disease, we will continue with visits every 3 months. She is scheduled to see Dr. Sondra Come for follow-up in April.  She will call in the early summer to get a visit scheduled to see me back in July.   28 minutes of total time was spent for this patient encounter, including preparation, face-to-face counseling with the patient and coordination of care, and documentation of the encounter.  Jeral Pinch, MD  Division  of Gynecologic Oncology  Department of Obstetrics and Gynecology  [facility]

## 2021-09-16 ENCOUNTER — Ambulatory Visit
Admission: RE | Admit: 2021-09-16 | Discharge: 2021-09-16 | Disposition: A | Discharge status: Home / Self Care | Payer: Medicare Other | Source: Ambulatory Visit | Attending: Family Medicine | Admitting: Family Medicine

## 2021-09-16 DIAGNOSIS — Z1231 Encounter for screening mammogram for malignant neoplasm of breast: Secondary | ICD-10-CM | POA: Diagnosis not present

## 2021-11-06 ENCOUNTER — Ambulatory Visit (INDEPENDENT_AMBULATORY_CARE_PROVIDER_SITE_OTHER): Payer: Medicare Other | Admitting: Physician Assistant

## 2021-11-06 ENCOUNTER — Other Ambulatory Visit: Payer: Self-pay

## 2021-11-06 ENCOUNTER — Encounter: Payer: Self-pay | Admitting: Physician Assistant

## 2021-11-06 VITALS — BP 130/70 | HR 63 | Ht 60.25 in | Wt 168.6 lb

## 2021-11-06 DIAGNOSIS — E119 Type 2 diabetes mellitus without complications: Secondary | ICD-10-CM | POA: Diagnosis not present

## 2021-11-06 DIAGNOSIS — I10 Essential (primary) hypertension: Secondary | ICD-10-CM | POA: Diagnosis not present

## 2021-11-06 DIAGNOSIS — L304 Erythema intertrigo: Secondary | ICD-10-CM

## 2021-11-06 DIAGNOSIS — Z23 Encounter for immunization: Secondary | ICD-10-CM | POA: Diagnosis not present

## 2021-11-06 MED ORDER — CLOTRIMAZOLE-BETAMETHASONE 1-0.05 % EX CREA: 1.0000 "application " | TOPICAL_CREAM | Freq: Two times a day (BID) | CUTANEOUS | 0 refills | Status: AC

## 2021-11-06 NOTE — Progress Notes (Signed)
Acute Office Visit  Subjective:    Patient ID: Julie Orozco, female    DOB: 1946-08-19, 76 y.o.   MRN: [id number]  Chief Complaint  Patient presents with   Rash    Rash on neck for about 2-3 weeks. No other concerns    HPI Patient is in today for a follow up appointment. Speaks Spanish and is with her Daughter who is interpreting. Reports an itchy rash on her neck for 3 weeks, feels itchy, dry skin with bumps that dry out; had a similar rash 2 - 3 years ago; denies new irritants; used OTC hydrocortisone and was a little helpful.  Past Medical History:  Diagnosis Date   DM type 2 (diabetes mellitus, type 2) ([place])    Elevated ALT measurement 04/18/2018   resolved   Endometrial cancer [facility])    History of radiation therapy 10/15/20-11/18/20   IMRT endometrium - Dr. Gery Pray   History of radiation therapy 11/27/2020, 12/02/2020, 12/09/2020   vaginal brachytherapy - HDR to endometrium      Dr Gery Pray   Hyperlipidemia    Hypertension    Obesity    Osteopenia determined by x-ray    PMB (postmenopausal bleeding)    SVD (spontaneous vaginal delivery)    x 8    Past Surgical History:  Procedure Laterality Date   DILATATION & CURETTAGE/HYSTEROSCOPY WITH MYOSURE N/A 07/18/2020   Procedure: [place];  Surgeon: Princess Bruins, MD;  Location: [facility];  Service: Gynecology;  Laterality: N/A;   DILATION AND CURETTAGE OF UTERUS N/A 01/25/2014   Procedure: DILATATION AND CURETTAGE;  Surgeon: Emily Filbert, MD;  Location: [facility];  Service: Gynecology;  Laterality: N/A;   MULTIPLE TOOTH EXTRACTIONS  yrs ago   ROBOTIC ASSISTED TOTAL HYSTERECTOMY WITH BILATERAL SALPINGO OOPHERECTOMY N/A 08/27/2020   Procedure: XI ROBOTIC ASSISTED TOTAL HYSTERECTOMY WITH BILATERAL SALPINGO OOPHORECTOMY,;  Surgeon: Lafonda Mosses, MD;  Location: [facility];  Service: Gynecology;  Laterality: N/A;   SENTINEL NODE BIOPSY N/A 08/27/2020    Procedure: SENTINEL LYMPH NODE BIOPSY;  Surgeon: Lafonda Mosses, MD;  Location[facility];  Service: Gynecology;  Laterality: N/A;   TUBAL LIGATION  yrs ago    Family History  Problem Relation Age of Onset   Hypertension Mother    Alcohol abuse Father    Anemia Brother    Alcohol abuse Brother    Breast cancer Cousin        maternal cousin   Colon cancer Neg Hx    Esophageal cancer Neg Hx    Rectal cancer Neg Hx    Stomach cancer Neg Hx    Ovarian cancer Neg Hx     Social History   Socioeconomic History   Marital status: Divorced    Spouse name: Not on file   Number of children: Not on file   Years of education: Not on file   Highest education level: Not on file  Occupational History   Not on file  Tobacco Use   Smoking status: Never   Smokeless tobacco: Never  Vaping Use   Vaping Use: Never used  Substance and Sexual Activity   Alcohol use: No   Drug use: No   Sexual activity: Not Currently    Birth control/protection: Post-menopausal  Other Topics Concern   Not on file  Social History Narrative   Not on file   Social Determinants of Health   Financial Resource Strain: Low Risk  Difficulty of Paying Living Expenses: Not hard at all  Food Insecurity: No Food Insecurity   Worried About [place] in the Last Year: Never true   Ran Out of Food in the Last Year: Never true  Transportation Needs: No Transportation Needs   Lack of Transportation (Medical): No   Lack of Transportation (Non-Medical): No  Physical Activity: Insufficiently Active   Days of Exercise per Week: 5 days   Minutes of Exercise per Session: 20 min  Stress: No Stress Concern Present   Feeling of Stress : Not at all  Social Connections: Not on file  Intimate Partner Violence: Not on file    Outpatient Medications Prior to Visit  Medication Sig Dispense Refill   acetaminophen (TYLENOL) 500 MG tablet Take 500-1,000 mg by mouth every 6 (six) hours as needed (pain.).      Cholecalciferol (VITAMIN D3) 25 MCG (1000 UT) CAPS Take 1,000 Units by mouth daily.     gabapentin (NEURONTIN) 100 MG capsule Take 1 capsule (100 mg total) by mouth at bedtime. 30 capsule 1   lisinopril (ZESTRIL) 10 MG tablet Take 1 tablet (10 mg total) by mouth daily. 90 tablet 0   metFORMIN (GLUCOPHAGE) 500 MG tablet Take 1 tablet (500 mg total) by mouth daily with breakfast. 90 tablet 0   simvastatin (ZOCOR) 20 MG tablet Take 1 tablet (20 mg total) by mouth at bedtime. 90 tablet 0   traZODone (DESYREL) 50 MG tablet Take 0.5 tablets (25 mg total) by mouth at bedtime as needed for sleep. 30 tablet 1   ibuprofen (ADVIL) 200 MG tablet Take 600 mg by mouth every 8 (eight) hours as needed (pain.). (Patient not taking: Reported on 11/06/2021)     No facility-administered medications prior to visit.    No Known Allergies  Review of Systems  Constitutional:  Negative for activity change and chills.  HENT:  Negative for congestion and voice change.   Eyes:  Negative for pain and redness.  Respiratory:  Negative for cough and wheezing.   Cardiovascular:  Negative for chest pain.  Gastrointestinal:  Negative for constipation, diarrhea, nausea and vomiting.  Endocrine: Negative for polyuria.  Genitourinary:  Negative for frequency.  Skin:  Positive for rash. Negative for color change.  Allergic/Immunologic: Negative for immunocompromised state.  Neurological:  Negative for dizziness.  Psychiatric/Behavioral:  Negative for agitation.       Objective:    Physical Exam Vitals and nursing note reviewed.  Constitutional:      General: She is not in acute distress.    Appearance: Normal appearance. She is not ill-appearing.  HENT:     Head: Normocephalic and atraumatic.     Right Ear: External ear normal.     Left Ear: External ear normal.     Nose: No congestion.  Eyes:     Extraocular Movements: Extraocular movements intact.     Conjunctiva/sclera: Conjunctivae normal.     Pupils: Pupils  are equal, round, and reactive to light.  Cardiovascular:     Rate and Rhythm: Normal rate and regular rhythm.     Pulses: Normal pulses.     Heart sounds: Normal heart sounds.  Pulmonary:     Effort: Pulmonary effort is normal.     Breath sounds: Normal breath sounds. No wheezing.  Abdominal:     General: Bowel sounds are normal.     Palpations: Abdomen is soft.  Musculoskeletal:     Cervical back: Normal range of motion and neck supple.  Right lower leg: No edema.     Left lower leg: No edema.  Skin:    General: Skin is warm and dry.     Findings: Erythema and rash present. No bruising.          Comments: Left side of lower neck with a dry,mildy erythematous rash; no tenderness to palpation; no open wounds  Neurological:     General: No focal deficit present.     Mental Status: She is alert and oriented to person, place, and time.  Psychiatric:        Mood and Affect: Mood normal.        Behavior: Behavior normal.        Thought Content: Thought content normal.    BP 130/70    Pulse 63    Wt 168 lb 9.6 oz (76.5 kg)    SpO2 96%    BMI 32.67 kg/m   Wt Readings from Last 3 Encounters:  11/06/21 168 lb 9.6 oz (76.5 kg)  09/14/21 164 lb 6.4 oz (74.6 kg)  08/14/21 169 lb 6.4 oz (76.8 kg)    Health Maintenance Due  Topic Date Due   Zoster Vaccines- Shingrix (1 of 2) Never done   COVID-19 Vaccine (4 - Booster for Pfizer series) 10/03/2020   OPHTHALMOLOGY EXAM  11/01/2020   FOOT EXAM  06/05/2021   TETANUS/TDAP  08/30/2021   HEMOGLOBIN A1C  10/27/2021    There are no preventive care reminders to display for this patient.   Lab Results  Component Value Date   TSH 1.800 04/28/2021   Lab Results  Component Value Date   WBC 5.4 04/28/2021   HGB 12.7 04/28/2021   HCT 37.9 04/28/2021   MCV 92 04/28/2021   PLT 232 04/28/2021   Lab Results  Component Value Date   NA 140 04/28/2021   K 4.8 04/28/2021   CO2 25 04/28/2021   GLUCOSE 108 (H) 04/28/2021   BUN 16  04/28/2021   CREATININE 0.62 04/28/2021   BILITOT 0.3 04/28/2021   ALKPHOS 128 (H) 04/28/2021   AST 14 04/28/2021   ALT 15 04/28/2021   PROT 7.2 04/28/2021   ALBUMIN 4.4 04/28/2021   CALCIUM 9.8 04/28/2021   ANIONGAP 10 08/18/2020   EGFR 94 04/28/2021   Lab Results  Component Value Date   CHOL 173 04/28/2021   Lab Results  Component Value Date   HDL 53 04/28/2021   Lab Results  Component Value Date   LDLCALC 101 (H) 04/28/2021   Lab Results  Component Value Date   TRIG 107 04/28/2021   Lab Results  Component Value Date   CHOLHDL 3.3 04/28/2021   Lab Results  Component Value Date   HGBA1C 6.2 (H) 04/28/2021       Assessment & Plan:   Problem List Items Addressed This Visit   None    No orders of the defined types were placed in this encounter.  Return in 1 month for annual exam and routine labs.  Irene Pap, PA-C

## 2021-12-03 ENCOUNTER — Ambulatory Visit: Payer: Medicare Other | Admitting: Physician Assistant

## 2021-12-03 NOTE — Progress Notes (Deleted)
? ?  Acute Office Visit ? ?Subjective:  ? ? Patient ID: Julie Orozco, female    DOB: 1946-05-04, 76 y.o.   MRN: [id number] ? ?No chief complaint on file. ? ? ?HPI ?Patient is in today for *** ? ?Outpatient Medications Prior to Visit  ?Medication Sig Dispense Refill  ? acetaminophen (TYLENOL) 500 MG tablet Take 500-1,000 mg by mouth every 6 (six) hours as needed (pain.).    ? Cholecalciferol (VITAMIN D3) 25 MCG (1000 UT) CAPS Take 1,000 Units by mouth daily.    ? gabapentin (NEURONTIN) 100 MG capsule Take 1 capsule (100 mg total) by mouth at bedtime. 30 capsule 1  ? ibuprofen (ADVIL) 200 MG tablet Take 600 mg by mouth every 8 (eight) hours as needed (pain.). (Patient not taking: Reported on 11/06/2021)    ? lisinopril (ZESTRIL) 10 MG tablet Take 1 tablet (10 mg total) by mouth daily. 90 tablet 0  ? metFORMIN (GLUCOPHAGE) 500 MG tablet Take 1 tablet (500 mg total) by mouth daily with breakfast. 90 tablet 0  ? simvastatin (ZOCOR) 20 MG tablet Take 1 tablet (20 mg total) by mouth at bedtime. 90 tablet 0  ? traZODone (DESYREL) 50 MG tablet Take 0.5 tablets (25 mg total) by mouth at bedtime as needed for sleep. 30 tablet 1  ? ?No facility-administered medications prior to visit.  ? ? ?No Known Allergies ? ?Review of Systems ? ?   ?Objective:  ?  ?Physical Exam ? ?There were no vitals taken for this visit. ? ?Wt Readings from Last 3 Encounters:  ?11/06/21 168 lb 9.6 oz (76.5 kg)  ?09/14/21 164 lb 6.4 oz (74.6 kg)  ?08/14/21 169 lb 6.4 oz (76.8 kg)  ? ? ?Results for orders placed or performed in visit on 07/30/21  ?Microalbumin/Creatinine Ratio, Urine  ?Result Value Ref Range  ? Creatinine, Urine 155.8 Not Estab. mg/dL  ? Microalbumin, Urine 44.1 Not Estab. ug/mL  ? Microalb/Creat Ratio 28 0 - 29 mg/g creat  ? ? ?   ?Assessment & Plan:  ?There are no diagnoses linked to this encounter. ? ? ?No orders of the defined types were placed in this encounter. ? ? ?No follow-ups on file. ? ?Irene Pap, PA-C ?

## 2021-12-15 ENCOUNTER — Telehealth: Payer: Self-pay | Admitting: *Deleted

## 2021-12-15 NOTE — Telephone Encounter (Signed)
CALLED JULIE THE INTERP. TO ASK HER TO CALL THIS PATIENT AND SEE IF SHE WOULD BE WILLING TO COME ON 01-28-22 FOR FU, DUE TO DR. KINARD BEING ON VACATION, JULIE TO CALL AND LET ME KNOW IF THIS PATIENT CAN'T MAKE THIS APPT. ?

## 2021-12-24 ENCOUNTER — Ambulatory Visit: Payer: Self-pay | Admitting: Radiation Oncology

## 2021-12-31 ENCOUNTER — Other Ambulatory Visit: Payer: Self-pay

## 2021-12-31 ENCOUNTER — Telehealth: Payer: Self-pay | Admitting: Physician Assistant

## 2021-12-31 ENCOUNTER — Ambulatory Visit: Payer: Medicare Other | Admitting: Physician Assistant

## 2021-12-31 DIAGNOSIS — E119 Type 2 diabetes mellitus without complications: Secondary | ICD-10-CM

## 2021-12-31 DIAGNOSIS — I10 Essential (primary) hypertension: Secondary | ICD-10-CM

## 2021-12-31 MED ORDER — SIMVASTATIN 20 MG PO TABS: 20.0000 mg | ORAL_TABLET | Freq: Every day | ORAL | 0 refills | Status: DC

## 2021-12-31 MED ORDER — LISINOPRIL 10 MG PO TABS: 10.0000 mg | ORAL_TABLET | Freq: Every day | ORAL | 0 refills | Status: DC

## 2021-12-31 MED ORDER — METFORMIN HCL 500 MG PO TABS: 500.0000 mg | ORAL_TABLET | Freq: Every day | ORAL | 0 refills | Status: DC

## 2021-12-31 NOTE — Telephone Encounter (Signed)
Pt arrived late for her appt and per Sula Soda had to reschedule. Daughter states that pt will need refills on her meds prior to rescheduled appt. Pt uses [facility] on [place].  ?

## 2022-01-06 ENCOUNTER — Telehealth: Payer: Self-pay | Admitting: *Deleted

## 2022-01-06 NOTE — Telephone Encounter (Signed)
CALLED JULIE THE [employer] INTERP. AND HAD HER CALL THIS PATIENT TO ALTER FU  ON 01-28-22 DUE TO DR. KINARD BEING ON VACATION, RESCHEDULED FOR 02-04-22 @ 9:30 AM , JULIE TO LET ME KNOW IF THIS APPT. IS DOABLE FOR THIS PATIENT ?

## 2022-01-28 ENCOUNTER — Ambulatory Visit: Payer: Medicare Other | Admitting: Radiation Oncology

## 2022-02-03 NOTE — Progress Notes (Signed)
Radiation Oncology         [phone] ________________________________  Name: Julie Orozco MRN: [id number]  Date: 02/04/2022  DOB: 03-Sep-1945  Follow-Up Visit Note  CC: Irene Pap, PA-C  [place], Vickie L, NP-C    ICD-10-CM   1. Endometrial adenocarcinoma (HCC)  C54.1       Diagnosis:  Stage III endometrioid adenocarcinoma (pT1a, pN70m, M0)  Interval Since Last Radiation: 1 year, 1 month, and 27 days   Radiation treatment dates:    IMRT: 10/15/20 - 11/18/20 HDR: 3/31, 4/5, 12/09/20   Site/dose:    IMRT: Pelvis / 45 Gy in 25 Gy HDR: Vaginal Cylinder, 2.5 cm / 18 Gy in 3 fractions   Beams/energy:    IMRT: 6X HDR: Ir-192, 3.0 cm length, 1 channel and 7 dwells  Narrative:  The patient returns today for routine 6 month follow-up, she was lat seen here for follow up on 06/18/21. Since her last visit,  the patient followed up with Dr. TBerline Lopeson 09/14/21. During which time, the patient reported some leaking (but no discharge) with movement. She otherwise denied any symptoms concerning for disease recurrence and was noted as NED on examination. Given her symptoms of leaking with movement, Dr. TBerline Lopessuspected the patient to be having some stress urinary incontinence and placed a referral to pelvic floor physical therapy.                Of note: the patient had a routine screening mammogram on 09/16/21 which showed no evidence of malignancy in either breast.   Patient denies any abdominal bloating vaginal bleeding or discharge.  She denies any hematuria or rectal bleeding.  She occasionally will have some urinary incontinence.  She did not pursue physical therapy concerning this issue due to time constraints and transportation issues.  She does not wish to pursue this at this time but will let [place]now if it becomes more significant.             Allergies:  has No Known Allergies.  Meds: Current Outpatient Medications  Medication Sig Dispense Refill   Cholecalciferol  (VITAMIN D3) 25 MCG (1000 UT) CAPS Take 1,000 Units by mouth daily.     ibuprofen (ADVIL) 200 MG tablet Take 600 mg by mouth every 8 (eight) hours as needed (pain.).     lisinopril (ZESTRIL) 10 MG tablet Take 1 tablet (10 mg total) by mouth daily. 90 tablet 0   metFORMIN (GLUCOPHAGE) 500 MG tablet Take 1 tablet (500 mg total) by mouth daily with breakfast. 90 tablet 0   simvastatin (ZOCOR) 20 MG tablet Take 1 tablet (20 mg total) by mouth at bedtime. 90 tablet 0   traZODone (DESYREL) 50 MG tablet Take 0.5 tablets (25 mg total) by mouth at bedtime as needed for sleep. 30 tablet 1   acetaminophen (TYLENOL) 500 MG tablet Take 500-1,000 mg by mouth every 6 (six) hours as needed (pain.). (Patient not taking: Reported on 02/04/2022)     gabapentin (NEURONTIN) 100 MG capsule Take 1 capsule (100 mg total) by mouth at bedtime. (Patient not taking: Reported on 02/04/2022) 30 capsule 1   No current facility-administered medications for this encounter.    Physical Findings: The patient is in no acute distress. Patient is alert and oriented.  She is accompanied by her daughter who serves as the interpreter.  weight is 170 lb 8 oz (77.3 kg). Her blood pressure is 128/72 and her pulse is 65. Her respiration is 18 and oxygen  saturation is 100%. .  No significant changes. Lungs are clear to auscultation bilaterally. Heart has regular rate and rhythm. No palpable cervical, supraclavicular, or axillary adenopathy. Abdomen soft, non-tender, normal bowel sounds.  On pelvic examination the external genitalia were unremarkable. A speculum exam was performed.  Some adhesions were noted in the vaginal vault which were easily broken.  This called some mild bleeding.  There are no mucosal lesions noted in the vaginal vault.  Some radiation changes noted in the upper vaginal area.  The mucosa is somewhat friable and bleeds easily along the anterior upper vaginal vault.  On bimanual and rectovaginal examination there were no pelvic  masses appreciated.  Rectal sphincter tone normal.   Lab Findings: Lab Results  Component Value Date   WBC 5.4 04/28/2021   HGB 12.7 04/28/2021   HCT 37.9 04/28/2021   MCV 92 04/28/2021   PLT 232 04/28/2021    Radiographic Findings: No results found.  Impression: Stage III endometrioid adenocarcinoma (pT1a, pN78m, M0)  No evidence of recurrence on clinical exam today.  She does not wish to consider using the vaginal dilator given her age but we stressed the importance of this issue and how it can help prevent agglutination.  Plan: She will follow-up with Dr. TBerline Lopesin 3 months.  Routine follow-up in radiation oncology in 6 months.   25 minutes of total time was spent for this patient encounter, including preparation, face-to-face counseling with the patient and coordination of care, physical exam, and documentation of the encounter. ____________________________________  JBlair Promise PhD, MD  This document serves as a record of services personally performed by JGery Pray MD. It was created on his behalf by ERoney Mans a trained medical scribe. The creation of this record is based on the scribe's personal observations and the provider's statements to them. This document has been checked and approved by the attending provider.

## 2022-02-03 NOTE — Progress Notes (Incomplete)
Julie Orozco is here today for follow up post radiation to the pelvic.  They completed their radiation on: 12/09/20   Does the patient complain of any of the following:  Pain:*** Abdominal bloating: *** Diarrhea/Constipation: *** Nausea/Vomiting: *** Vaginal Discharge: *** Blood in Urine or Stool: *** Urinary Issues (dysuria/incomplete emptying/ incontinence/ increased frequency/urgency): *** Does patient report using vaginal dilator 2-3 times a week and/or sexually active 2-3 weeks: *** Post radiation skin changes: ***   Additional comments if applicable:

## 2022-02-03 NOTE — Progress Notes (Deleted)
Julie Orozco is a 76 y.o. female who presents for annual wellness visit and follow-up on chronic medical conditions.    She has the following concerns:  Reports she is generally feeling well, fairly well, poorly*; is eating a *** diet; is sleeping well ***; drinks *** bottles of water a day; is exercising *  Immunizations and Health Maintenance Immunization History  Administered Date(s) Administered   Fluad Quad(high Dose 65+) 06/05/2020, 05/25/2021   Influenza, High Dose Seasonal PF 06/02/2016, 05/25/2017, 08/08/2018   PFIZER(Purple Top)SARS-COV-2 Vaccination 11/21/2019, 12/12/2019, 08/08/2020   Pfizer Covid-19 Vaccine Bivalent Booster 63yr & up 11/06/2021   Pneumococcal Conjugate-13 06/14/2016   Pneumococcal Polysaccharide-23 11/02/2017   Tdap 08/31/2011   Health Maintenance  Topic Date Due   HEMOGLOBIN A1C  10/27/2021   TETANUS/TDAP  02/16/2022 (Originally 08/30/2021)   Zoster Vaccines- Shingrix (1 of 2) 02/17/2022 (Originally 12/30/1966)   OPHTHALMOLOGY EXAM  03/18/2022 (Originally 11/01/2020)   FOOT EXAM  03/23/2022 (Originally 06/05/2021)   INFLUENZA VACCINE  03/30/2022   COLONOSCOPY (Pts 45-460yrInsurance coverage will need to be confirmed)  09/11/2022   MAMMOGRAM  09/17/2023   Pneumonia Vaccine 6568Years old  Completed   DEXA SCAN  Completed   COVID-19 Vaccine  Completed   Hepatitis C Screening  Completed   HPV VACCINES  Aged Out     The 10-year ASCVD risk score (Arnett DK, et al., 2019) is: 33.9%  Dentist: Ophtho: Exercise:  Patient Care Team: WiMarcellina Millins PCP - General (Physician Assistant)   Advanced directives:    Depression screen:  See questionnaire below.     11/06/2021    9:21 AM 08/14/2021    9:21 AM 05/25/2021    9:20 AM 06/05/2020   10:21 AM 05/30/2019   10:29 AM  Depression screen PHQ 2/9  Decreased Interest 0 0 0 0 0  Down, Depressed, Hopeless 0 0 0 0 0  PHQ - 2 Score 0 0 0 0 0    Fall Risk Screen: see questionnaire  below.    11/06/2021    9:20 AM 08/14/2021    9:21 AM 05/25/2021    9:20 AM 06/05/2020   10:21 AM 05/30/2019   10:29 AM  Fall Risk   Falls in the past year? 0 0 0 0 0  Number falls in past yr: 0  0 0 0  Injury with Fall? 0  0 0 0  Risk for fall due to : No Fall Risks Medication side effect No Fall Risks    Follow up Falls evaluation completed Falls evaluation completed;Education provided;Falls prevention discussed Falls evaluation completed      ADL screen:  See questionnaire below Functional Status Survey:     Review of Systems  There were no vitals taken for this visit.  Physical Exam  ASSESSMENT/PLAN  Problem List Items Addressed This Visit   None   Discussed monthly self breast exams and yearly mammograms; at least 30 minutes of aerobic activity at least 5 days/week and weight-bearing exercise 2x/week; proper sunscreen use reviewed; healthy diet, including goals of calcium and vitamin D intake and alcohol recommendations (less than or equal to 1 drink/day) reviewed; regular seatbelt use; changing batteries in smoke detectors.  Immunization recommendations discussed.  Colonoscopy recommendations reviewed   Medicare Attestation I have personally reviewed: The patient's medical and social history Their use of alcohol, tobacco or illicit drugs Their current medications and supplements The patient's functional ability including ADLs,fall risks, home safety risks, cognitive, and hearing and  visual impairment Diet and physical activities Evidence for depression or mood disorders  The patient's weight, height, and BMI have been recorded in the chart.  I have made referrals, counseling, and provided education to the patient based on review of the above and I have provided the patient with a written personalized care plan for preventive services.    No follow-ups on file.    Irene Pap, PA-C   02/03/2022

## 2022-02-04 ENCOUNTER — Ambulatory Visit
Admission: RE | Admit: 2022-02-04 | Discharge: 2022-02-04 | Disposition: A | Discharge status: Home / Self Care | Payer: Medicare Other | Source: Ambulatory Visit | Attending: Radiation Oncology | Admitting: Radiation Oncology

## 2022-02-04 ENCOUNTER — Telehealth: Payer: Self-pay | Admitting: *Deleted

## 2022-02-04 ENCOUNTER — Ambulatory Visit: Payer: Medicare Other | Admitting: Physician Assistant

## 2022-02-04 ENCOUNTER — Telehealth: Payer: Self-pay

## 2022-02-04 ENCOUNTER — Encounter: Payer: Self-pay | Admitting: Radiation Oncology

## 2022-02-04 ENCOUNTER — Other Ambulatory Visit: Payer: Self-pay

## 2022-02-04 VITALS — BP 128/72 | HR 65 | Resp 18 | Wt 170.5 lb

## 2022-02-04 DIAGNOSIS — Z7984 Long term (current) use of oral hypoglycemic drugs: Secondary | ICD-10-CM | POA: Insufficient documentation

## 2022-02-04 DIAGNOSIS — Z8542 Personal history of malignant neoplasm of other parts of uterus: Secondary | ICD-10-CM | POA: Insufficient documentation

## 2022-02-04 DIAGNOSIS — C541 Malignant neoplasm of endometrium: Secondary | ICD-10-CM

## 2022-02-04 DIAGNOSIS — Z79899 Other long term (current) drug therapy: Secondary | ICD-10-CM | POA: Diagnosis not present

## 2022-02-04 DIAGNOSIS — Z923 Personal history of irradiation: Secondary | ICD-10-CM | POA: Insufficient documentation

## 2022-02-04 NOTE — Progress Notes (Signed)
Julie Orozco is here today for follow up post radiation to the pelvic.  They completed their radiation on: 10/15/20--12/09/20   Does the patient complain of any of the following:  Pain:No Abdominal bloating: sometime Diarrhea/Constipation: no Nausea/Vomiting: no Vaginal Discharge: no Blood in Urine or Stool: no Urinary Issues (dysuria/incomplete emptying/ incontinence/ increased frequency/urgency): no Does patient report using vaginal dilator 2-3 times a week and/or sexually active 2-3 weeks: no Post radiation skin changes: no   Additional comments if applicable: Vitals:   72/62/03 0935  BP: 128/72  Pulse: 65  Resp: 18  SpO2: 100%  Weight: 77.3 kg

## 2022-02-04 NOTE — Telephone Encounter (Signed)
CALLED PATIENT'S DAUGHTER- VIOLETA TO INFORM OF FU APPT. WITH DR. Berline Lopes ON 04-30-22- ARRIVAL TIME- 12:45 PM, SPOKE WITH PATIENT'S DAUGHTER- VIOLETA AND SHE IS AWARE OF THIS APPT.

## 2022-02-04 NOTE — Telephone Encounter (Signed)
Enid Derry from (RAD ONC) called office.  Pt is scheduled for a follow up on 04/30/22  at 1:15pm with Dr.Tucker.  Enid Derry to notify pt of appointment date and time.

## 2022-02-25 NOTE — Progress Notes (Signed)
Julie Orozco is a 76 y.o. female who presents for annual wellness visit with her Daughter who is her interpreter and follow-up on chronic medical conditions.    She has the following concerns:  Reports she is generally feeling well; is eating a healthy diet; is sleeping well about 7 - 8 hours; drinks 2 bottles of water a day; is exercising 3 - 4 times a week walks 25 minutes  Will schedule an Eye dr appt  Call patient with lab results  Immunizations and Health Maintenance Immunization History  Administered Date(s) Administered   Fluad Quad(high Dose 65+) 06/05/2020, 05/25/2021   Influenza, High Dose Seasonal PF 06/02/2016, 05/25/2017, 08/08/2018   PFIZER(Purple Top)SARS-COV-2 Vaccination 11/21/2019, 12/12/2019, 08/08/2020   Pfizer Covid-19 Vaccine Bivalent Booster 57yr & up 11/06/2021   Pneumococcal Conjugate-13 06/14/2016   Pneumococcal Polysaccharide-23 11/02/2017   Tdap 08/31/2011   Health Maintenance  Topic Date Due   Zoster Vaccines- Shingrix (1 of 2) Never done   TETANUS/TDAP  08/30/2021   HEMOGLOBIN A1C  10/27/2021   OPHTHALMOLOGY EXAM  03/18/2022 (Originally 11/01/2020)   FOOT EXAM  03/23/2022 (Originally 06/05/2021)   INFLUENZA VACCINE  03/30/2022   COLONOSCOPY (Pts 45-435yrInsurance coverage will need to be confirmed)  09/11/2022   MAMMOGRAM  09/17/2023   Pneumonia Vaccine [age]Years old  Completed   DEXA SCAN  Completed   COVID-19 Vaccine  Completed   Hepatitis C Screening  Completed   HPV VACCINES  Aged Out     The 10-year ASCVD risk score (Arnett DK, et al., 2019) is: 33.9%   Patient Care Team: WiMarcellina Millins PCP - General (Physician Assistant)   Advanced directives: patient reports she has living will at home    Depression screen:  See questionnaire below.     11/06/2021    9:21 AM 08/14/2021    9:21 AM 05/25/2021    9:20 AM 06/05/2020   10:21 AM 05/30/2019   10:29 AM  Depression screen PHQ 2/9  Decreased Interest 0 0 0 0 0   Down, Depressed, Hopeless 0 0 0 0 0  PHQ - 2 Score 0 0 0 0 0    Fall Risk Screen: see questionnaire below.    02/26/2022    9:23 AM 11/06/2021    9:20 AM 08/14/2021    9:21 AM 05/25/2021    9:20 AM 06/05/2020   10:21 AM  Fall Risk   Falls in the past year? 0 0 0 0 0  Number falls in past yr: 0 0  0 0  Injury with Fall? 0 0  0 0  Risk for fall due to : No Fall Risks No Fall Risks Medication side effect No Fall Risks   Follow up Falls evaluation completed Falls evaluation completed Falls evaluation completed;Education provided;Falls prevention discussed Falls evaluation completed     ADL screen:  See questionnaire below Functional Status Survey:     Review of Systems  Constitutional:  Negative for activity change and fever.  HENT:  Negative for congestion, ear pain and voice change.   Eyes:  Negative for redness.  Respiratory:  Negative for cough.   Cardiovascular:  Negative for chest pain.  Gastrointestinal:  Negative for constipation and diarrhea.  Endocrine: Negative for polyuria.  Genitourinary:  Negative for flank pain.  Musculoskeletal:  Negative for gait problem and neck stiffness.  Skin:  Negative for color change and rash.  Neurological:  Negative for dizziness.  Hematological:  Negative for adenopathy.  Psychiatric/Behavioral:  Negative for agitation, behavioral problems and confusion.     BP 130/70   Pulse (!) 56   Wt 170 lb 9.6 oz (77.4 kg)   SpO2 98%   BMI 33.04 kg/m   Physical Exam Vitals and nursing note reviewed.  Constitutional:      General: She is not in acute distress.    Appearance: Normal appearance. She is not ill-appearing.  HENT:     Head: Normocephalic and atraumatic.     Right Ear: Tympanic membrane, ear canal and external ear normal.     Left Ear: Tympanic membrane, ear canal and external ear normal.     Nose: No congestion.  Eyes:     Extraocular Movements: Extraocular movements intact.     Conjunctiva/sclera: Conjunctivae normal.      Pupils: Pupils are equal, round, and reactive to light.  Neck:     Vascular: No carotid bruit.  Cardiovascular:     Rate and Rhythm: Normal rate and regular rhythm.     Pulses: Normal pulses.     Heart sounds: Normal heart sounds.  Pulmonary:     Effort: Pulmonary effort is normal.     Breath sounds: Normal breath sounds. No wheezing.  Abdominal:     General: Bowel sounds are normal.     Palpations: Abdomen is soft.  Musculoskeletal:        General: Normal range of motion.     Cervical back: Normal range of motion and neck supple.     Right lower leg: No edema.     Left lower leg: No edema.  Skin:    General: Skin is warm and dry.     Findings: No bruising.  Neurological:     General: No focal deficit present.     Mental Status: She is alert and oriented to person, place, and time.  Psychiatric:        Mood and Affect: Mood normal.        Behavior: Behavior normal.        Thought Content: Thought content normal.     ASSESSMENT/PLAN  Problem List Items Addressed This Visit       Cardiovascular and Mediastinum   Essential hypertension   Relevant Medications   lisinopril (ZESTRIL) 10 MG tablet   simvastatin (ZOCOR) 20 MG tablet   Other Relevant Orders   Comprehensive metabolic panel     Endocrine   Controlled type 2 diabetes mellitus without complication, without long-term current use of insulin (HCC)   Relevant Medications   lisinopril (ZESTRIL) 10 MG tablet   metFORMIN (GLUCOPHAGE) 500 MG tablet   simvastatin (ZOCOR) 20 MG tablet   Other Relevant Orders   Comprehensive metabolic panel   Hemoglobin A1c   Other Visit Diagnoses     Medicare annual wellness visit, subsequent    -  Primary   Insomnia, unspecified type           Discussed monthly self breast exams and yearly mammograms; at least 30 minutes of aerobic activity at least 5 days/week and weight-bearing exercise 2x/week; proper sunscreen use reviewed; healthy diet, including goals of calcium and  vitamin D intake and alcohol recommendations (less than or equal to 1 drink/day) reviewed; regular seatbelt use; changing batteries in smoke detectors.  Immunization recommendations discussed.  Colonoscopy recommendations reviewed   Medicare Attestation I have personally reviewed: The patient's medical and social history Their use of alcohol, tobacco or illicit drugs Their current medications and supplements The patient's functional ability including ADLs,fall risks,  home safety risks, cognitive, and hearing and visual impairment Diet and physical activities Evidence for depression or mood disorders  The patient's weight, height, and BMI have been recorded in the chart.  I have made referrals, counseling, and provided education to the patient based on review of the above and I have provided the patient with a written personalized care plan for preventive services.    Return for Return as Already Scheduled.    Irene Pap, PA-C   02/26/2022

## 2022-02-26 ENCOUNTER — Encounter: Payer: Self-pay | Admitting: Physician Assistant

## 2022-02-26 ENCOUNTER — Ambulatory Visit (INDEPENDENT_AMBULATORY_CARE_PROVIDER_SITE_OTHER): Payer: Medicare Other | Admitting: Physician Assistant

## 2022-02-26 ENCOUNTER — Encounter: Payer: Self-pay | Admitting: Internal Medicine

## 2022-02-26 VITALS — BP 130/70 | HR 56 | Ht 60.25 in | Wt 170.6 lb

## 2022-02-26 DIAGNOSIS — E119 Type 2 diabetes mellitus without complications: Secondary | ICD-10-CM

## 2022-02-26 DIAGNOSIS — I10 Essential (primary) hypertension: Secondary | ICD-10-CM | POA: Diagnosis not present

## 2022-02-26 DIAGNOSIS — G4700 Insomnia, unspecified: Secondary | ICD-10-CM

## 2022-02-26 DIAGNOSIS — Z0000 Encounter for general adult medical examination without abnormal findings: Secondary | ICD-10-CM

## 2022-02-26 LAB — COMPREHENSIVE METABOLIC PANEL
ALT: 18 IU/L (ref 0–32)
AST: 21 IU/L (ref 0–40)
Albumin/Globulin Ratio: 1.6 (ref 1.2–2.2)
Albumin: 4.1 g/dL (ref 3.7–4.7)
Alkaline Phosphatase: 109 IU/L (ref 44–121)
BUN/Creatinine Ratio: 18 (ref 12–28)
BUN: 11 mg/dL (ref 8–27)
Bilirubin Total: 0.2 mg/dL (ref 0.0–1.2)
CO2: 23 mmol/L (ref 20–29)
Calcium: 9.3 mg/dL (ref 8.7–10.3)
Chloride: 105 mmol/L (ref 96–106)
Creatinine, Ser: 0.6 mg/dL (ref 0.57–1.00)
Globulin, Total: 2.6 g/dL (ref 1.5–4.5)
Glucose: 112 mg/dL — ABNORMAL HIGH (ref 70–99)
Potassium: 4.5 mmol/L (ref 3.5–5.2)
Sodium: 143 mmol/L (ref 134–144)
Total Protein: 6.7 g/dL (ref 6.0–8.5)
eGFR: 94 mL/min/{1.73_m2} (ref >59)

## 2022-02-26 LAB — HEMOGLOBIN A1C
Est. average glucose Bld gHb Est-mCnc: 134 mg/dL
Hgb A1c MFr Bld: 6.3 % — ABNORMAL HIGH (ref 4.8–5.6)

## 2022-02-26 MED ORDER — LISINOPRIL 10 MG PO TABS: 10.0000 mg | ORAL_TABLET | Freq: Every day | ORAL | 0 refills | Status: DC

## 2022-02-26 MED ORDER — TRAZODONE HCL 50 MG PO TABS: 25.0000 mg | ORAL_TABLET | Freq: Every evening | ORAL | 5 refills | Status: DC | PRN

## 2022-02-26 MED ORDER — SIMVASTATIN 20 MG PO TABS: 20.0000 mg | ORAL_TABLET | Freq: Every day | ORAL | 0 refills | Status: DC

## 2022-02-26 MED ORDER — METFORMIN HCL 500 MG PO TABS: 500.0000 mg | ORAL_TABLET | Freq: Every day | ORAL | 0 refills | Status: DC

## 2022-03-01 NOTE — Progress Notes (Signed)
Patient speaks Spanish and has family members available that speak [place].  Please call patient to let her know -   Regarding your recent blood work:  Liver and kidneys are fine  Blood sugar levels are fine; continue to eat a low sugar diet, avoid starchy food with a lot of carbohydrates, avoid fried and processed foods

## 2022-04-30 ENCOUNTER — Encounter: Payer: Self-pay | Admitting: Gynecologic Oncology

## 2022-04-30 ENCOUNTER — Other Ambulatory Visit: Payer: Self-pay

## 2022-04-30 ENCOUNTER — Inpatient Hospital Stay: Payer: Medicare Other | Attending: Gynecologic Oncology | Admitting: Gynecologic Oncology

## 2022-04-30 VITALS — BP 126/56 | HR 64 | Temp 98.2°F | Resp 18 | Ht 62.0 in | Wt 167.2 lb

## 2022-04-30 DIAGNOSIS — Z923 Personal history of irradiation: Secondary | ICD-10-CM | POA: Insufficient documentation

## 2022-04-30 DIAGNOSIS — C541 Malignant neoplasm of endometrium: Secondary | ICD-10-CM

## 2022-04-30 DIAGNOSIS — Z90710 Acquired absence of both cervix and uterus: Secondary | ICD-10-CM | POA: Insufficient documentation

## 2022-04-30 DIAGNOSIS — Z8542 Personal history of malignant neoplasm of other parts of uterus: Secondary | ICD-10-CM | POA: Diagnosis not present

## 2022-04-30 DIAGNOSIS — Z90722 Acquired absence of ovaries, bilateral: Secondary | ICD-10-CM | POA: Insufficient documentation

## 2022-04-30 NOTE — Progress Notes (Signed)
Gynecologic Oncology Return Clinic Visit  04/30/22  Reason for Visit: surveillance visit in the setting of advanced stage uterine cancer  Treatment History: Oncology History Overview Note  IHC - loss of MLH1 and PMS2 MLH1 promoter hypermethylation present   Endometrial adenocarcinoma ([place])  07/18/2020 Surgery   D&C: grade 1 EMCA    Initial Diagnosis   Endometrial adenocarcinoma ([place])   08/27/2020 Surgery   Robotic-assisted laparoscopic total hysterectomy with bilateral salpingoophorectomy, SLN biopsy    08/27/2020 Pathology Results   A. SENTINEL LYMPH NODE, RIGHT OBTURATOR, BIOPSY:  - Lymph node, negative for carcinoma (0/1)   B. SENTINEL LYMPH NODE, RIGHT EXTERNAL ILIAC, BIOPSY:  - Lymph node, negative for carcinoma (0/1)   C. 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 UTERUS, CERVIX, BILATERAL TUBES AND OVARIES:  - Endometrioid adenocarcinoma, FIGO grade 2, see comment  - Carcinoma invades for a depth of 0.6 cm with a myometrial thickness is  2.0 cm  - Benign unremarkable cervix  - Benign bilateral fallopian tubes and ovaries  - See oncology table    COMMENT:   C.   Immunostain for pancytokeratin (AE1/AE3) highlights focus of  metastatic carcinoma.   D.  Immunohistochemical stains show that the tumor cells show wild-type  (normal) expression pattern for p53 and are negative for p16, consistent  with above interpretation.  The tumor shows 2 different morphologies  with the majority tumor showing a FIGO grade 1 endometrial  adenocarcinoma and a second component with FIGO grade 2 features.  Dr.  Tresa Moore has reviewed part C and D and concurs with the diagnosis.  Dr.  Berline Lopes was notified on 09/02/2020.      ONCOLOGY TABLE:   UTERUS, CARCINOMA OR CARCINOSARCOMA: Resection   Procedure: Total hysterectomy and bilateral salpingo-oophorectomy  Histologic Type: Endometrioid adenocarcinoma  Histologic  Grade: FIGO grade 2  Myometrial Invasion:       Depth of Myometrial Invasion (mm): 6 mm       Myometrial Thickness (mm): 20 mm       Percentage of Myometrial Invasion: Less than 50%  Uterine Serosa Involvement: Not identified  Cervical stromal Involvement: Not identified  Extent of involvement of other tissue/organs: Not identified  Peritoneal/Ascitic Fluid: Not applicable  Lymphovascular Invasion: Not identified  Regional Lymph Nodes:       Pelvic Lymph Nodes Examined:  3 Sentinel             0 Non-sentinel             3 Total       Pelvic Lymph Nodes with Metastasis: 1             Macrometastasis: (>2.0 mm): 0             Micrometastasis: (>0.2 mm and < 2.0 mm): 1             Isolated Tumor Cells (<0.2 mm): 0             Laterality of Lymph Node with Tumor: Left             Extracapsular Extension: Not identified       Para-aortic Lymph Nodes Examined:              0 Sentinel              0 Non-sentinel              0 Total  Distant Metastasis:       Distant Site(s) Involved: Not applicable  Pathologic Stage Classification (pTNM, AJCC 8th Edition): pT1a, pN84mi  Ancillary Studies: MMR / MSI testing will be ordered  Representative Tumor Block: D5  Comment(s): Pancytokeratin was performed on the lymph nodes.    08/27/2020 Cancer Staging   Staging form: Corpus Uteri - Carcinoma and Carcinosarcoma, AJCC 8th Edition - Clinical stage from 08/27/2020: FIGO Stage IIIC1 (cT1a, cN41mi, cM0) - Signed by Carver Fila, MD on 09/03/2020   10/15/2020 - 12/09/2020 Radiation Therapy   Radiation treatment dates:    IMRT: 10/15/20 - 11/18/20 HDR: 3/31, 4/5, 12/09/20   Site/dose:    IMRT: Pelvis / 45 Gy in 25 Gy HDR: Vaginal Cylinder, 2.5 cm / 18 Gy in 3 fractions   Beams/energy:    IMRT: 6X HDR: Ir-192, 3.0 cm length, 1 channel and 7 dwells     Interval History: Doing well.  Denies any abdominal or pelvic pain.  Notes will have some mild pain overnight if her bladder gets too full  and she needs to void.  Has intermittent chills during the afternoon.  Denies any vaginal bleeding or discharge.  Reports good appetite without nausea or emesis.  Endorses normal bowel function.  Past Medical/Surgical History: Past Medical History:  Diagnosis Date   DM type 2 (diabetes mellitus, type 2) (HCC)    Elevated ALT measurement 04/18/2018   resolved   Endometrial cancer [facility])    History of radiation therapy 10/15/20-11/18/20   IMRT endometrium - Dr. Antony Blackbird   History of radiation therapy 11/27/2020, 12/02/2020, 12/09/2020   vaginal brachytherapy - HDR to endometrium      Dr Antony Blackbird   Hyperlipidemia    Hypertension    Obesity    Osteopenia determined by x-ray    PMB (postmenopausal bleeding)    SVD (spontaneous vaginal delivery)    x 8    Past Surgical History:  Procedure Laterality Date   DILATATION & CURETTAGE/HYSTEROSCOPY WITH MYOSURE N/A 07/18/2020   Procedure: DILATATION & CURETTAGE/HYSTEROSCOPY WITH MYOSURE;  Surgeon: Genia Del, MD;  Location: [facility];  Service: Gynecology;  Laterality: N/A;   DILATION AND CURETTAGE OF UTERUS N/A 01/25/2014   Procedure: DILATATION AND CURETTAGE;  Surgeon: Allie Bossier, MD;  Location: [facility];  Service: Gynecology;  Laterality: N/A;   MULTIPLE TOOTH EXTRACTIONS  yrs ago   ROBOTIC ASSISTED TOTAL HYSTERECTOMY WITH BILATERAL SALPINGO OOPHERECTOMY N/A 08/27/2020   Procedure: XI ROBOTIC ASSISTED TOTAL HYSTERECTOMY WITH BILATERAL SALPINGO OOPHORECTOMY,;  Surgeon: Carver Fila, MD;  Location: [facility];  Service: Gynecology;  Laterality: N/A;   SENTINEL NODE BIOPSY N/A 08/27/2020   Procedure: SENTINEL LYMPH NODE BIOPSY;  Surgeon: Carver Fila, MD;  Location: [facility];  Service: Gynecology;  Laterality: N/A;   TUBAL LIGATION  yrs ago    Family History  Problem Relation Age of Onset   Hypertension Mother    Alcohol abuse Father    Anemia Brother    Alcohol abuse Brother    Breast cancer Cousin         maternal cousin   Colon cancer Neg Hx    Esophageal cancer Neg Hx    Rectal cancer Neg Hx    Stomach cancer Neg Hx    Ovarian cancer Neg Hx     Social History   Socioeconomic History   Marital status: Divorced    Spouse name: Not on file   Number of children: Not on file  Years of education: Not on file   Highest education level: Not on file  Occupational History   Not on file  Tobacco Use   Smoking status: Never   Smokeless tobacco: Never  Vaping Use   Vaping Use: Never used  Substance and Sexual Activity   Alcohol use: No   Drug use: No   Sexual activity: Not Currently    Birth control/protection: Post-menopausal  Other Topics Concern   Not on file  Social History Narrative   Not on file   Social Determinants of Health   Financial Resource Strain: Low Risk  (08/14/2021)   Overall Financial Resource Strain (CARDIA)    Difficulty of Paying Living Expenses: Not hard at all  Food Insecurity: No Food Insecurity (08/14/2021)   Hunger Vital Sign    Worried About Running Out of Food in the Last Year: Never true    Ran Out of Food in the Last Year: Never true  Transportation Needs: No Transportation Needs (08/14/2021)   PRAPARE - Hydrologist (Medical): No    Lack of Transportation (Non-Medical): No  Physical Activity: Insufficiently Active (08/14/2021)   Exercise Vital Sign    Days of Exercise per Week: 5 days    Minutes of Exercise per Session: 20 min  Stress: No Stress Concern Present (08/14/2021)   [place]    Feeling of Stress : Not at all  Social Connections: Not on file    Current Medications:  Current Outpatient Medications:    acetaminophen (TYLENOL) 500 MG tablet, Take 500-1,000 mg by mouth every 6 (six) hours as needed (pain.)., Disp: , Rfl:    Cholecalciferol (VITAMIN D3) 25 MCG (1000 UT) CAPS, Take 1,000 Units by mouth daily., Disp: , Rfl:    ibuprofen  (ADVIL) 200 MG tablet, Take 600 mg by mouth every 8 (eight) hours as needed (pain.)., Disp: , Rfl:    lisinopril (ZESTRIL) 10 MG tablet, Take 1 tablet (10 mg total) by mouth daily., Disp: 90 tablet, Rfl: 0   metFORMIN (GLUCOPHAGE) 500 MG tablet, Take 1 tablet (500 mg total) by mouth daily with breakfast., Disp: 90 tablet, Rfl: 0   simvastatin (ZOCOR) 20 MG tablet, Take 1 tablet (20 mg total) by mouth at bedtime., Disp: 90 tablet, Rfl: 0   traZODone (DESYREL) 50 MG tablet, Take 0.5 tablets (25 mg total) by mouth at bedtime as needed for sleep., Disp: 30 tablet, Rfl: 5  Review of Systems: Denies appetite changes, fevers, chills, fatigue, unexplained weight changes. Denies hearing loss, neck lumps or masses, mouth sores, ringing in ears or voice changes. Denies cough or wheezing.  Denies shortness of breath. Denies chest pain or palpitations. Denies leg swelling. Denies abdominal distention, pain, blood in stools, constipation, diarrhea, nausea, vomiting, or early satiety. Denies pain with intercourse, dysuria, frequency, hematuria or incontinence. Denies hot flashes, pelvic pain, vaginal bleeding or vaginal discharge.   Denies joint pain, back pain or muscle pain/cramps. Denies itching, rash, or wounds. Denies dizziness, headaches, numbness or seizures. Denies swollen lymph nodes or glands, denies easy bruising or bleeding. Denies anxiety, depression, confusion, or decreased concentration.  Physical Exam: BP (!) 126/56 (BP Location: Left Arm, Patient Position: Sitting)   Pulse 64   Temp 98.2 F (36.8 C) (Oral)   Resp 18   Ht $R'5\' 2"'PT$  (1.575 m)   Wt 167 lb 3.2 oz (75.8 kg)   SpO2 98%   BMI 30.58 kg/m  General: Alert, oriented,  no acute distress. HEENT: Normocephalic, atraumatic, sclera anicteric. Chest: Clear to auscultation bilaterally.  No wheezes or rhonchi. Cardiovascular: Regular rate and rhythm, no murmurs. Abdomen: soft, nontender.  Normoactive bowel sounds.  No masses or  hepatosplenomegaly appreciated.  Well-healed incisions. Extremities: Grossly normal range of motion.  Warm, well perfused.  No edema bilaterally. Skin: No rashes or lesions noted. Lymphatics: No cervical, supraclavicular, or inguinal adenopathy. GU: Normal appearing external genitalia without erythema, excoriation, or lesions.  Speculum exam reveals moderately atrophic vaginal mucosa, radiation changes noted. No masses or lesions appreciated.  Bimanual exam reveals cuff intact, no nodularity or masses.  Rectovaginal exam confirms these findings.  Laboratory & Radiologic Studies: None new  Assessment & Plan: Julie Orozco is a 76 y.o. 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 IHC loss of MLH1/PMS, MLH1 promoter hypermethylation present.   Patient is overall doing well and is NED on exam.   Her prior urinary symptoms have completely resolved.   We discussed signs and symptoms that would be concerning for disease recurrence, and the patient knows to call if she develops any of these between visits.   Given her high risk disease, we will continue with visits every 3 months. She is scheduled to see Dr. Sondra Come for follow-up in December. I asked her to call my office after the new year to schedule a visit for March.   20 minutes of total time was spent for this patient encounter, including preparation, face-to-face counseling with the patient and coordination of care, and documentation of the encounter.  Jeral Pinch, MD  Division of Gynecologic Oncology  Department of Obstetrics and Gynecology  [facility]

## 2022-04-30 NOTE — Patient Instructions (Signed)
It was good to see you today.  I do not see or feel any evidence of cancer on your exam.  Your next visit will be with radiation oncology in 3 months (December).  Please call my office sometime in January to schedule a visit to see me in March.  As always, please call if you develop any new and concerning symptoms such as vaginal bleeding, pelvic pain, or unintentional weight loss.

## 2022-05-05 ENCOUNTER — Encounter: Payer: Self-pay | Admitting: Internal Medicine

## 2022-06-05 LAB — HM DIABETES EYE EXAM

## 2022-06-08 ENCOUNTER — Encounter: Payer: Self-pay | Admitting: Internal Medicine

## 2022-06-16 ENCOUNTER — Encounter: Payer: Self-pay | Admitting: Internal Medicine

## 2022-06-27 ENCOUNTER — Other Ambulatory Visit: Payer: Self-pay | Admitting: Physician Assistant

## 2022-06-30 ENCOUNTER — Ambulatory Visit (INDEPENDENT_AMBULATORY_CARE_PROVIDER_SITE_OTHER): Payer: Medicare Other | Admitting: Medical

## 2022-06-30 VITALS — BP 128/70 | HR 69 | Temp 97.2°F | Wt 168.6 lb

## 2022-06-30 DIAGNOSIS — Z7185 Encounter for immunization safety counseling: Secondary | ICD-10-CM | POA: Diagnosis not present

## 2022-06-30 DIAGNOSIS — B029 Zoster without complications: Secondary | ICD-10-CM | POA: Diagnosis not present

## 2022-06-30 MED ORDER — GABAPENTIN 100 MG PO CAPS: 100.0000 mg | ORAL_CAPSULE | Freq: Two times a day (BID) | ORAL | 0 refills | Status: DC

## 2022-06-30 MED ORDER — HYDROCORTISONE VALERATE 0.2 % EX CREA: 1.0000 | TOPICAL_CREAM | Freq: Two times a day (BID) | CUTANEOUS | 0 refills | Status: DC

## 2022-06-30 NOTE — Progress Notes (Signed)
Subjective:  Julie Orozco is a 76 y.o. female who presents for Chief Complaint  Patient presents with   bumps on lower body    Bumps/blisters that are leg and will move around to front of groin, very painful since last Wednesday, never had shingles outbreak before     Here for rash x 1+ week.  Here with daughter who translates.  Rash started as redness and blisters on left lower buttock going into the ear in her upper thigh as well.  She had blisters that have been painful for several days.  Now she has some bumps that are drying up but is still painful.  No fever, no body aches or chills.  No prior shingles.  No household contacts with similar.  No other aggravating or relieving factors.    No other c/o.  The following portions of the patient's history were reviewed and updated as appropriate: allergies, current medications, past family history, past medical history, past social history, past surgical history and problem list.  ROS Otherwise as in subjective above  Objective: BP 128/70   Pulse 69   Temp (!) 97.2 F (36.2 C)   Wt 168 lb 9.6 oz (76.5 kg)   BMI 30.84 kg/m   General appearance: alert, no distress, well developed, well nourished Skin: There are several scattered pinkish-brown round 3 mm or less flaccid papular lesions along the left inferior buttock wrapping around the inner upper thigh anteriorly as well in an S2 dermatome suggestive of shingles.  No current blisters as they seem to be drying up   Assessment: Encounter Diagnoses  Name Primary?   Herpes zoster without complication Yes   Vaccine counseling      Plan: We discussed symptoms and concerns and exam findings suggest shingles.  She had chickenpox as a child.  She has never had the vaccine.  Discussed the symptoms, exam findings, suggestive of shingles outbreak.  discussed transmission, precautions to prevent spread to others, particularly high risk groups as discussed including young children,  elderly, immunocompromised people, or pregnant women.  Discussed treatment including relative rest, hydration.   Too late for Valtrex this time.  Can use cream below  topically but don't let others use the cream and discard the cream after this episode of shingles.   Begin gabapentin for 3-4 weeks.  Discussed proper use of medicaiton, risks/benefits of medicaiton.  Discussed the possibility of post herpetic neuralgia.   answered their questions, After visit summary given.   Julie Orozco was seen today for bumps on lower body.  Diagnoses and all orders for this visit:  Herpes zoster without complication  Vaccine counseling  Other orders -     gabapentin (NEURONTIN) 100 MG capsule; Take 1 capsule (100 mg total) by mouth 2 (two) times daily. -     hydrocortisone valerate cream (WESTCORT) 0.2 %; Apply 1 Application topically 2 (two) times daily.   Follow up: soon for well visit

## 2022-06-30 NOTE — Patient Instructions (Signed)
Culebrilla Shingles  La culebrilla, tambin conocida como herpes zster, es una infeccin que causa una erupcin cutnea dolorosa y ampollas llenas de lquido. La causa un virus. La culebrilla solo se Nutritional therapist que: Han tenido varicela. Se han vacunado contra la varicela. La culebrilla es poco frecuente en ese grupo. Cules son las causas? El virus de la varicela zster ocasiona la culebrilla. Este es el mismo virus que causa la varicela. Despus de la exposicin al virus, este permanece en el cuerpo en un estado inactivo (latente). La culebrilla se desarrolla si el virus se reactiva. Esto puede ocurrir muchos aos despus de la primera exposicin (inicial) al virus. No se sabe qu causa la reactivacin de este virus. Qu incrementa el riesgo? Las personas que han tenido varicela o han recibido la vacuna contra la varicela, estn en riesgo de tener culebrilla. La infeccin de la culebrilla es ms frecuente en las personas que: Son [place] de 62 aos de [place]. Tienen debilitado el sistema que combate las enfermedades (sistema inmunitario), por ejemplo, las personas que tienen: VIH (virus de inmunodeficiencia Primera). SIDA (sndrome de inmunodeficiencia adquirida). Cncer. Reciben medicamentos que debilitan el sistema inmunitario, como los medicamentos que se indican en caso de trasplantes de rganos. Est experimentando mucho estrs. Cules son los signos o sntomas? Los sntomas tempranos de esta afeccin incluyen picazn, hormigueo y Social research officer, government en un rea de la piel. Este dolor se puede describir como ardor, punzante o pulstil. Unos das o semanas despus de que comienzan los primeros sntomas, aparece una erupcin cutnea rojiza y dolorosa. La erupcin generalmente aparece en un lado del cuerpo en un patrn de bandas o semejante a una franja. Con el tiempo, la erupcin cutnea se convierte en ampollas llenas de lquido que se rompen, se transforman en costras y se secan en el trmino  de 2 a 3 semanas. En cualquier momento durante la infeccin, usted tambin puede presentar: Cristy Hilts. Escalofros. Dolor de [place]. Nuseas. Cmo se diagnostica? Esta afeccin se diagnostica con un examen cutneo. Es posible que se extraigan muestras de piel o lquido (un cultivo) de las ampollas antes de Optometrist un diagnstico. Cmo se trata? La erupcin cutnea puede durar varias semanas. No hay una cura especfica para esta afeccin. Es posible que el mdico le recete medicamentos para ayudarlo a Financial controller, recuperarse ms rpidamente y Product/process development scientist problemas a Barrister's clerk. Los medicamentos pueden incluir los siguientes: Medicamentos antivirales. Medicamentos antiinflamatorios. Analgsicos. Medicamentos para Barrister's clerk (antihistamnicos). Si la zona afectada est en el rostro, se lo puede derivar a un especialista, como un mdico especialista en ojos (oftalmlogo) o en nariz, garganta y odos (otorrinolaringlogo) para ayudarlo a Product/process development scientist problemas oculares, dolor crnico o discapacidad. Siga estas instrucciones en su casa: Medicamentos Use los medicamentos de venta libre y los recetados solamente como se lo haya indicado el mdico. Aplique una crema para calmar la picazn o cremas anestsicas en la zona afectada segn las indicaciones del mdico. Para aliviar la picazn y las molestias  Aplique paos hmedos y fros (compresas fras) sobre la zona de la erupcin cutnea o las ampollas siguiendo las indicaciones del mdico. [place] baos con agua fra pueden brindar alivio. Pruebe agregar bicarbonato de sodio o avena seca en el agua para reducir la picazn. No se bae con agua caliente. Use locin de calamina segn se lo haya recomendado el mdico. Se trata de una locin de venta libre que ayuda a Barrister's clerk. Cuidado de las ampollas y la erupcin cutnea Mantenga la zona  de la erupcin cutnea cubierta con una venda floja (vendaje). Use ropa holgada para ayudar a Psychologist, prison and probation services provocado por el roce con la erupcin. Lvese las manos con agua y jabn durante al menos 20 segundos antes y despus de Quarry manager el vendaje. Use desinfectante para manos si no dispone de [place] y [place]. Cambie el vendaje como se lo haya indicado el mdico. Mantenga la erupcin y las ampollas limpias lavando la zona con [place] y agua fra segn las indicaciones del mdico. Verifique la erupcin cutnea todos los das para detectar signos de infeccin. Est atento a los siguientes signos: Aumento del enrojecimiento, la hinchazn o Conservation officer, historic buildings. Lquido o sangre. Calor. Pus o mal olor. No se rasque en la zona de la erupcin ni se toque las ampollas. Para ayudar a evitar rascarse: Tenga las uas siempre cortas y limpias. Use guantes o mitones mientras duerme, si no puede dejar de rascarse. Instrucciones generales Haga reposo como se lo haya indicado el mdico. Lvese las manos regularmente con agua y jabn durante al menos 20 segundos. Use desinfectante para manos si no dispone de [place] y [place]. Al hacerlo, reduce sus probabilidades de contraer una infeccin bacteriana en la piel. Antes de que se forme una [employer], la infeccin de la culebrilla puede causar varicela en las personas que nunca la tuvieron o nunca se vacunaron contra la varicela. Para impedir que esto ocurra, evite el contacto con [employer], especialmente: Los bebs. Las [employer]. Los nios con eczema. Las personas de edad avanzada que tienen trasplantes. Las personas con enfermedades crnicas, como cncer o sndrome de inmunodeficiencia adquirida (SIDA). Concurra a [place]. Esto es importante. Cmo se evita? Vacunarse es la mejor forma de prevenir la culebrilla y de protegerse contra las complicaciones de la [place]. Si no se ha vacunado, hable con su mdico sobre la posibilidad de recibir la vacuna. Dnde buscar ms informacin Centers for Disease Control and  Prevention (Centros para el Control y la Prevencin de Arboriculturist): http://www.wolf.info/ Comunquese con un mdico si: El dolor no se Engineer, production con los Haematologist. El dolor no mejora despus de que se cura la erupcin cutnea. Tiene cualquiera de estos signos de infeccin: Aumento del enrojecimiento, la hinchazn o Conservation officer, historic buildings alrededor de la erupcin. Presenta lquido o sangre que supura de la erupcin. Calor que proviene de la erupcin. Advierte pus o mal olor que sale de la erupcin. Cristy Hilts. Solicite ayuda de inmediato si: La erupcin cutnea est en el rostro o en la nariz. Tiene dolor en el rostro, en la zona de los ojos o presenta prdida de sensibilidad en un lado del rostro. Tiene dificultad para ver. Siente dolor o zumbido en los odos. Presenta prdida del gusto. Su afeccin empeora. Resumen La culebrilla, que tambin se conoce como herpes zster, es una infeccin que causa una erupcin cutnea dolorosa y ampollas llenas de lquido. Esta afeccin se diagnostica con un examen cutneo. Es posible que se extraigan muestras de piel o lquido (un cultivo) de las ampollas. Mantenga la zona de la erupcin cutnea cubierta con una venda floja (vendaje). Use ropa holgada para ayudar a Best boy provocado por el roce con la erupcin. Antes de que se forme una [employer], la infeccin de la culebrilla puede causar varicela en las personas que nunca la tuvieron o nunca se vacunaron contra la varicela. Esta informacin no tiene Marine scientist el consejo del mdico. Asegrese de hacerle  al mdico cualquier pregunta que tenga. Document Revised: 09/05/2020 Document Reviewed: 09/05/2020 Elsevier Patient Education  [place].

## 2022-07-01 ENCOUNTER — Other Ambulatory Visit: Payer: Self-pay | Admitting: Physician Assistant

## 2022-07-13 ENCOUNTER — Encounter: Payer: Self-pay | Admitting: Internal Medicine

## 2022-07-13 ENCOUNTER — Other Ambulatory Visit: Payer: Self-pay | Admitting: Medical

## 2022-08-08 NOTE — Progress Notes (Signed)
Radiation Oncology         [phone] ________________________________  Name: Julie Orozco MRN: [id number]  Date: 08/09/2022  DOB: 1945/10/28  Follow-Up Visit Note  CC: Irene Pap, PA-C  [place], Vickie L, NP-C  No diagnosis found.  Diagnosis:  Stage III endometrioid adenocarcinoma (pT1a, pN90m, M0)   Interval Since Last Radiation: 1 year, 7 months, and 29 days   Radiation treatment dates:    IMRT: 10/15/20 - 11/18/20 HDR: 3/31, 4/5, 12/09/20   Site/dose:    IMRT: Pelvis / 45 Gy in 25 Gy HDR: Vaginal Cylinder, 2.5 cm / 18 Gy in 3 fractions   Beams/energy:    IMRT: 6X HDR: Ir-192, 3.0 cm length, 1 channel and 7 dwells  Narrative:  The patient returns today for routine 6 month follow-up, she was last seen here for follow-up on 02/04/22. Since her last visit, the patient followed up with Dr. TBerline Lopeson 04/30/22. During which time, the patient reported occasional mild bladder pain associated with bladder fullness at night, and intermittent chills during the afternoon. She otherwise denied any symptoms concerning for disease recurrence. Her prior urinary symptoms have otherwise completely resolved.           Otherwise, no significant interval history since the patient was last seen for follow-up.   ***                      Allergies:  has No Known Allergies.  Meds: Current Outpatient Medications  Medication Sig Dispense Refill   acetaminophen (TYLENOL) 500 MG tablet Take 500-1,000 mg by mouth every 6 (six) hours as needed (pain.).     Cholecalciferol (VITAMIN D3) 25 MCG (1000 UT) CAPS Take 1,000 Units by mouth daily.     gabapentin (NEURONTIN) 100 MG capsule Take 1 capsule (100 mg total) by mouth 2 (two) times daily. 45 capsule 0   hydrocortisone valerate cream (WESTCORT) 0.2 % Apply 1 Application topically 2 (two) times daily. 45 g 0   ibuprofen (ADVIL) 200 MG tablet Take 600 mg by mouth every 8 (eight) hours as needed (pain.).     lisinopril (ZESTRIL) 10 MG  tablet Take 1 tablet (10 mg total) by mouth daily. 90 tablet 0   metFORMIN (GLUCOPHAGE) 500 MG tablet Take 1 tablet (500 mg total) by mouth daily with breakfast. 90 tablet 0   simvastatin (ZOCOR) 20 MG tablet TAKE 1 TABLET BY MOUTH AT BEDTIME 90 tablet 0   traZODone (DESYREL) 50 MG tablet Take 0.5 tablets (25 mg total) by mouth at bedtime as needed for sleep. 30 tablet 5   No current facility-administered medications for this encounter.    Physical Findings: The patient is in no acute distress. Patient is alert and oriented.  vitals were not taken for this visit. .  No significant changes. Lungs are clear to auscultation bilaterally. Heart has regular rate and rhythm. No palpable cervical, supraclavicular, or axillary adenopathy. Abdomen soft, non-tender, normal bowel sounds.  On pelvic examination the external genitalia were unremarkable. A speculum exam was performed. There are no mucosal lesions noted in the vaginal vault. A Pap smear was obtained of the proximal vagina. On bimanual and rectovaginal examination there were no pelvic masses appreciated. ***    Lab Findings: Lab Results  Component Value Date   WBC 5.4 04/28/2021   HGB 12.7 04/28/2021   HCT 37.9 04/28/2021   MCV 92 04/28/2021   PLT 232 04/28/2021    Radiographic Findings: No results found.  Impression: Stage III endometrioid adenocarcinoma (pT1a, pN44m, M0)   The patient is recovering from the effects of radiation.  ***  Plan:  ***   *** minutes of total time was spent for this patient encounter, including preparation, face-to-face counseling with the patient and coordination of care, physical exam, and documentation of the encounter. ____________________________________  JBlair Promise PhD, MD  This document serves as a record of services personally performed by JGery Pray MD. It was created on his behalf by ERoney Mans a trained medical scribe. The creation of this record is based on the scribe's  personal observations and the provider's statements to them. This document has been checked and approved by the attending provider.

## 2022-08-09 ENCOUNTER — Ambulatory Visit
Admission: RE | Admit: 2022-08-09 | Discharge: 2022-08-09 | Disposition: A | Discharge status: Home / Self Care | Payer: Medicare Other | Source: Ambulatory Visit | Attending: Radiation Oncology | Admitting: Radiation Oncology

## 2022-08-09 ENCOUNTER — Encounter: Payer: Self-pay | Admitting: Radiation Oncology

## 2022-08-09 VITALS — BP 140/71 | HR 74 | Temp 96.1°F | Resp 18 | Ht 62.0 in | Wt 165.4 lb

## 2022-08-09 DIAGNOSIS — R300 Dysuria: Secondary | ICD-10-CM | POA: Insufficient documentation

## 2022-08-09 DIAGNOSIS — Z8542 Personal history of malignant neoplasm of other parts of uterus: Secondary | ICD-10-CM | POA: Insufficient documentation

## 2022-08-09 DIAGNOSIS — Z923 Personal history of irradiation: Secondary | ICD-10-CM | POA: Diagnosis not present

## 2022-08-09 DIAGNOSIS — B3731 Acute candidiasis of vulva and vagina: Secondary | ICD-10-CM

## 2022-08-09 DIAGNOSIS — C541 Malignant neoplasm of endometrium: Secondary | ICD-10-CM | POA: Diagnosis not present

## 2022-08-09 HISTORY — DX: Dysuria: R30.0

## 2022-08-09 LAB — URINALYSIS, COMPLETE (UACMP) WITH MICROSCOPIC
Bilirubin Urine: NEGATIVE
Glucose, UA: NEGATIVE mg/dL
Ketones, ur: NEGATIVE mg/dL
Nitrite: NEGATIVE
Protein, ur: NEGATIVE mg/dL
Specific Gravity, Urine: 1.017 (ref 1.005–1.030)
pH: 5 (ref 5.0–8.0)

## 2022-08-09 NOTE — Progress Notes (Signed)
Julie Orozco is here today for follow up post radiation to the pelvic.  They completed their radiation on:  12/09/20  Does the patient complain of any of the following:  Pain: None Abdominal bloating: She reports occasional bloating.  Denies cramping. Diarrhea/Constipation: Denies diarrhea and constipation. Nausea/Vomiting: No Vaginal Discharge: No Blood in Urine or Stool: Denies. Urinary Issues (dysuria/incomplete emptying/ incontinence/ increased frequency/urgency): She reports she has some urinary frequency, states she does not feel that she is completely emptying her bladder.  She voids a little at a time. Does patient report using vaginal dilator 2-3 times a week and/or sexually active 2-3 weeks: She reports she has not been using her dilators regularly.  She reports she had a shingles outbreak in her groin and right leg and she did not use her dilators during that time.  She last used the dilator about 4 days and noted some bleeding.  She reports she used the lubricating jelly for insertion.  Post radiation skin changes: No   Additional comments if applicable: -She was seen by her Doctor for the shingles.  Per daughter by the time she was seen by the doctor the lesions had started to dry out.  She was given some cream and pain medications.

## 2022-08-10 LAB — URINE CULTURE: Culture: NO GROWTH

## 2022-08-20 ENCOUNTER — Ambulatory Visit: Payer: Medicare Other

## 2022-09-15 ENCOUNTER — Encounter: Payer: Self-pay | Admitting: Gastroenterology

## 2022-09-23 ENCOUNTER — Encounter: Payer: Self-pay | Admitting: Nurse Practitioner

## 2022-09-23 ENCOUNTER — Telehealth (INDEPENDENT_AMBULATORY_CARE_PROVIDER_SITE_OTHER): Payer: Medicare Other | Admitting: Nurse Practitioner

## 2022-09-23 VITALS — Ht 60.0 in | Wt 166.0 lb

## 2022-09-23 DIAGNOSIS — J069 Acute upper respiratory infection, unspecified: Secondary | ICD-10-CM

## 2022-09-23 MED ORDER — PROMETHAZINE-DM 6.25-15 MG/5ML PO SYRP: 2.5000 mL | ORAL_SOLUTION | Freq: Four times a day (QID) | ORAL | 0 refills | Status: DC | PRN

## 2022-09-23 MED ORDER — PREDNISONE 20 MG PO TABS: 40.0000 mg | ORAL_TABLET | Freq: Every day | ORAL | 0 refills | Status: DC

## 2022-09-23 MED ORDER — BENZONATATE 200 MG PO CAPS: 200.0000 mg | ORAL_CAPSULE | Freq: Three times a day (TID) | ORAL | 0 refills | Status: DC | PRN

## 2022-09-23 MED ORDER — ALBUTEROL SULFATE HFA 108 (90 BASE) MCG/ACT IN AERS: 2.0000 | INHALATION_SPRAY | Freq: Four times a day (QID) | RESPIRATORY_TRACT | 0 refills | Status: DC | PRN

## 2022-09-23 NOTE — Patient Instructions (Addendum)
I have sent in medication for your symptoms-  Prednisone - take once a day in the morning Tessalon Perles- for cough  Promethazine-dextromethorphan- for cough - can make you sleepy Albuterol- for wheezing or shortness of breath.   If you still feel bad or your symptoms get better then worse again, please send me a message and let me know and we can start an antibiotic.

## 2022-09-23 NOTE — Progress Notes (Signed)
Virtual Visit Encounter mychart visit.   I connected with  Julie Orozco on 10/05/22 at 10:30 AM EST by secure video and audio telemedicine application. I verified that I am speaking with the correct person using two identifiers.   I introduced myself as a Designer, jewellery with the practice. The limitations of evaluation and management by telemedicine discussed with the patient and the availability of in person appointments. The patient expressed verbal understanding and consent to proceed.  Participating parties in this visit include: Myself and patient  The patient is: Patient Location: Home I am: Provider Location: Office/Clinic Subjective:    CC and HPI: Julie Orozco is a 77 y.o. year old female presenting for new evaluation and treatment of URI  Patient reports the following: Tuesday she started with symptoms of cough and sore throat and later began to develop headache, rhinorrhea, nausea, vomiting, increased mucous production, fever and chills. In general she is feeling very bad at this time. She is no longer vomiting and has been able to hold down liquids. She has not tested for COVID infection.   Past medical history, Surgical history, Family history not pertinant except as noted below, Social history, Allergies, and medications have been entered into the medical record, reviewed, and corrections made.   Review of Systems:  All review of systems negative except what is listed in the HPI  Objective:    Alert and oriented x 4 Ill appearing older female with interpreter present on site.  Speaking in clear sentences with no shortness of breath. No distress.  Impression and Recommendations:    Problem List Items Addressed This Visit     Upper respiratory tract infection - Primary    Patient symptoms consistent with upper respiratory infection of unknown etiology.  Given the presence of nausea and vomiting I am suspicious for possible COVID-19 infection as this  has been presenting symptoms with some patients.  She will plan to test later and notify us if the results are positive.  At this time we will plan to begin treatment with supportive therapy.  She tells me that her cough and congestion are the worst symptom at this time.  Medication sent to pharmacy with instructions provided.  If the patient does test positive for COVID we can send Paxlovid in for treatment.  She has been instructed to follow-up if her symptoms worsen or fail to improve.  She is aware of alarm symptoms that would warrant immediate evaluation in the emergency room.      Relevant Medications   predniSONE (DELTASONE) 20 MG tablet   benzonatate (TESSALON) 200 MG capsule   promethazine-dextromethorphan (PROMETHAZINE-DM) 6.25-15 MG/5ML syrup   albuterol (VENTOLIN HFA) 108 (90 Base) MCG/ACT inhaler    orders and follow up as documented in EMR I discussed the assessment and treatment plan with the patient. The patient was provided an opportunity to ask questions and all were answered. The patient agreed with the plan and demonstrated an understanding of the instructions.   The patient was advised to call back or seek an in-person evaluation if the symptoms worsen or if the condition fails to improve as anticipated.  Follow-Up: prn  I provided 28 minutes of non-face-to-face interaction with this non face-to-face encounter including intake, same-day documentation, and chart review.   Orma Render, NP , DNP, AGNP-c [facility]

## 2022-09-26 ENCOUNTER — Other Ambulatory Visit: Payer: Self-pay | Admitting: Medical

## 2022-10-01 ENCOUNTER — Telehealth: Payer: Self-pay | Admitting: *Deleted

## 2022-10-01 NOTE — Telephone Encounter (Signed)
CALLED PATIENT'S DAUGHTER- VIOLETTA TO INFORM OF FU APPT. WITH DR. Berline Lopes ON 12-09-22- ARRIVAL TIME- 1:30. PM, SPOKE WITH PATIENT'S DAUGHTER VIOLETTA AND SHE IS AWARE OF THIS APPT.

## 2022-10-01 NOTE — Telephone Encounter (Signed)
Enid Derry from (RAD ONC) called office.  Pt is scheduled for a follow up on 12-09-22  at 1:45 pm with Dr Berline Lopes.  Enid Derry to notify pt of appointment date and time.

## 2022-10-05 DIAGNOSIS — J069 Acute upper respiratory infection, unspecified: Secondary | ICD-10-CM | POA: Insufficient documentation

## 2022-10-05 HISTORY — DX: Acute upper respiratory infection, unspecified: J06.9

## 2022-10-05 NOTE — Assessment & Plan Note (Signed)
Patient symptoms consistent with upper respiratory infection of unknown etiology.  Given the presence of nausea and vomiting I am suspicious for possible COVID-19 infection as this has been presenting symptoms with some patients.  She will plan to test later and notify us if the results are positive.  At this time we will plan to begin treatment with supportive therapy.  She tells me that her cough and congestion are the worst symptom at this time.  Medication sent to pharmacy with instructions provided.  If the patient does test positive for COVID we can send Paxlovid in for treatment.  She has been instructed to follow-up if her symptoms worsen or fail to improve.  She is aware of alarm symptoms that would warrant immediate evaluation in the emergency room.

## 2022-10-21 ENCOUNTER — Telehealth: Payer: Self-pay | Admitting: Nurse Practitioner

## 2022-10-21 ENCOUNTER — Other Ambulatory Visit: Payer: Self-pay

## 2022-10-21 MED ORDER — LISINOPRIL 10 MG PO TABS: 10.0000 mg | ORAL_TABLET | Freq: Every day | ORAL | 1 refills | Status: DC

## 2022-10-21 NOTE — Telephone Encounter (Signed)
Pt's daughter called and requested refills on lisinopril. Please send to [facility] on [address].

## 2022-12-08 ENCOUNTER — Other Ambulatory Visit: Payer: Self-pay | Admitting: Physician Assistant

## 2022-12-08 NOTE — Patient Instructions (Signed)
It was good to see you today.  I do not see or feel any evidence of cancer recurrence on your exam.  I will see you for follow-up in 6 months.  As always, if you develop any new and concerning symptoms before your next visit, please call to see me sooner.   

## 2022-12-08 NOTE — Progress Notes (Unsigned)
Patient was no show for appt  

## 2022-12-09 ENCOUNTER — Inpatient Hospital Stay: Payer: Medicare Other | Attending: Gynecologic Oncology | Admitting: Gynecologic Oncology

## 2022-12-09 DIAGNOSIS — C541 Malignant neoplasm of endometrium: Secondary | ICD-10-CM

## 2022-12-24 ENCOUNTER — Telehealth: Payer: Self-pay | Admitting: *Deleted

## 2022-12-24 NOTE — Telephone Encounter (Signed)
Patient's daughter called and rescheduled her missed appt from 4/11. Appt on 5/24

## 2023-01-03 ENCOUNTER — Other Ambulatory Visit: Payer: Self-pay

## 2023-01-03 ENCOUNTER — Telehealth: Payer: Self-pay | Admitting: Nurse Practitioner

## 2023-01-03 MED ORDER — SIMVASTATIN 20 MG PO TABS: 20.0000 mg | ORAL_TABLET | Freq: Every day | ORAL | 0 refills | Status: DC

## 2023-01-03 NOTE — Telephone Encounter (Signed)
Pharmacy sent refill request for zocor please send to the [facility] [address], [place] - [address]

## 2023-01-20 ENCOUNTER — Encounter: Payer: Self-pay | Admitting: Gynecologic Oncology

## 2023-01-21 ENCOUNTER — Inpatient Hospital Stay: Payer: Medicare Other | Attending: Gynecologic Oncology | Admitting: Gynecologic Oncology

## 2023-01-21 ENCOUNTER — Encounter: Payer: Self-pay | Admitting: Gynecologic Oncology

## 2023-01-21 VITALS — BP 139/68 | HR 64 | Temp 98.1°F | Resp 16 | Ht 60.63 in | Wt 167.8 lb

## 2023-01-21 DIAGNOSIS — Z90722 Acquired absence of ovaries, bilateral: Secondary | ICD-10-CM | POA: Diagnosis not present

## 2023-01-21 DIAGNOSIS — C541 Malignant neoplasm of endometrium: Secondary | ICD-10-CM

## 2023-01-21 DIAGNOSIS — Z8542 Personal history of malignant neoplasm of other parts of uterus: Secondary | ICD-10-CM | POA: Diagnosis not present

## 2023-01-21 DIAGNOSIS — Z803 Family history of malignant neoplasm of breast: Secondary | ICD-10-CM | POA: Insufficient documentation

## 2023-01-21 DIAGNOSIS — Z923 Personal history of irradiation: Secondary | ICD-10-CM | POA: Diagnosis not present

## 2023-01-21 DIAGNOSIS — Z90710 Acquired absence of both cervix and uterus: Secondary | ICD-10-CM | POA: Insufficient documentation

## 2023-01-21 NOTE — Progress Notes (Signed)
Gynecologic Oncology Return Clinic Visit  01/21/23  Reason for Visit: surveillance visit in the setting of advanced stage uterine cancer   Treatment History: Oncology History Overview Note  IHC - loss of MLH1 and PMS2 MLH1 promoter hypermethylation present   Endometrial adenocarcinoma (HCC)  07/18/2020 Surgery   D&C: grade 1 EMCA    Initial Diagnosis   Endometrial adenocarcinoma (HCC)   08/27/2020 Surgery   Robotic-assisted laparoscopic total hysterectomy with bilateral salpingoophorectomy, SLN biopsy    08/27/2020 Pathology Results   A. SENTINEL LYMPH NODE, RIGHT OBTURATOR, BIOPSY:  - Lymph node, negative for carcinoma (0/1)   B. SENTINEL LYMPH NODE, RIGHT EXTERNAL ILIAC, BIOPSY:  - Lymph node, negative for carcinoma (0/1)   C. 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 UTERUS, CERVIX, BILATERAL TUBES AND OVARIES:  - Endometrioid adenocarcinoma, FIGO grade 2, see comment  - Carcinoma invades for a depth of 0.6 cm with a myometrial thickness is  2.0 cm  - Benign unremarkable cervix  - Benign bilateral fallopian tubes and ovaries  - See oncology table    COMMENT:   C.   Immunostain for pancytokeratin (AE1/AE3) highlights focus of  metastatic carcinoma.   D.  Immunohistochemical stains show that the tumor cells show wild-type  (normal) expression pattern for p53 and are negative for p16, consistent  with above interpretation.  The tumor shows 2 different morphologies  with the majority tumor showing a FIGO grade 1 endometrial  adenocarcinoma and a second component with FIGO grade 2 features.  Dr.  Berneice Heinrich has reviewed part C and D and concurs with the diagnosis.  Dr.  Pricilla Holm was notified on 09/02/2020.      ONCOLOGY TABLE:   UTERUS, CARCINOMA OR CARCINOSARCOMA: Resection   Procedure: Total hysterectomy and bilateral salpingo-oophorectomy  Histologic Type: Endometrioid adenocarcinoma  Histologic  Grade: FIGO grade 2  Myometrial Invasion:       Depth of Myometrial Invasion (mm): 6 mm       Myometrial Thickness (mm): 20 mm       Percentage of Myometrial Invasion: Less than 50%  Uterine Serosa Involvement: Not identified  Cervical stromal Involvement: Not identified  Extent of involvement of other tissue/organs: Not identified  Peritoneal/Ascitic Fluid: Not applicable  Lymphovascular Invasion: Not identified  Regional Lymph Nodes:       Pelvic Lymph Nodes Examined:  3 Sentinel             0 Non-sentinel             3 Total       Pelvic Lymph Nodes with Metastasis: 1             Macrometastasis: (>2.0 mm): 0             Micrometastasis: (>0.2 mm and < 2.0 mm): 1             Isolated Tumor Cells (<0.2 mm): 0             Laterality of Lymph Node with Tumor: Left             Extracapsular Extension: Not identified       Para-aortic Lymph Nodes Examined:              0 Sentinel              0 Non-sentinel              0  Total  Distant Metastasis:       Distant Site(s) Involved: Not applicable  Pathologic Stage Classification (pTNM, AJCC 8th Edition): pT1a, pN34mi  Ancillary Studies: MMR / MSI testing will be ordered  Representative Tumor Block: D5  Comment(s): Pancytokeratin was performed on the lymph nodes.    08/27/2020 Cancer Staging   Staging form: Corpus Uteri - Carcinoma and Carcinosarcoma, AJCC 8th Edition - Clinical stage from 08/27/2020: FIGO Stage IIIC1 (cT1a, cN78mi, cM0) - Signed by Carver Fila, MD on 09/03/2020   10/15/2020 - 12/09/2020 Radiation Therapy   Radiation treatment dates:    IMRT: 10/15/20 - 11/18/20 HDR: 3/31, 4/5, 12/09/20   Site/dose:    IMRT: Pelvis / 45 Gy in 25 Gy HDR: Vaginal Cylinder, 2.5 cm / 18 Gy in 3 fractions   Beams/energy:    IMRT: 6X HDR: Ir-192, 3.0 cm length, 1 channel and 7 dwells     Interval History: Doing well.  Minimal pelvic twinges, nothing bothersome, more frequent, or that requires over-the-counter medication.   Otherwise denies any abdominal or pelvic pain.  Denies any vaginal bleeding or discharge.  Reports baseline bowel bladder function.  Past Medical/Surgical History: Past Medical History:  Diagnosis Date   DM type 2 (diabetes mellitus, type 2) (HCC)    Elevated ALT measurement 04/18/2018   resolved   Endometrial cancer [facility])    History of radiation therapy 10/15/20-11/18/20   IMRT endometrium - Dr. Antony Blackbird   History of radiation therapy 11/27/2020, 12/02/2020, 12/09/2020   vaginal brachytherapy - HDR to endometrium      Dr Antony Blackbird   Hyperlipidemia    Hypertension    Obesity    Osteopenia determined by x-ray    PMB (postmenopausal bleeding)    SVD (spontaneous vaginal delivery)    x 8    Past Surgical History:  Procedure Laterality Date   DILATATION & CURETTAGE/HYSTEROSCOPY WITH MYOSURE N/A 07/18/2020   Procedure: DILATATION & CURETTAGE/HYSTEROSCOPY WITH MYOSURE;  Surgeon: Genia Del, MD;  Location: [facility];  Service: Gynecology;  Laterality: N/A;   DILATION AND CURETTAGE OF UTERUS N/A 01/25/2014   Procedure: DILATATION AND CURETTAGE;  Surgeon: Allie Bossier, MD;  Location: [facility];  Service: Gynecology;  Laterality: N/A;   MULTIPLE TOOTH EXTRACTIONS  yrs ago   ROBOTIC ASSISTED TOTAL HYSTERECTOMY WITH BILATERAL SALPINGO OOPHERECTOMY N/A 08/27/2020   Procedure: XI ROBOTIC ASSISTED TOTAL HYSTERECTOMY WITH BILATERAL SALPINGO OOPHORECTOMY,;  Surgeon: Carver Fila, MD;  Location: [facility];  Service: Gynecology;  Laterality: N/A;   SENTINEL NODE BIOPSY N/A 08/27/2020   Procedure: SENTINEL LYMPH NODE BIOPSY;  Surgeon: Carver Fila, MD;  Location: [facility];  Service: Gynecology;  Laterality: N/A;   TUBAL LIGATION  yrs ago    Family History  Problem Relation Age of Onset   Hypertension Mother    Alcohol abuse Father    Anemia Brother    Alcohol abuse Brother    Breast cancer Cousin        maternal cousin   Colon cancer Neg Hx    Esophageal cancer  Neg Hx    Rectal cancer Neg Hx    Stomach cancer Neg Hx    Ovarian cancer Neg Hx     Social History   Socioeconomic History   Marital status: Divorced    Spouse name: Not on file   Number of children: Not on file   Years of education: Not on file   Highest education level: Not on file  Occupational  History   Not on file  Tobacco Use   Smoking status: Never   Smokeless tobacco: Never  Vaping Use   Vaping Use: Never used  Substance and Sexual Activity   Alcohol use: No   Drug use: No   Sexual activity: Not Currently    Birth control/protection: Post-menopausal  Other Topics Concern   Not on file  Social History Narrative   Not on file   Social Determinants of Health   Financial Resource Strain: Low Risk  (08/14/2021)   Overall Financial Resource Strain (CARDIA)    Difficulty of Paying Living Expenses: Not hard at all  Food Insecurity: No Food Insecurity (08/14/2021)   Hunger Vital Sign    Worried About Running Out of Food in the Last Year: Never true    Ran Out of Food in the Last Year: Never true  Transportation Needs: No Transportation Needs (08/14/2021)   PRAPARE - Administrator, Civil Service (Medical): No    Lack of Transportation (Non-Medical): No  Physical Activity: Insufficiently Active (08/14/2021)   Exercise Vital Sign    Days of Exercise per Week: 5 days    Minutes of Exercise per Session: 20 min  Stress: No Stress Concern Present (08/14/2021)   [employer] of Occupational Health - Occupational Stress Questionnaire    Feeling of Stress : Not at all  Social Connections: Not on file    Current Medications:  Current Outpatient Medications:    acetaminophen (TYLENOL) 500 MG tablet, Take 500-1,000 mg by mouth every 6 (six) hours as needed (pain.)., Disp: , Rfl:    albuterol (VENTOLIN HFA) 108 (90 Base) MCG/ACT inhaler, Inhale 2 puffs into the lungs every 6 (six) hours as needed for wheezing or shortness of breath. OK to use as long as  you need., Disp: 8 g, Rfl: 0   Cholecalciferol (VITAMIN D3) 25 MCG (1000 UT) CAPS, Take 1,000 Units by mouth daily., Disp: , Rfl:    gabapentin (NEURONTIN) 100 MG capsule, Take 1 capsule (100 mg total) by mouth 2 (two) times daily., Disp: 45 capsule, Rfl: 0   ibuprofen (ADVIL) 200 MG tablet, Take 600 mg by mouth every 8 (eight) hours as needed (pain.)., Disp: , Rfl:    lisinopril (ZESTRIL) 10 MG tablet, Take 1 tablet (10 mg total) by mouth daily., Disp: 90 tablet, Rfl: 1   metFORMIN (GLUCOPHAGE) 500 MG tablet, Take 1 tablet by mouth once daily with breakfast, Disp: 90 tablet, Rfl: 0   simvastatin (ZOCOR) 20 MG tablet, Take 1 tablet (20 mg total) by mouth at bedtime., Disp: 90 tablet, Rfl: 0   traZODone (DESYREL) 50 MG tablet, Take 0.5 tablets (25 mg total) by mouth at bedtime as needed for sleep., Disp: 30 tablet, Rfl: 5   benzonatate (TESSALON) 200 MG capsule, Take 1 capsule (200 mg total) by mouth 3 (three) times daily as needed for cough. (Patient not taking: Reported on 01/20/2023), Disp: 30 capsule, Rfl: 0   hydrocortisone valerate cream (WESTCORT) 0.2 %, Apply 1 Application topically 2 (two) times daily. (Patient not taking: Reported on 01/20/2023), Disp: 45 g, Rfl: 0   predniSONE (DELTASONE) 20 MG tablet, Take 2 tablets (40 mg total) by mouth daily with breakfast. (Patient not taking: Reported on 01/20/2023), Disp: 10 tablet, Rfl: 0   promethazine-dextromethorphan (PROMETHAZINE-DM) 6.25-15 MG/5ML syrup, Take 2.5-5 mLs by mouth 4 (four) times daily as needed for cough. (Patient not taking: Reported on 01/20/2023), Disp: 118 mL, Rfl: 0  Review of Systems: Denies appetite changes,  fevers, chills, fatigue, unexplained weight changes. Denies hearing loss, neck lumps or masses, mouth sores, ringing in ears or voice changes. Denies cough or wheezing.  Denies shortness of breath. Denies chest pain or palpitations. Denies leg swelling. Denies abdominal distention, pain, blood in stools, constipation,  diarrhea, nausea, vomiting, or early satiety. Denies pain with intercourse, dysuria, frequency, hematuria or incontinence. Denies hot flashes, pelvic pain, vaginal bleeding or vaginal discharge.   Denies joint pain, back pain or muscle pain/cramps. Denies itching, rash, or wounds. Denies dizziness, headaches, numbness or seizures. Denies swollen lymph nodes or glands, denies easy bruising or bleeding. Denies anxiety, depression, confusion, or decreased concentration.  Physical Exam: There were no vitals taken for this visit. General: Alert, oriented, no acute distress. HEENT: Normocephalic, atraumatic, sclera anicteric. Chest: Clear to auscultation bilaterally.  No wheezes or rhonchi. Cardiovascular: Regular rate and rhythm, no murmurs. Abdomen: soft, nontender.  Normoactive bowel sounds.  No masses or hepatosplenomegaly appreciated.  Well-healed incisions. Extremities: Grossly normal range of motion.  Warm, well perfused.  No edema bilaterally. Skin: No rashes or lesions noted. Lymphatics: No cervical, supraclavicular, or inguinal adenopathy. GU: Normal appearing external genitalia without erythema, excoriation, or lesions.  Speculum exam reveals moderately atrophic vaginal mucosa, radiation changes noted.  Shortened vagina measuring approximately 4-5 cm.  No masses or lesions appreciated.  Bimanual exam reveals cuff intact, no nodularity or masses.  Rectovaginal exam confirms these findings.  Laboratory & Radiologic Studies: None new  Assessment & Plan: Julie Orozco is a 77 y.o. 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 MMR IHC loss of MLH1/PMS, MLH1 promoter hypermethylation present.   Patient is overall doing well and is NED on exam.  Patient has not been using her dilator regularly.  Stressed the importance of continuing to do this given some shortening  and atrophy at the top of her vagina.   We discussed signs and symptoms that would be concerning for disease recurrence, and the patient knows to call if she develops any of these between visits.   Given her high risk disease, we will continue with visits every 3 months. She is scheduled to see Dr. Roselind Messier for follow-up in July  Will work on moving this visit to late August and I will see her back in November.   20 minutes of total time was spent for this patient encounter, including preparation, face-to-face counseling with the patient and coordination of care, and documentation of the encounter.  Eugene Garnet, MD  Division of Gynecologic Oncology  Department of Obstetrics and Gynecology  [facility]

## 2023-01-21 NOTE — Patient Instructions (Signed)
It was good to see you today.  I do not see or feel any evidence of cancer recurrence on your exam.  You will see Dr. Roselind Messier in August and I will see you for follow-up in 6 months.  As always, if you develop any new and concerning symptoms before your next visit, please call to see me sooner.

## 2023-02-10 ENCOUNTER — Ambulatory Visit: Payer: Self-pay | Admitting: Radiation Oncology

## 2023-03-01 ENCOUNTER — Other Ambulatory Visit: Payer: Self-pay | Admitting: Physician Assistant

## 2023-03-04 ENCOUNTER — Other Ambulatory Visit: Payer: Self-pay | Admitting: Nurse Practitioner

## 2023-03-15 ENCOUNTER — Ambulatory Visit: Payer: Medicare Other

## 2023-03-17 ENCOUNTER — Ambulatory Visit: Payer: Medicare Other | Admitting: Radiation Oncology

## 2023-03-29 ENCOUNTER — Ambulatory Visit (INDEPENDENT_AMBULATORY_CARE_PROVIDER_SITE_OTHER): Payer: Medicare Other

## 2023-03-29 VITALS — BP 122/60 | HR 65 | Temp 98.0°F | Ht 60.0 in | Wt 169.2 lb

## 2023-03-29 DIAGNOSIS — Z0000 Encounter for general adult medical examination without abnormal findings: Secondary | ICD-10-CM

## 2023-03-29 NOTE — Patient Instructions (Signed)
Julie Orozco , Thank you for taking time to come for your Medicare Wellness Visit. I appreciate your ongoing commitment to your health goals. Please review the following plan we discussed and let me know if I can assist you in the future.   Referrals/Orders/Follow-Ups/Clinician Recommendations: not sleeping well  This is a list of the screening recommended for you and due dates:  Health Maintenance  Topic Date Due   Zoster (Shingles) Vaccine (1 of 2) Never done   Complete foot exam   06/05/2021   DTaP/Tdap/Td vaccine (2 - Td or Tdap) 08/30/2021   COVID-19 Vaccine (5 - 2023-24 season) 04/30/2022   Yearly kidney health urinalysis for diabetes  07/30/2022   Hemoglobin A1C  08/28/2022   Colon Cancer Screening  09/11/2022   Yearly kidney function blood test for diabetes  02/27/2023   Flu Shot  03/31/2023   Eye exam for diabetics  06/06/2023   Medicare Annual Wellness Visit  03/28/2024   Pneumonia Vaccine  Completed   DEXA scan (bone density measurement)  Completed   Hepatitis C Screening  Completed   HPV Vaccine  Aged Out    Advanced directives: (In Chart) A copy of your advanced directives are scanned into your chart should your provider ever need it.  Next Medicare Annual Wellness Visit scheduled for next year: Yes  Preventive Care 35 Years and Older, Female Preventive care refers to lifestyle choices and visits with your health care provider that can promote health and wellness. What does preventive care include? A yearly physical exam. This is also called an annual well check. Dental exams once or twice a year. Routine eye exams. Ask your health care provider how often you should have your eyes checked. Personal lifestyle choices, including: Daily care of your teeth and gums. Regular physical activity. Eating a healthy diet. Avoiding tobacco and drug use. Limiting alcohol use. Practicing safe sex. Taking low-dose aspirin every day. Taking vitamin and mineral supplements as  recommended by your health care provider. What happens during an annual well check? The services and screenings done by your health care provider during your annual well check will depend on your age, overall health, lifestyle risk factors, and family history of disease. Counseling  Your health care provider may ask you questions about your: Alcohol use. Tobacco use. Drug use. Emotional well-being. Home and relationship well-being. Sexual activity. Eating habits. History of falls. Memory and ability to understand (cognition). Work and work Astronomer. Reproductive health. Screening  You may have the following tests or measurements: Height, weight, and BMI. Blood pressure. Lipid and cholesterol levels. These may be checked every 5 years, or more frequently if you are over 73 years old. Skin check. Lung cancer screening. You may have this screening every year starting at age 82 if you have a 30-pack-year history of smoking and currently smoke or have quit within the past 15 years. Fecal occult blood test (FOBT) of the stool. You may have this test every year starting at age 49. Flexible sigmoidoscopy or colonoscopy. You may have a sigmoidoscopy every 5 years or a colonoscopy every 10 years starting at age 90. Hepatitis C blood test. Hepatitis B blood test. Sexually transmitted disease (STD) testing. Diabetes screening. This is done by checking your blood sugar (glucose) after you have not eaten for a while (fasting). You may have this done every 1-3 years. Bone density scan. This is done to screen for osteoporosis. You may have this done starting at age 62. Mammogram. This may be  done every 1-2 years. Talk to your health care provider about how often you should have regular mammograms. Talk with your health care provider about your test results, treatment options, and if necessary, the need for more tests. Vaccines  Your health care provider may recommend certain vaccines, such  as: Influenza vaccine. This is recommended every year. Tetanus, diphtheria, and acellular pertussis (Tdap, Td) vaccine. You may need a Td booster every 10 years. Zoster vaccine. You may need this after age 41. Pneumococcal 13-valent conjugate (PCV13) vaccine. One dose is recommended after age 62. Pneumococcal polysaccharide (PPSV23) vaccine. One dose is recommended after age 53. Talk to your health care provider about which screenings and vaccines you need and how often you need them. This information is not intended to replace advice given to you by your health care provider. Make sure you discuss any questions you have with your health care provider. Document Released: 09/12/2015 Document Revised: 05/05/2016 Document Reviewed: 06/17/2015 Elsevier Interactive Patient Education  2017 [employer].  Fall Prevention in the Home Falls can cause injuries. They can happen to people of all ages. There are many things you can do to make your home safe and to help prevent falls. What can I do on the outside of my home? Regularly fix the edges of walkways and driveways and fix any cracks. Remove anything that might make you trip as you walk through a door, such as a raised step or threshold. Trim any bushes or trees on the path to your home. Use bright outdoor lighting. Clear any walking paths of anything that might make someone trip, such as rocks or tools. Regularly check to see if handrails are loose or broken. Make sure that both sides of any steps have handrails. Any raised decks and porches should have guardrails on the edges. Have any leaves, snow, or ice cleared regularly. Use sand or salt on walking paths during winter. Clean up any spills in your garage right away. This includes oil or grease spills. What can I do in the bathroom? Use night lights. Install grab bars by the toilet and in the tub and shower. Do not use towel bars as grab bars. Use non-skid mats or decals in the tub or  shower. If you need to sit down in the shower, use a plastic, non-slip stool. Keep the floor dry. Clean up any water that spills on the floor as soon as it happens. Remove soap buildup in the tub or shower regularly. Attach bath mats securely with double-sided non-slip rug tape. Do not have throw rugs and other things on the floor that can make you trip. What can I do in the bedroom? Use night lights. Make sure that you have a light by your bed that is easy to reach. Do not use any sheets or blankets that are too big for your bed. They should not hang down onto the floor. Have a firm chair that has side arms. You can use this for support while you get dressed. Do not have throw rugs and other things on the floor that can make you trip. What can I do in the kitchen? Clean up any spills right away. Avoid walking on wet floors. Keep items that you use a lot in easy-to-reach places. If you need to reach something above you, use a strong step stool that has a grab bar. Keep electrical cords out of the way. Do not use floor polish or wax that makes floors slippery. If you must use wax,  use non-skid floor wax. Do not have throw rugs and other things on the floor that can make you trip. What can I do with my stairs? Do not leave any items on the stairs. Make sure that there are handrails on both sides of the stairs and use them. Fix handrails that are broken or loose. Make sure that handrails are as long as the stairways. Check any carpeting to make sure that it is firmly attached to the stairs. Fix any carpet that is loose or worn. Avoid having throw rugs at the top or bottom of the stairs. If you do have throw rugs, attach them to the floor with carpet tape. Make sure that you have a light switch at the top of the stairs and the bottom of the stairs. If you do not have them, ask someone to add them for you. What else can I do to help prevent falls? Wear shoes that: Do not have high heels. Have  rubber bottoms. Are comfortable and fit you well. Are closed at the toe. Do not wear sandals. If you use a stepladder: Make sure that it is fully opened. Do not climb a closed stepladder. Make sure that both sides of the stepladder are locked into place. Ask someone to hold it for you, if possible. Clearly mark and make sure that you can see: Any grab bars or handrails. First and last steps. Where the edge of each step is. Use tools that help you move around (mobility aids) if they are needed. These include: Canes. Walkers. Scooters. Crutches. Turn on the lights when you go into a dark area. Replace any light bulbs as soon as they burn out. Set up your furniture so you have a clear path. Avoid moving your furniture around. If any of your floors are uneven, fix them. If there are any pets around you, be aware of where they are. Review your medicines with your doctor. Some medicines can make you feel dizzy. This can increase your chance of falling. Ask your doctor what other things that you can do to help prevent falls. This information is not intended to replace advice given to you by your health care provider. Make sure you discuss any questions you have with your health care provider. Document Released: 06/12/2009 Document Revised: 01/22/2016 Document Reviewed: 09/20/2014 Elsevier Interactive Patient Education  2017 [employer].

## 2023-03-29 NOTE — Progress Notes (Signed)
Subjective:   Elianni Tassi is a 77 y.o. female who presents for Medicare Annual (Subsequent) preventive examination.  Visit Complete: In person    Review of Systems     Cardiac Risk Factors include: advanced age (>25men, >57 women);diabetes mellitus;hypertension;obesity (BMI >30kg/m2)     Objective:    Today's Vitals   03/29/23 1200  BP: 122/60  Pulse: 65  Temp: 98 F (36.7 C)  TempSrc: Oral  SpO2: 94%  Weight: 169 lb 3.2 oz (76.7 kg)  Height: 5' (1.524 m)   Body mass index is 33.04 kg/m.     03/29/2023   12:12 PM 08/09/2022   10:18 AM 02/04/2022    9:35 AM 09/08/2021    2:38 PM 08/14/2021    9:19 AM 06/18/2021    4:13 PM 01/12/2021    3:47 PM  Advanced Directives  Does Patient Have a Medical Advance Directive? Yes Yes Yes Yes Yes No No  Type of Advance Directive Out of facility DNR (pink MOST or yellow form) Healthcare Power of [place];Living will Living will Healthcare Power of [employer] Power of [place];Living will    Does patient want to make changes to medical advance directive?  No - Patient declined  No - Patient declined     Copy of Healthcare Power of Attorney in Chart?  No - copy requested  No - copy requested Yes - validated most recent copy scanned in chart (See row information)    Would patient like information on creating a medical advance directive?      No - Patient declined No - Patient declined    Current Medications (verified) Outpatient Encounter Medications as of 03/29/2023  Medication Sig   acetaminophen (TYLENOL) 500 MG tablet Take 500-1,000 mg by mouth every 6 (six) hours as needed (pain.).   Cholecalciferol (VITAMIN D3) 25 MCG (1000 UT) CAPS Take 1,000 Units by mouth daily.   ibuprofen (ADVIL) 200 MG tablet Take 600 mg by mouth every 8 (eight) hours as needed (pain.).   lisinopril (ZESTRIL) 10 MG tablet Take 1 tablet (10 mg total) by mouth daily.   metFORMIN (GLUCOPHAGE) 500 MG tablet Take 1 tablet by mouth once daily  with breakfast   simvastatin (ZOCOR) 20 MG tablet Take 1 tablet (20 mg total) by mouth at bedtime.   traZODone (DESYREL) 50 MG tablet TAKE 1/2 (ONE-HALF) TABLET BY MOUTH AT BEDTIME AS NEEDED FOR SLEEP   albuterol (VENTOLIN HFA) 108 (90 Base) MCG/ACT inhaler Inhale 2 puffs into the lungs every 6 (six) hours as needed for wheezing or shortness of breath. OK to use as long as you need. (Patient not taking: Reported on 03/29/2023)   benzonatate (TESSALON) 200 MG capsule Take 1 capsule (200 mg total) by mouth 3 (three) times daily as needed for cough. (Patient not taking: Reported on 01/20/2023)   gabapentin (NEURONTIN) 100 MG capsule Take 1 capsule (100 mg total) by mouth 2 (two) times daily. (Patient not taking: Reported on 03/29/2023)   hydrocortisone valerate cream (WESTCORT) 0.2 % Apply 1 Application topically 2 (two) times daily. (Patient not taking: Reported on 01/20/2023)   predniSONE (DELTASONE) 20 MG tablet Take 2 tablets (40 mg total) by mouth daily with breakfast. (Patient not taking: Reported on 01/20/2023)   promethazine-dextromethorphan (PROMETHAZINE-DM) 6.25-15 MG/5ML syrup Take 2.5-5 mLs by mouth 4 (four) times daily as needed for cough. (Patient not taking: Reported on 01/20/2023)   No facility-administered encounter medications on file as of 03/29/2023.    Allergies (verified) Patient has no  known allergies.   History: Past Medical History:  Diagnosis Date   DM type 2 (diabetes mellitus, type 2) (HCC)    Elevated ALT measurement 04/18/2018   resolved   Endometrial cancer [facility])    History of radiation therapy 10/15/20-11/18/20   IMRT endometrium - Dr. Antony Blackbird   History of radiation therapy 11/27/2020, 12/02/2020, 12/09/2020   vaginal brachytherapy - HDR to endometrium      Dr Antony Blackbird   Hyperlipidemia    Hypertension    Obesity    Osteopenia determined by x-ray    PMB (postmenopausal bleeding)    SVD (spontaneous vaginal delivery)    x 8   Past Surgical History:   Procedure Laterality Date   DILATATION & CURETTAGE/HYSTEROSCOPY WITH MYOSURE N/A 07/18/2020   Procedure: DILATATION & CURETTAGE/HYSTEROSCOPY WITH MYOSURE;  Surgeon: Genia Del, MD;  Location: [facility];  Service: Gynecology;  Laterality: N/A;   DILATION AND CURETTAGE OF UTERUS N/A 01/25/2014   Procedure: DILATATION AND CURETTAGE;  Surgeon: Allie Bossier, MD;  Location: [facility];  Service: Gynecology;  Laterality: N/A;   MULTIPLE TOOTH EXTRACTIONS  yrs ago   ROBOTIC ASSISTED TOTAL HYSTERECTOMY WITH BILATERAL SALPINGO OOPHERECTOMY N/A 08/27/2020   Procedure: XI ROBOTIC ASSISTED TOTAL HYSTERECTOMY WITH BILATERAL SALPINGO OOPHORECTOMY,;  Surgeon: Carver Fila, MD;  Location: [facility];  Service: Gynecology;  Laterality: N/A;   SENTINEL NODE BIOPSY N/A 08/27/2020   Procedure: SENTINEL LYMPH NODE BIOPSY;  Surgeon: Carver Fila, MD;  Location: [facility];  Service: Gynecology;  Laterality: N/A;   TUBAL LIGATION  yrs ago   Family History  Problem Relation Age of Onset   Hypertension Mother    Alcohol abuse Father    Anemia Brother    Alcohol abuse Brother    Breast cancer Cousin        maternal cousin   Colon cancer Neg Hx    Esophageal cancer Neg Hx    Rectal cancer Neg Hx    Stomach cancer Neg Hx    Ovarian cancer Neg Hx    Social History   Socioeconomic History   Marital status: Divorced    Spouse name: Not on file   Number of children: Not on file   Years of education: Not on file   Highest education level: Not on file  Occupational History   Not on file  Tobacco Use   Smoking status: Never   Smokeless tobacco: Never  Vaping Use   Vaping status: Never Used  Substance and Sexual Activity   Alcohol use: No   Drug use: No   Sexual activity: Not Currently    Birth control/protection: Post-menopausal  Other Topics Concern   Not on file  Social History Narrative   Not on file   Social Determinants of Health   Financial Resource Strain: Low Risk   (03/29/2023)   Overall Financial Resource Strain (CARDIA)    Difficulty of Paying Living Expenses: Not hard at all  Food Insecurity: No Food Insecurity (03/29/2023)   Hunger Vital Sign    Worried About Running Out of Food in the Last Year: Never true    Ran Out of Food in the Last Year: Never true  Transportation Needs: No Transportation Needs (03/29/2023)   PRAPARE - Administrator, Civil Service (Medical): No    Lack of Transportation (Non-Medical): No  Physical Activity: Insufficiently Active (03/29/2023)   Exercise Vital Sign    Days of Exercise per Week: 7 days  Minutes of Exercise per Session: 20 min  Stress: No Stress Concern Present (03/29/2023)   [employer] of Occupational Health - Occupational Stress Questionnaire    Feeling of Stress : Not at all  Social Connections: Moderately Isolated (03/29/2023)   Social Connection and Isolation Panel [NHANES]    Frequency of Communication with Friends and Family: More than three times a week    Frequency of Social Gatherings with Friends and Family: Twice a week    Attends Religious Services: More than 4 times per year    Active Member of [employer] or Organizations: No    Attends Engineer, structural: Never    Marital Status: Divorced    Tobacco Counseling Counseling given: Not Answered   Clinical Intake:  Pre-visit preparation completed: Yes  Pain : No/denies pain     Nutritional Status: BMI > 30  Obese Nutritional Risks: None Diabetes: Yes CBG done?: No Did pt. bring in CBG monitor from home?: No  How often do you need to have someone help you when you read instructions, pamphlets, or other written materials from your doctor or pharmacy?: 3 - Sometimes  Interpreter Needed?: Yes  Comments: daughter interprets Information entered by :: NAllen LPN   Activities of Daily Living    03/29/2023   12:04 PM  In your present state of health, do you have any difficulty performing the following  activities:  Hearing? 0  Vision? 0  Difficulty concentrating or making decisions? 0  Walking or climbing stairs? 0  Dressing or bathing? 0  Doing errands, shopping? 1  Comment daughter Insurance claims handler and eating ? N  Using the Toilet? N  In the past six months, have you accidently leaked urine? N  Do you have problems with loss of bowel control? N  Managing your Medications? N  Managing your Finances? N  Housekeeping or managing your Housekeeping? N    Patient Care Team: Early, Sung Amabile, NP as PCP - General (Nurse Practitioner)  Indicate any recent Medical Services you may have received from other than [facility] providers in the past year (date may be approximate).     Assessment:   This is a routine wellness examination for [place].  Hearing/Vision screen Hearing Screening - Comments:: Denies hearing issues Vision Screening - Comments:: Regular eye exams, [facility]  Dietary issues and exercise activities discussed:     Goals Addressed             This Visit's Progress    Patient Stated       03/29/2023, wants to lose weight       Depression Screen    03/29/2023   12:14 PM 11/06/2021    9:21 AM 08/14/2021    9:21 AM 05/25/2021    9:20 AM 06/05/2020   10:21 AM 05/30/2019   10:29 AM 04/17/2018   10:11 AM  PHQ 2/9 Scores  PHQ - 2 Score 0 0 0 0 0 0 0  PHQ- 9 Score 2          Fall Risk    03/29/2023   12:13 PM 02/26/2022    9:23 AM 11/06/2021    9:20 AM 08/14/2021    9:21 AM 05/25/2021    9:20 AM  Fall Risk   Falls in the past year? 0 0 0 0 0  Number falls in past yr: 0 0 0  0  Injury with Fall? 0 0 0  0  Risk for fall due to : Medication side  effect No Fall Risks No Fall Risks Medication side effect No Fall Risks  Follow up Falls prevention discussed;Falls evaluation completed Falls evaluation completed Falls evaluation completed Falls evaluation completed;Education provided;Falls prevention discussed Falls evaluation completed    MEDICARE RISK AT  HOME:  Medicare Risk at Home - 03/29/23 1213     Any stairs in or around the home? Yes    If so, are there any without handrails? No    Home free of loose throw rugs in walkways, pet beds, electrical cords, etc? Yes    Adequate lighting in your home to reduce risk of falls? Yes    Life alert? No    Use of a cane, walker or w/c? No    Grab bars in the bathroom? No    Shower chair or bench in shower? No    Elevated toilet seat or a handicapped toilet? Yes             TIMED UP AND GO:  Was the test performed?  No    Cognitive Function:      CIT not administered due to language barrier. Seems cognitive via conversation through daughter   Immunizations Immunization History  Administered Date(s) Administered   Fluad Quad(high Dose 65+) 06/05/2020, 05/25/2021   Influenza, High Dose Seasonal PF 06/02/2016, 05/25/2017, 08/08/2018   PFIZER(Purple Top)SARS-COV-2 Vaccination 11/21/2019, 12/12/2019, 08/08/2020   Pfizer Covid-19 Vaccine Bivalent Booster 71yrs & up 11/06/2021   Pneumococcal Conjugate-13 06/14/2016   Pneumococcal Polysaccharide-23 11/02/2017   Tdap 08/31/2011    TDAP status: Due, Education has been provided regarding the importance of this vaccine. Advised may receive this vaccine at local pharmacy or Health Dept. Aware to provide a copy of the vaccination record if obtained from local pharmacy or Health Dept. Verbalized acceptance and understanding.  Flu Vaccine status: Up to date  Pneumococcal vaccine status: Up to date  Covid-19 vaccine status: Completed vaccines  Qualifies for Shingles Vaccine? Yes   Zostavax completed No   Shingrix Completed?: No.    Education has been provided regarding the importance of this vaccine. Patient has been advised to call insurance company to determine out of pocket expense if they have not yet received this vaccine. Advised may also receive vaccine at local pharmacy or Health Dept. Verbalized acceptance and  understanding.  Screening Tests Health Maintenance  Topic Date Due   Zoster Vaccines- Shingrix (1 of 2) Never done   FOOT EXAM  06/05/2021   DTaP/Tdap/Td (2 - Td or Tdap) 08/30/2021   COVID-19 Vaccine (5 - 2023-24 season) 04/30/2022   Diabetic kidney evaluation - Urine ACR  07/30/2022   HEMOGLOBIN A1C  08/28/2022   Colonoscopy  09/11/2022   Diabetic kidney evaluation - eGFR measurement  02/27/2023   INFLUENZA VACCINE  03/31/2023   OPHTHALMOLOGY EXAM  06/06/2023   Medicare Annual Wellness (AWV)  03/28/2024   Pneumonia Vaccine 61+ Years old  Completed   DEXA SCAN  Completed   Hepatitis C Screening  Completed   HPV VACCINES  Aged Out    Health Maintenance  Health Maintenance Due  Topic Date Due   Zoster Vaccines- Shingrix (1 of 2) Never done   FOOT EXAM  06/05/2021   DTaP/Tdap/Td (2 - Td or Tdap) 08/30/2021   COVID-19 Vaccine (5 - 2023-24 season) 04/30/2022   Diabetic kidney evaluation - Urine ACR  07/30/2022   HEMOGLOBIN A1C  08/28/2022   Colonoscopy  09/11/2022   Diabetic kidney evaluation - eGFR measurement  02/27/2023    Colorectal cancer  screening: Type of screening: Colonoscopy. Completed 09/12/2019. Repeat every 3 years  Mammogram status: Completed 09/16/2021. Repeat every year. Daughter to schedule  Bone Density status: Completed 07/30/2019.   Lung Cancer Screening: (Low Dose CT Chest recommended if Age 69-80 years, 20 pack-year currently smoking OR have quit w/in 15years.) does not qualify.   Lung Cancer Screening Referral: no  Additional Screening:  Hepatitis C Screening: does qualify; Completed 04/17/2018  Vision Screening: Recommended annual ophthalmology exams for early detection of glaucoma and other disorders of the eye. Is the patient up to date with their annual eye exam?  Yes  Who is the provider or what is the name of the office in which the patient attends annual eye exams? [facility] If pt is not established with a provider, would they like to be  referred to a provider to establish care? No .   Dental Screening: Recommended annual dental exams for proper oral hygiene  Diabetic Foot Exam: Diabetic Foot Exam: Overdue, Pt has been advised about the importance in completing this exam. Pt is scheduled for diabetic foot exam on next appointment.  Community Resource Referral / Chronic Care Management: CRR required this visit?  No   CCM required this visit?  No     Plan:     I have personally reviewed and noted the following in the patient's chart:   Medical and social history Use of alcohol, tobacco or illicit drugs  Current medications and supplements including opioid prescriptions. Patient is not currently taking opioid prescriptions. Functional ability and status Nutritional status Physical activity Advanced directives List of other physicians Hospitalizations, surgeries, and ER visits in previous 12 months Vitals Screenings to include cognitive, depression, and falls Referrals and appointments  In addition, I have reviewed and discussed with patient certain preventive protocols, quality metrics, and best practice recommendations. A written personalized care plan for preventive services as well as general preventive health recommendations were provided to patient.     Barb Merino, LPN   [id number]   After Visit Summary: In person  Nurse Notes: none

## 2023-04-07 ENCOUNTER — Encounter: Payer: Self-pay | Admitting: Nurse Practitioner

## 2023-04-07 ENCOUNTER — Ambulatory Visit (INDEPENDENT_AMBULATORY_CARE_PROVIDER_SITE_OTHER): Payer: Medicare Other | Admitting: Nurse Practitioner

## 2023-04-07 VITALS — BP 128/82 | HR 60 | Wt 170.4 lb

## 2023-04-07 DIAGNOSIS — E1159 Type 2 diabetes mellitus with other circulatory complications: Secondary | ICD-10-CM

## 2023-04-07 DIAGNOSIS — E785 Hyperlipidemia, unspecified: Secondary | ICD-10-CM | POA: Diagnosis not present

## 2023-04-07 DIAGNOSIS — T63481A Toxic effect of venom of other arthropod, accidental (unintentional), initial encounter: Secondary | ICD-10-CM

## 2023-04-07 DIAGNOSIS — I152 Hypertension secondary to endocrine disorders: Secondary | ICD-10-CM

## 2023-04-07 DIAGNOSIS — Z23 Encounter for immunization: Secondary | ICD-10-CM

## 2023-04-07 DIAGNOSIS — T782XXA Anaphylactic shock, unspecified, initial encounter: Secondary | ICD-10-CM

## 2023-04-07 DIAGNOSIS — E119 Type 2 diabetes mellitus without complications: Secondary | ICD-10-CM

## 2023-04-07 DIAGNOSIS — E669 Obesity, unspecified: Secondary | ICD-10-CM | POA: Diagnosis not present

## 2023-04-07 DIAGNOSIS — G4700 Insomnia, unspecified: Secondary | ICD-10-CM

## 2023-04-07 DIAGNOSIS — T63441A Toxic effect of venom of bees, accidental (unintentional), initial encounter: Secondary | ICD-10-CM | POA: Diagnosis not present

## 2023-04-07 DIAGNOSIS — E559 Vitamin D deficiency, unspecified: Secondary | ICD-10-CM

## 2023-04-07 DIAGNOSIS — E1169 Type 2 diabetes mellitus with other specified complication: Secondary | ICD-10-CM | POA: Diagnosis not present

## 2023-04-07 LAB — COMPREHENSIVE METABOLIC PANEL

## 2023-04-07 LAB — CBC WITH DIFFERENTIAL/PLATELET
Basophils Absolute: 0.1 10*3/uL (ref 0.0–0.2)
Basos: 1 %
EOS (ABSOLUTE): 0.2 10*3/uL (ref 0.0–0.4)
Eos: 3 %
Hematocrit: 37.4 % (ref 34.0–46.6)
Hemoglobin: 12.3 g/dL (ref 11.1–15.9)
Immature Grans (Abs): 0 10*3/uL (ref 0.0–0.1)
Immature Granulocytes: 0 %
Lymphocytes Absolute: 1.5 10*3/uL (ref 0.7–3.1)
Lymphs: 21 %
MCH: 29.6 pg (ref 26.6–33.0)
MCHC: 32.9 g/dL (ref 31.5–35.7)
MCV: 90 fL (ref 79–97)
Monocytes Absolute: 0.6 10*3/uL (ref 0.1–0.9)
Monocytes: 9 %
Neutrophils Absolute: 4.9 10*3/uL (ref 1.4–7.0)
Neutrophils: 66 %
Platelets: 229 10*3/uL (ref 150–450)
RBC: 4.16 x10E6/uL (ref 3.77–5.28)
RDW: 12.9 % (ref 11.7–15.4)
WBC: 7.4 10*3/uL (ref 3.4–10.8)

## 2023-04-07 LAB — HEMOGLOBIN A1C
Est. average glucose Bld gHb Est-mCnc: 143 mg/dL
Hgb A1c MFr Bld: 6.6 % — ABNORMAL HIGH (ref 4.8–5.6)

## 2023-04-07 MED ORDER — EPINEPHRINE 0.3 MG/0.3ML IJ SOAJ: 0.3000 mg | INTRAMUSCULAR | 3 refills | Status: DC | PRN

## 2023-04-07 MED ORDER — TRAZODONE HCL 50 MG PO TABS: 50.0000 mg | ORAL_TABLET | Freq: Every evening | ORAL | 6 refills | Status: DC | PRN

## 2023-04-07 NOTE — Patient Instructions (Signed)
If her symptoms improve after the epipen you can then give 25mg  of benadryl and 40mg  of pepcid and monitor her closely.    Reaccin anafilctica, en adultos Anaphylactic Reaction, Adult [place] reaccin anafilctica es una reaccin alrgica repentina y muy grave. Se debe tratar a la persona de inmediato. Cules son las causas? Estar expuesto a cosas a las que es alrgico (alrgenos). Pueden ser: [address], [place], [address], [place] o [place]. Medicamentos. Las picaduras o mordeduras de insectos. La sangre o partes de la sangre que le han dado como tratamiento (transfusiones). Productos qumicos. Estos incluyen el ltex y los tintes que se usan en alimentos y pruebas mdicas. Qu incrementa el riesgo? Tener alergias. Haber tenido este tipo de reaccin antes. Tener antecedentes familiares de este tipo de reaccin. Tener asma o eczema. Cules son los signos o sntomas? Sensacin de calor en la cara (rubor). El rostro puede tornarse de color rojo. Ronchas. Las ronchas son reas de la piel hinchadas, rojas y que pican. Hinchazn de los ojos, los labios, la cara, la [place], la boca o Administrator. Problemas para respirar o hablar. Dificultad para tragar. Mareos o [employer]. Dolor o clicos en el vientre (abdomen). Vmitos o deposiciones acuosas (diarrea). Cmo se trata? Si est teniendo Runner, broadcasting/film/video grave, usted debe: Aplicarse una inyeccin con un dispositivo que tiene epinefrina Naval architect). El Primary school teacher a Arts administrator. Pedir ayuda. Si [place] el Management consultant, igual debe recibir [employer]. Es posible que le administren lo siguiente: Medicamentos. Oxgeno. Lquidos mediante una va intravenosa. Siga estas instrucciones en su casa: Seguridad Siempre tenga a mano un Management consultant. Si puede, lleve dos [employer]. Use el lpiz como se lo haya indicado el mdico. Despus de usar el lpiz, obtenga ms medicamento para llenarlo de  inmediato. No conduzca despus de [employer] reaccin. Espere hasta que el mdico le diga que es seguro conducir. Asegrese de que usted, las personas que viven con usted y su empleador sepan: A qu es Best boy. Cmo usar Civil engineer, contracting. Si se lo pide el mdico, use un brazalete o un collar que indiquen que tiene [place]. Conozca los signos de una reaccin alrgica muy grave. Junto con sus mdicos, planifique qu hacer si tiene una reaccin muy grave. Instrucciones generales [employer] de venta libre y los recetados solamente como se lo haya indicado el mdico. Si tiene picazn en la piel o una erupcin cutnea: Use un medicamento para la alergia segn las indicaciones de su mdico. Aplique paos fros y hmedos sobre la piel. Tome una ducha o bao fro. No use agua caliente. Informe a todos sus mdicos que tiene Systems analyst. Cmo se previene? Evite las cosas que le hayan dado una reaccin grave antes. Informe al camarero sobre su alergia cuando salga a comer. Si no est seguro de si su comida contiene alimentos a los que usted es alrgico, pregntele al camarero antes de comer. Dnde obtener ms informacin American Academy of Allergy, Asthma and Immunology Data processing manager) (Academia Estadounidense de [place], [place] e Inmunologa): aaaai.org Comunquese con un mdico si: El Management consultant que tiene est vencido. Solicite ayuda de inmediato si: Tiene una reaccin alrgica grave. [place] su lpiz autoinyector. Debe ir al hospital incluso si el medicamento parece estar surtiendo [place]. Estos sntomas pueden Customer service manager. Use el lpiz autoinyector de inmediato. Luego llame al 911. No espere a ver si los sntomas desaparecen. No conduzca por sus propios medios [employer] hospital. Esta informacin no  tiene Theme park manager el consejo del mdico. Asegrese de hacerle al mdico cualquier pregunta que tenga. Document Revised: 06/21/2022 Document Reviewed:  06/21/2022 Elsevier Patient Education  2024 [employer].

## 2023-04-07 NOTE — Progress Notes (Signed)
[name], DNP, AGNP-c [facility]  [address] [place], [place] [phone]  ESTABLISHED PATIENT- Chronic Health and/or Follow-Up Visit  Blood pressure 128/82, pulse 60, weight 170 lb 6.4 oz (77.3 kg).    Julie Orozco is a 77 y.o. year old female presenting today for evaluation and management of chronic conditions.   Insomnia She reports concerns with difficulty staying asleep at night.  She is able to fall asleep without many problems but she is not able to stay asleep for any significant length of time.  When she is awake she is unable to fall back asleep.  She has tried over-the-counter medications but these have not been helpful.  She has not tried prescription medications in the past.  Diabetes/HTN/HLD She reports her diabetes is fairly well-controlled.  She has been out of lancets and test strips so she is not sure what her sugar has been running most recently.  She denies symptoms of increased hunger, thirst, urination.  She has not had any slow healing wounds or infections. She is not checking her BP regularly at home but is not experiencing any headaches, vision changes, or dizziness. She is eating a well balanced diet and working to keep her carbohydrate intake low.   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 She has not had any instances where she has been completely unable to breath, but each encounter her symptoms are worse. She has had 5 instances of stings with this type of reaction. She does not have an epi pen.   All ROS negative with exception of what is listed above.   PHYSICAL EXAM Physical Exam Vitals and nursing note reviewed.  Constitutional:      Appearance: Normal appearance.  HENT:     Head: Normocephalic.  Eyes:     Conjunctiva/sclera: Conjunctivae normal.     Pupils: Pupils are equal,  round, and reactive to light.  Cardiovascular:     Rate and Rhythm: Normal rate and regular rhythm.     Pulses: Normal pulses.     Heart sounds: Normal heart sounds.  Pulmonary:     Effort: Pulmonary effort is normal.     Breath sounds: Normal breath sounds.  Abdominal:     General: Bowel sounds are normal.     Palpations: Abdomen is soft.  Musculoskeletal:        General: Normal range of motion.     Cervical back: Normal range of motion.  Skin:    General: Skin is warm and dry.     Capillary Refill: Capillary refill takes less than 2 seconds.  Neurological:     General: No focal deficit present.     Mental Status: She is alert and oriented to person, place, and time.  Psychiatric:        Mood and Affect: Mood normal.     PLAN Problem List Items Addressed This Visit     Obesity (BMI 30-39.9)    Chronic. Recommend increased physical activity, small meals with high protein and low carbs, increased water intake, and increased protein to help with management. In the setting of hypertension, diabetes, and hyperlipidemia her risks of cardiovascular disease are elevated which does create a greater urgency for control. Will continue to monitor.       Hypertension associated with diabetes (HCC)    Chronic.  Associated with diabetes, hyperlipidemia, and obesity.  Currently managed  with lisinopril. Her blood pressures are looking good today at 128/82. No concerning symptoms are present. Exam benign. Will plan to continue current treatment.  Labs pending today.      Relevant Medications   EPINEPHrine (EPIPEN 2-PAK) 0.3 mg/0.3 mL IJ SOAJ injection   Vitamin D deficiency    Currently on vitamin D therapy.  Will continue to monitor.      Type 2 diabetes mellitus with other specified complication (HCC) - Primary    Chronic diabetes in the setting of hypertension, hyperlipidemia, and obesity.  She is currently managed with metformin.  Historically her A1c has been in the 6%-7% range.  At  this time she is not having any concerning symptoms.  She is tolerating her medication well.  She is in need of refills on Accu-Chek guide me lancets and test strips.  Will monitor labs today and make changes to the plan of care as necessary based on findings.      Relevant Medications   Lancets Ultra Fine MISC   Glucose Blood (BLOOD GLUCOSE TEST STRIPS 333) STRP   Hyperlipidemia associated with type 2 diabetes mellitus (HCC)    Chronic in the setting of obesity, hypertension, diabetes.  Currently managed with simvastatin.  Diet and exercise recommendations include low carbohydrate and low saturated fat diet with daily exercise.  Recommend increased water intake.  Will monitor labs today.      Relevant Medications   EPINEPHrine (EPIPEN 2-PAK) 0.3 mg/0.3 mL IJ SOAJ injection   Insomnia    Reports difficulty staying asleep.  Falls asleep without difficulty however wakes up [name] and unable to fall back asleep at night.  Has tried over-the-counter medications without success.  We discussed trial of trazodone 30 minutes before bedtime.  Will monitor for effectiveness of this treatment and make changes to the dosage as needed.      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  She has had 5 instances of significant reaction with swelling of the face, mouth, and throat.  We discussed the importance of EpiPen today and proper usage.  We also discussed the importance of emergency evaluation immediately for any swelling around the neck or face as this can escalate very quickly and lead to impaired ability to breathe.  She expresses understanding today.  Will monitor closely.  EpiPen sent to  pharmacy.      Relevant Medications   EPINEPHrine (EPIPEN 2-PAK) 0.3 mg/0.3 mL IJ SOAJ injection   Other Visit Diagnoses     Need for influenza vaccination       Relevant Orders   Flu Vaccine QUAD High Dose(Fluad) (Completed)       Return for AWV. Time: 42 minutes, >50% spent counseling, care coordination, chart review, and documentation.   [name], DNP, AGNP-c

## 2023-04-08 ENCOUNTER — Other Ambulatory Visit: Payer: Self-pay | Admitting: Nurse Practitioner

## 2023-04-15 ENCOUNTER — Other Ambulatory Visit: Payer: Self-pay | Admitting: Nurse Practitioner

## 2023-04-18 ENCOUNTER — Other Ambulatory Visit: Payer: Self-pay | Admitting: Nurse Practitioner

## 2023-04-23 ENCOUNTER — Encounter: Payer: Self-pay | Admitting: Nurse Practitioner

## 2023-04-23 DIAGNOSIS — T63481A Toxic effect of venom of other arthropod, accidental (unintentional), initial encounter: Secondary | ICD-10-CM

## 2023-04-23 DIAGNOSIS — G4700 Insomnia, unspecified: Secondary | ICD-10-CM | POA: Insufficient documentation

## 2023-04-23 HISTORY — DX: Toxic effect of venom of other arthropod, accidental (unintentional), initial encounter: T63.481A

## 2023-04-23 MED ORDER — BLOOD GLUCOSE TEST STRIPS 333 VI STRP: ORAL_STRIP | 99 refills | Status: DC

## 2023-04-23 MED ORDER — LANCETS ULTRA FINE MISC: 99 refills | Status: AC

## 2023-04-23 NOTE — Assessment & Plan Note (Signed)
Chronic. Recommend increased physical activity, small meals with high protein and low carbs, increased water intake, and increased protein to help with management. In the setting of hypertension, diabetes, and hyperlipidemia her risks of cardiovascular disease are elevated which does create a greater urgency for control. Will continue to monitor.

## 2023-04-23 NOTE — Assessment & Plan Note (Signed)
Chronic diabetes in the setting of hypertension, hyperlipidemia, and obesity.  She is currently managed with metformin.  Historically her A1c has been in the 6%-7% range.  At this time she is not having any concerning symptoms.  She is tolerating her medication well.  She is in need of refills on Accu-Chek guide me lancets and test strips.  Will monitor labs today and make changes to the plan of care as necessary based on findings.

## 2023-04-23 NOTE — Assessment & Plan Note (Signed)
Currently on vitamin D therapy.  Will continue to monitor.

## 2023-04-23 NOTE — Assessment & Plan Note (Signed)
Chronic in the setting of obesity, hypertension, diabetes.  Currently managed with simvastatin.  Diet and exercise recommendations include low carbohydrate and low saturated fat diet with daily exercise.  Recommend increased water intake.  Will monitor labs today.

## 2023-04-23 NOTE — Assessment & Plan Note (Signed)
Chronic.  Associated with diabetes, hyperlipidemia, and obesity.  Currently managed with lisinopril. Her blood pressures are looking good today at 128/82. No concerning symptoms are present. Exam benign. Will plan to continue current treatment.  Labs pending today.

## 2023-04-23 NOTE — Assessment & Plan Note (Addendum)
Reports difficulty staying asleep.  Falls asleep without difficulty however wakes up Julie Orozco and unable to fall back asleep at night.  Has tried over-the-counter medications without success.  We discussed trial of trazodone 30 minutes before bedtime.  Will monitor for effectiveness of this treatment and make changes to the dosage as needed.

## 2023-04-23 NOTE — Assessment & Plan Note (Signed)
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  She has had 5 instances of significant reaction with swelling of the face, mouth, and throat.  We discussed the importance of EpiPen today and proper usage.  We also discussed the importance of emergency evaluation immediately for any swelling around the neck or face as this can escalate very quickly and lead to impaired ability to breathe.  She expresses understanding today.  Will monitor closely.  EpiPen sent to pharmacy.

## 2023-04-24 NOTE — Progress Notes (Signed)
Radiation Oncology         [phone] ________________________________  Name: Julie Orozco MRN: [id number]  Date: 04/25/2023  DOB: [birth year]  Follow-Up Visit Note  CC: Early, Sung Amabile, NP  Burnard Hawthorne B, PA-C  No diagnosis found.  Diagnosis: Stage III endometrioid adenocarcinoma (pT1a, pN77mi, M0)   Interval Since Last Radiation: 2 years, 4 months, and 14 days   Radiation treatment dates:    IMRT: 10/15/20 - 11/18/20 HDR: 3/31, 4/5, 12/09/20   Site/dose:    IMRT: Pelvis / 45 Gy in 25 Gy HDR: Vaginal Cylinder, 2.5 cm / 18 Gy in 3 fractions   Beams/energy:    IMRT: 6X HDR: Ir-192, 3.0 cm length, 1 channel and 7 dwells  Narrative:  The patient returns today for routine 6 month follow-up. She was last seen here for follow-up on 08/09/22. Since her last visit, the patient followed up with Dr. Pricilla Holm on 01/21/23. During which time, the patient reported having very mild pelvic twinges. She otherwise denied any symptoms concerning for disease recurrence and she was noted as NED on examination. GU exam performed at that time did note some shortening and atrophy at the top of her vagina, and she was reminded to use her vaginal dilator regularly to help prevent these changes.            No other significant oncologic interval history since the patient was last seen for follow-up.   ***                        Allergies:  has No Known Allergies.  Meds: Current Outpatient Medications  Medication Sig Dispense Refill   acetaminophen (TYLENOL) 500 MG tablet Take 500-1,000 mg by mouth every 6 (six) hours as needed (pain.).     Cholecalciferol (VITAMIN D3) 25 MCG (1000 UT) CAPS Take 1,000 Units by mouth daily.     EPINEPHrine (EPIPEN 2-PAK) 0.3 mg/0.3 mL IJ SOAJ injection Inject 0.3 mg into the muscle as needed for anaphylaxis. Call 911 immediately. 2 each 3   Glucose Blood (BLOOD GLUCOSE TEST STRIPS 333) STRP For up to 4 tests per day. Patients device: Acucheck Guide Me. Please  distribute per insurance preference and patient device. 200 strip 99   ibuprofen (ADVIL) 200 MG tablet Take 600 mg by mouth every 8 (eight) hours as needed (pain.).     Lancets Ultra Fine MISC Lancets for use with Acucheck guide me meter. For blood sugar monitoring up to four times a day. 200 each 99   lisinopril (ZESTRIL) 10 MG tablet Take 1 tablet by mouth once daily 90 tablet 1   metFORMIN (GLUCOPHAGE) 500 MG tablet Take 1 tablet by mouth once daily with breakfast 90 tablet 0   simvastatin (ZOCOR) 20 MG tablet TAKE 1 TABLET BY MOUTH AT BEDTIME 90 tablet 1   traZODone (DESYREL) 50 MG tablet Take 1-2 tablets (50-100 mg total) by mouth at bedtime as needed for sleep. 60 tablet 6   No current facility-administered medications for this encounter.    Physical Findings: The patient is in no acute distress. Patient is alert and oriented.  vitals were not taken for this visit. .  No significant changes. Lungs are clear to auscultation bilaterally. Heart has regular rate and rhythm. No palpable cervical, supraclavicular, or axillary adenopathy. Abdomen soft, non-tender, normal bowel sounds.  On pelvic examination the external genitalia were unremarkable. A speculum exam was performed. There are no mucosal lesions noted in the vaginal  vault. A Pap smear was obtained of the proximal vagina. On bimanual and rectovaginal examination there were no pelvic masses appreciated. ***   Lab Findings: Lab Results  Component Value Date   WBC 7.4 04/07/2023   HGB 12.3 04/07/2023   HCT 37.4 04/07/2023   MCV 90 04/07/2023   PLT 229 04/07/2023    Radiographic Findings: No results found.  Impression: Stage III endometrioid adenocarcinoma (pT1a, pN74mi, M0)   The patient is recovering from the effects of radiation.  ***  Plan:  ***   *** minutes of total time was spent for this patient encounter, including preparation, face-to-face counseling with the patient and coordination of care, physical exam, and  documentation of the encounter. ____________________________________  Billie Lade, PhD, MD  This document serves as a record of services personally performed by Antony Blackbird, MD. It was created on his behalf by Neena Rhymes, a trained medical scribe. The creation of this record is based on the scribe's personal observations and the provider's statements to them. This document has been checked and approved by the attending provider.

## 2023-04-25 ENCOUNTER — Ambulatory Visit
Admission: RE | Admit: 2023-04-25 | Discharge: 2023-04-25 | Disposition: A | Discharge status: Home / Self Care | Payer: Medicare Other | Source: Ambulatory Visit | Attending: Radiation Oncology | Admitting: Radiation Oncology

## 2023-04-25 VITALS — BP 124/59 | HR 72 | Temp 97.4°F | Resp 20 | Ht 60.0 in | Wt 168.6 lb

## 2023-04-25 DIAGNOSIS — Z79899 Other long term (current) drug therapy: Secondary | ICD-10-CM | POA: Insufficient documentation

## 2023-04-25 DIAGNOSIS — Z7984 Long term (current) use of oral hypoglycemic drugs: Secondary | ICD-10-CM | POA: Diagnosis not present

## 2023-04-25 DIAGNOSIS — C541 Malignant neoplasm of endometrium: Secondary | ICD-10-CM | POA: Diagnosis not present

## 2023-04-25 DIAGNOSIS — Z923 Personal history of irradiation: Secondary | ICD-10-CM | POA: Diagnosis not present

## 2023-04-25 DIAGNOSIS — Z8542 Personal history of malignant neoplasm of other parts of uterus: Secondary | ICD-10-CM | POA: Insufficient documentation

## 2023-04-25 NOTE — Progress Notes (Signed)
Julie Orozco is here today for follow up post radiation to the pelvic.  They completed their radiation on: 12/09/2020  Does the patient complain of any of the following:  Pain:No Abdominal bloating: Yes, intermittently Diarrhea/Constipation: No Nausea/Vomiting: No Vaginal Discharge: No Blood in Urine or Stool: No Urinary Issues (dysuria/incomplete emptying/ incontinence/ increased frequency/urgency): Some leakage Does patient report using vaginal dilator 2-3 times a week and/or sexually active 2-3 weeks: N, but reinforced the importance of use. Post radiation skin changes:    BP (!) 124/59 (BP Location: Left Arm, Patient Position: Sitting, Cuff Size: Normal)   Pulse 72   Temp (!) 97.4 F (36.3 C)   Resp 20   Ht 5' (1.524 m)   Wt 168 lb 9.6 oz (76.5 kg)   SpO2 98%   BMI 32.93 kg/m

## 2023-06-01 ENCOUNTER — Other Ambulatory Visit: Payer: Self-pay | Admitting: Nurse Practitioner

## 2023-07-08 ENCOUNTER — Inpatient Hospital Stay: Payer: Medicare Other | Attending: Gynecologic Oncology | Admitting: Gynecologic Oncology

## 2023-07-08 ENCOUNTER — Encounter: Payer: Self-pay | Admitting: Gynecologic Oncology

## 2023-07-08 VITALS — BP 128/70 | HR 74 | Temp 98.3°F | Resp 19 | Wt 168.8 lb

## 2023-07-08 DIAGNOSIS — Z90710 Acquired absence of both cervix and uterus: Secondary | ICD-10-CM | POA: Diagnosis not present

## 2023-07-08 DIAGNOSIS — Z08 Encounter for follow-up examination after completed treatment for malignant neoplasm: Secondary | ICD-10-CM | POA: Diagnosis not present

## 2023-07-08 DIAGNOSIS — C541 Malignant neoplasm of endometrium: Secondary | ICD-10-CM

## 2023-07-08 DIAGNOSIS — Z923 Personal history of irradiation: Secondary | ICD-10-CM | POA: Diagnosis not present

## 2023-07-08 DIAGNOSIS — Z90722 Acquired absence of ovaries, bilateral: Secondary | ICD-10-CM | POA: Diagnosis not present

## 2023-07-08 DIAGNOSIS — Z8542 Personal history of malignant neoplasm of other parts of uterus: Secondary | ICD-10-CM | POA: Insufficient documentation

## 2023-07-08 NOTE — Patient Instructions (Signed)
It was good to see you today.  I do not see or feel any evidence of cancer recurrence on your exam.  I will see you for follow-up in August.  As always, if you develop any new and concerning symptoms before your next visit, please call to see me sooner.

## 2023-07-08 NOTE — Progress Notes (Signed)
Gynecologic Oncology Return Clinic Visit  07/08/23  Reason for Visit: surveillance visit in the setting of advanced stage uterine cancer   Treatment History: Oncology History Overview Note  IHC - loss of MLH1 and PMS2 MLH1 promoter hypermethylation present   Endometrial adenocarcinoma (HCC)  07/18/2020 Surgery   D&C: grade 1 EMCA    Initial Diagnosis   Endometrial adenocarcinoma (HCC)   08/27/2020 Surgery   Robotic-assisted laparoscopic total hysterectomy with bilateral salpingoophorectomy, SLN biopsy    08/27/2020 Pathology Results   A. SENTINEL LYMPH NODE, RIGHT OBTURATOR, BIOPSY:  - Lymph node, negative for carcinoma (0/1)   B. SENTINEL LYMPH NODE, RIGHT EXTERNAL ILIAC, BIOPSY:  - Lymph node, negative for carcinoma (0/1)   C. 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 UTERUS, CERVIX, BILATERAL TUBES AND OVARIES:  - Endometrioid adenocarcinoma, FIGO grade 2, see comment  - Carcinoma invades for a depth of 0.6 cm with a myometrial thickness is  2.0 cm  - Benign unremarkable cervix  - Benign bilateral fallopian tubes and ovaries  - See oncology table    COMMENT:   C.   Immunostain for pancytokeratin (AE1/AE3) highlights focus of  metastatic carcinoma.   D.  Immunohistochemical stains show that the tumor cells show wild-type  (normal) expression pattern for p53 and are negative for p16, consistent  with above interpretation.  The tumor shows 2 different morphologies  with the majority tumor showing a FIGO grade 1 endometrial  adenocarcinoma and a second component with FIGO grade 2 features.  Dr.  Berneice Heinrich has reviewed part C and D and concurs with the diagnosis.  Dr.  Pricilla Holm was notified on 09/02/2020.      ONCOLOGY TABLE:   UTERUS, CARCINOMA OR CARCINOSARCOMA: Resection   Procedure: Total hysterectomy and bilateral salpingo-oophorectomy  Histologic Type: Endometrioid adenocarcinoma  Histologic  Grade: FIGO grade 2  Myometrial Invasion:       Depth of Myometrial Invasion (mm): 6 mm       Myometrial Thickness (mm): 20 mm       Percentage of Myometrial Invasion: Less than 50%  Uterine Serosa Involvement: Not identified  Cervical stromal Involvement: Not identified  Extent of involvement of other tissue/organs: Not identified  Peritoneal/Ascitic Fluid: Not applicable  Lymphovascular Invasion: Not identified  Regional Lymph Nodes:       Pelvic Lymph Nodes Examined:  3 Sentinel             0 Non-sentinel             3 Total       Pelvic Lymph Nodes with Metastasis: 1             Macrometastasis: (>2.0 mm): 0             Micrometastasis: (>0.2 mm and < 2.0 mm): 1             Isolated Tumor Cells (<0.2 mm): 0             Laterality of Lymph Node with Tumor: Left             Extracapsular Extension: Not identified       Para-aortic Lymph Nodes Examined:              0 Sentinel              0 Non-sentinel              0  Total  Distant Metastasis:       Distant Site(s) Involved: Not applicable  Pathologic Stage Classification (pTNM, AJCC 8th Edition): pT1a, pN79mi  Ancillary Studies: MMR / MSI testing will be ordered  Representative Tumor Block: D5  Comment(s): Pancytokeratin was performed on the lymph nodes.    08/27/2020 Cancer Staging   Staging form: Corpus Uteri - Carcinoma and Carcinosarcoma, AJCC 8th Edition - Clinical stage from 08/27/2020: FIGO Stage IIIC1 (cT1a, cN81mi, cM0) - Signed by Carver Fila, MD on 09/03/2020   10/15/2020 - 12/09/2020 Radiation Therapy   Radiation treatment dates:    IMRT: 10/15/20 - 11/18/20 HDR: 3/31, 4/5, 12/09/20   Site/dose:    IMRT: Pelvis / 45 Gy in 25 Gy HDR: Vaginal Cylinder, 2.5 cm / 18 Gy in 3 fractions   Beams/energy:    IMRT: 6X HDR: Ir-192, 3.0 cm length, 1 channel and 7 dwells     Interval History: Doing well.  Denies any vaginal bleeding or discharge.  Reports baseline bowel and bladder function.  Denies any abdominal  or pelvic pain.  Past Medical/Surgical History: Past Medical History:  Diagnosis Date   Asymptomatic microscopic hematuria 06/05/2020   DM type 2 (diabetes mellitus, type 2) (HCC)    Dysuria 08/09/2022   Elevated ALT measurement 04/18/2018   resolved   Elevated liver enzymes 06/27/2019   Endometrial cancer (HCC)    History of dilatation and curettage 04/29/2020   History of radiation therapy 10/15/20-11/18/20   IMRT endometrium - Dr. Antony Blackbird   History of radiation therapy 11/27/2020, 12/02/2020, 12/09/2020   vaginal brachytherapy - HDR to endometrium      Dr Antony Blackbird   Hyperlipidemia    Hypertension    Intermittent palpitations 06/05/2020   Obesity    Osteopenia determined by x-ray    PMB (postmenopausal bleeding)    PMB (postmenopausal bleeding) 07/18/2013   SVD (spontaneous vaginal delivery)    x 8   Upper respiratory tract infection 10/05/2022   Vulvar candidiasis 09/16/2020    Past Surgical History:  Procedure Laterality Date   DILATATION & CURETTAGE/HYSTEROSCOPY WITH MYOSURE N/A 07/18/2020   Procedure: DILATATION & CURETTAGE/HYSTEROSCOPY WITH MYOSURE;  Surgeon: Genia Del, MD;  Location: [facility];  Service: Gynecology;  Laterality: N/A;   DILATION AND CURETTAGE OF UTERUS N/A 01/25/2014   Procedure: DILATATION AND CURETTAGE;  Surgeon: Allie Bossier, MD;  Location: [facility];  Service: Gynecology;  Laterality: N/A;   MULTIPLE TOOTH EXTRACTIONS  yrs ago   ROBOTIC ASSISTED TOTAL HYSTERECTOMY WITH BILATERAL SALPINGO OOPHERECTOMY N/A 08/27/2020   Procedure: XI ROBOTIC ASSISTED TOTAL HYSTERECTOMY WITH BILATERAL SALPINGO OOPHORECTOMY,;  Surgeon: Carver Fila, MD;  Location: [facility];  Service: Gynecology;  Laterality: N/A;   SENTINEL NODE BIOPSY N/A 08/27/2020   Procedure: SENTINEL LYMPH NODE BIOPSY;  Surgeon: Carver Fila, MD;  Location: [facility];  Service: Gynecology;  Laterality: N/A;   TUBAL LIGATION  yrs ago    Family History  Problem  Relation Age of Onset   Hypertension Mother    Alcohol abuse Father    Anemia Brother    Alcohol abuse Brother    Breast cancer Cousin        maternal cousin   Colon cancer Neg Hx    Esophageal cancer Neg Hx    Rectal cancer Neg Hx    Stomach cancer Neg Hx    Ovarian cancer Neg Hx     Social History   Socioeconomic History   Marital status: Divorced  Spouse name: Not on file   Number of children: Not on file   Years of education: Not on file   Highest education level: Not on file  Occupational History   Not on file  Tobacco Use   Smoking status: Never   Smokeless tobacco: Never  Vaping Use   Vaping status: Never Used  Substance and Sexual Activity   Alcohol use: No   Drug use: No   Sexual activity: Not Currently    Birth control/protection: Post-menopausal  Other Topics Concern   Not on file  Social History Narrative   Not on file   Social Determinants of Health   Financial Resource Strain: Low Risk  (03/29/2023)   Overall Financial Resource Strain (CARDIA)    Difficulty of Paying Living Expenses: Not hard at all  Food Insecurity: No Food Insecurity (03/29/2023)   Hunger Vital Sign    Worried About Running Out of Food in the Last Year: Never true    Ran Out of Food in the Last Year: Never true  Transportation Needs: No Transportation Needs (03/29/2023)   PRAPARE - Administrator, Civil Service (Medical): No    Lack of Transportation (Non-Medical): No  Physical Activity: Insufficiently Active (03/29/2023)   Exercise Vital Sign    Days of Exercise per Week: 7 days    Minutes of Exercise per Session: 20 min  Stress: No Stress Concern Present (03/29/2023)   [employer] of Occupational Health - Occupational Stress Questionnaire    Feeling of Stress : Not at all  Social Connections: Moderately Isolated (03/29/2023)   Social Connection and Isolation Panel [NHANES]    Frequency of Communication with Friends and Family: More than three times a week     Frequency of Social Gatherings with Friends and Family: Twice a week    Attends Religious Services: More than 4 times per year    Active Member of [employer] or Organizations: No    Attends Banker Meetings: Never    Marital Status: Divorced    Current Medications:  Current Outpatient Medications:    acetaminophen (TYLENOL) 500 MG tablet, Take 500-1,000 mg by mouth every 6 (six) hours as needed (pain.)., Disp: , Rfl:    Cholecalciferol (VITAMIN D3) 25 MCG (1000 UT) CAPS, Take 1,000 Units by mouth daily., Disp: , Rfl:    EPINEPHrine (EPIPEN 2-PAK) 0.3 mg/0.3 mL IJ SOAJ injection, Inject 0.3 mg into the muscle as needed for anaphylaxis. Call 911 immediately., Disp: 2 each, Rfl: 3   Glucose Blood (BLOOD GLUCOSE TEST STRIPS 333) STRP, For up to 4 tests per day. Patients device: Acucheck Guide Me. Please distribute per insurance preference and patient device., Disp: 200 strip, Rfl: 99   ibuprofen (ADVIL) 200 MG tablet, Take 600 mg by mouth every 8 (eight) hours as needed (pain.)., Disp: , Rfl:    Lancets Ultra Fine MISC, Lancets for use with Acucheck guide me meter. For blood sugar monitoring up to four times a day., Disp: 200 each, Rfl: 99   lisinopril (ZESTRIL) 10 MG tablet, Take 1 tablet by mouth once daily, Disp: 90 tablet, Rfl: 1   metFORMIN (GLUCOPHAGE) 500 MG tablet, Take 1 tablet by mouth once daily with breakfast, Disp: 90 tablet, Rfl: 1   simvastatin (ZOCOR) 20 MG tablet, TAKE 1 TABLET BY MOUTH AT BEDTIME, Disp: 90 tablet, Rfl: 1   traZODone (DESYREL) 50 MG tablet, Take 1-2 tablets (50-100 mg total) by mouth at bedtime as needed for sleep., Disp: 60 tablet, Rfl:  6  Review of Systems: Denies appetite changes, fevers, chills, fatigue, unexplained weight changes. Denies hearing loss, neck lumps or masses, mouth sores, ringing in ears or voice changes. Denies cough or wheezing.  Denies shortness of breath. Denies chest pain or palpitations. Denies leg swelling. Denies abdominal  distention, pain, blood in stools, constipation, diarrhea, nausea, vomiting, or early satiety. Denies pain with intercourse, dysuria, frequency, hematuria or incontinence. Denies hot flashes, pelvic pain, vaginal bleeding or vaginal discharge.   Denies joint pain, back pain or muscle pain/cramps. Denies itching, rash, or wounds. Denies dizziness, headaches, numbness or seizures. Denies swollen lymph nodes or glands, denies easy bruising or bleeding. Denies anxiety, depression, confusion, or decreased concentration.  Physical Exam: BP 128/70 (BP Location: Left Arm, Patient Position: Sitting)   Pulse 74   Temp 98.3 F (36.8 C) (Oral)   Resp 19   Wt 168 lb 12.8 oz (76.6 kg)   SpO2 98%   BMI 32.97 kg/m  General: Alert, oriented, no acute distress. HEENT: Normocephalic, atraumatic, sclera anicteric. Chest: Clear to auscultation bilaterally.  No wheezes or rhonchi. Cardiovascular: Regular rate and rhythm, no murmurs. Abdomen: soft, nontender.  Normoactive bowel sounds.  No masses or hepatosplenomegaly appreciated.  Well-healed incisions. Extremities: Grossly normal range of motion.  Warm, well perfused.  No edema bilaterally. Skin: No rashes or lesions noted. Lymphatics: No cervical, supraclavicular, or inguinal adenopathy. GU: Normal appearing external genitalia without erythema, excoriation, or lesions.  Speculum exam reveals moderately atrophic vaginal mucosa, radiation changes noted.  Shortened vagina measuring approximately 4 cm.  No masses or lesions appreciated.  Bimanual exam reveals cuff intact, no nodularity or masses.  Rectovaginal exam confirms these findings.  Laboratory & Radiologic Studies: None new  Assessment & Plan: Julie Orozco is a 77 y.o. 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 MMR IHC loss of MLH1/PMS, MLH1 promoter hypermethylation  present.   Patient is overall doing well and is NED on exam.   We discussed signs and symptoms that would be concerning for disease recurrence, and the patient knows to call if she develops any of these between visits.   Given her high risk disease, we will continue with visits every 3 months with plan to transition to visits every 6 months at 3 years. She is scheduled to see Dr. Roselind Messier for follow-up in February.  I will see her back for follow-up in 03/2024.   20 minutes of total time was spent for this patient encounter, including preparation, face-to-face counseling with the patient and coordination of care, and documentation of the encounter.  Eugene Garnet, MD  Division of Gynecologic Oncology  Department of Obstetrics and Gynecology  [facility]

## 2023-07-11 ENCOUNTER — Encounter: Payer: Self-pay | Admitting: Nurse Practitioner

## 2023-07-11 ENCOUNTER — Ambulatory Visit: Payer: Medicare Other | Admitting: Nurse Practitioner

## 2023-07-11 VITALS — BP 128/78 | HR 70 | Wt 170.4 lb

## 2023-07-11 DIAGNOSIS — F5101 Primary insomnia: Secondary | ICD-10-CM

## 2023-07-11 DIAGNOSIS — E1159 Type 2 diabetes mellitus with other circulatory complications: Secondary | ICD-10-CM

## 2023-07-11 DIAGNOSIS — I152 Hypertension secondary to endocrine disorders: Secondary | ICD-10-CM | POA: Diagnosis not present

## 2023-07-11 DIAGNOSIS — E785 Hyperlipidemia, unspecified: Secondary | ICD-10-CM

## 2023-07-11 DIAGNOSIS — E1169 Type 2 diabetes mellitus with other specified complication: Secondary | ICD-10-CM

## 2023-07-11 DIAGNOSIS — R7401 Elevation of levels of liver transaminase levels: Secondary | ICD-10-CM

## 2023-07-11 DIAGNOSIS — E669 Obesity, unspecified: Secondary | ICD-10-CM

## 2023-07-11 DIAGNOSIS — H539 Unspecified visual disturbance: Secondary | ICD-10-CM

## 2023-07-11 NOTE — Patient Instructions (Addendum)
You can go up on the trazodone for sleep to see if this helps. This is still a small and safe dose. If you want to stay at the current dose that is ok too.   If you have any concerns, please contact me.

## 2023-07-11 NOTE — Progress Notes (Signed)
[name], DNP, AGNP-c [facility]  [address] [place], [place] [address] [phone]  ESTABLISHED PATIENT- Chronic Health and/or Follow-Up Visit  Blood pressure 128/78, pulse 70, weight 170 lb 6.4 oz (77.3 kg).    Julie Orozco is a 77 y.o. year old female presenting today for evaluation and management of chronic conditions.   History of Present Illness The patient, with a history of diabetes and oncological conditions, has been managing well with no recent changes in appetite or urinary frequency. She has not been monitoring her blood sugar at home. She recently had a consultation with an oncology surgeon, which went well, and she is due for another appointment in March. If the upcoming scans are satisfactory, the frequency of these appointments will be reduced to every six months.  The patient has been experiencing some discomfort with her new glasses, which were prescribed a couple of months ago. She reports feeling tired and dizzy after wearing them for extended periods. This discomfort has led her to avoid wearing the glasses altogether at times.  Regarding her sleep, the patient has been taking half a dose of Trazodone, which has been somewhat helpful. However, she has been hesitant to increase the dose to a full pill as previously suggested. She denies any numbness or burning in her feet.   All ROS negative with exception of what is listed above.   PHYSICAL EXAM Physical Exam Vitals and nursing note reviewed.  Constitutional:      General: She is not in acute distress.    Appearance: Normal appearance.  HENT:     Head: Normocephalic.  Eyes:     General: Lids are normal. Lids are everted, no foreign bodies appreciated. Vision grossly intact. No visual field deficit.    Extraocular Movements: Extraocular movements intact.     Right eye: No nystagmus.     Left eye: No nystagmus.     Conjunctiva/sclera: Conjunctivae normal.  Neck:      Vascular: No carotid bruit.  Cardiovascular:     Rate and Rhythm: Normal rate and regular rhythm.     Pulses: Normal pulses.     Heart sounds: Normal heart sounds. No murmur heard. Pulmonary:     Effort: Pulmonary effort is normal.     Breath sounds: Normal breath sounds.  Musculoskeletal:     Cervical back: No tenderness.     Right lower leg: No edema.     Left lower leg: No edema.  Skin:    General: Skin is warm and dry.     Capillary Refill: Capillary refill takes less than 2 seconds.  Neurological:     General: No focal deficit present.     Mental Status: She is alert and oriented to person, place, and time.     Motor: No weakness.  Psychiatric:        Mood and Affect: Mood normal.      PLAN Problem List Items Addressed This Visit     Hypertension associated with diabetes (HCC)    Blood pressure well controlled at this time with no concerning symptoms. Dizziness does not appear to be related to BP elevation. Will continue current treatment.       Relevant Orders   CBC with Differential/Platelet (Completed)   CMP14+EGFR (Completed)   Hemoglobin A1c (Completed)   Type 2 diabetes mellitus with other specified complication (HCC)    Diabetes management is well-controlled. No increased hunger or polyuria. No issues with current medication regimen. Not monitoring blood sugar at  home. Will check labs today for evaluation. - Continue metformin - Monitor blood sugar at home      Relevant Orders   CBC with Differential/Platelet (Completed)   CMP14+EGFR (Completed)   Hemoglobin A1c (Completed)   Hyperlipidemia associated with type 2 diabetes mellitus (HCC)    Chronic. Managed with simvastatin with no reported concerns. Labs monitored.       Insomnia    Currently taking half a pill of Trazodone at night. Discussed increasing to a full pill to improve sleep quality. - Consider increasing Trazodone to a full pill at night      Vision changes    Dizziness and fatigue with  new glasses for a couple of months suggest a possible issue with the prescription. Advised to verify prescription at [facility] and consult with [facility] if necessary. - Verify glasses prescription at [facility] - Consult [facility] if prescription is correct      Obesity (BMI 30-39.9) - Primary   Relevant Orders   CBC with Differential/Platelet (Completed)   CMP14+EGFR (Completed)   Hemoglobin A1c (Completed)   Elevated ALT measurement   Relevant Orders   CBC with Differential/Platelet (Completed)   CMP14+EGFR (Completed)   Hemoglobin A1c (Completed)    Return in about 6 months (around 01/08/2024) for Med Management 30.  [name], DNP, AGNP-c

## 2023-07-12 LAB — CMP14+EGFR
ALT: 19 [IU]/L (ref 0–32)
AST: 19 [IU]/L (ref 0–40)
Albumin: 4.3 g/dL (ref 3.8–4.8)
Alkaline Phosphatase: 121 [IU]/L (ref 44–121)
BUN/Creatinine Ratio: 23 (ref 12–28)
BUN: 14 mg/dL (ref 8–27)
Bilirubin Total: 0.2 mg/dL (ref 0.0–1.2)
CO2: 22 mmol/L (ref 20–29)
Calcium: 9.6 mg/dL (ref 8.7–10.3)
Chloride: 103 mmol/L (ref 96–106)
Creatinine, Ser: 0.61 mg/dL (ref 0.57–1.00)
Globulin, Total: 2.9 g/dL (ref 1.5–4.5)
Glucose: 104 mg/dL — ABNORMAL HIGH (ref 70–99)
Potassium: 4.5 mmol/L (ref 3.5–5.2)
Sodium: 140 mmol/L (ref 134–144)
Total Protein: 7.2 g/dL (ref 6.0–8.5)
eGFR: 93 mL/min/{1.73_m2} (ref >59)

## 2023-07-12 LAB — CBC WITH DIFFERENTIAL/PLATELET
Basophils Absolute: 0.1 10*3/uL (ref 0.0–0.2)
Basos: 1 %
EOS (ABSOLUTE): 0.1 10*3/uL (ref 0.0–0.4)
Eos: 2 %
Hematocrit: 37.9 % (ref 34.0–46.6)
Hemoglobin: 12.5 g/dL (ref 11.1–15.9)
Immature Grans (Abs): 0 10*3/uL (ref 0.0–0.1)
Immature Granulocytes: 0 %
Lymphocytes Absolute: 1.4 10*3/uL (ref 0.7–3.1)
Lymphs: 21 %
MCH: 29.9 pg (ref 26.6–33.0)
MCHC: 33 g/dL (ref 31.5–35.7)
MCV: 91 fL (ref 79–97)
Monocytes Absolute: 0.6 10*3/uL (ref 0.1–0.9)
Monocytes: 10 %
Neutrophils Absolute: 4.2 10*3/uL (ref 1.4–7.0)
Neutrophils: 66 %
Platelets: 250 10*3/uL (ref 150–450)
RBC: 4.18 x10E6/uL (ref 3.77–5.28)
RDW: 12.5 % (ref 11.7–15.4)
WBC: 6.4 10*3/uL (ref 3.4–10.8)

## 2023-07-12 LAB — HEMOGLOBIN A1C
Est. average glucose Bld gHb Est-mCnc: 148 mg/dL
Hgb A1c MFr Bld: 6.8 % — ABNORMAL HIGH (ref 4.8–5.6)

## 2023-07-19 DIAGNOSIS — H539 Unspecified visual disturbance: Secondary | ICD-10-CM | POA: Insufficient documentation

## 2023-07-19 NOTE — Assessment & Plan Note (Signed)
Currently taking half a pill of Trazodone at night. Discussed increasing to a full pill to improve sleep quality. - Consider increasing Trazodone to a full pill at night

## 2023-07-19 NOTE — Assessment & Plan Note (Signed)
Blood pressure well controlled at this time with no concerning symptoms. Dizziness does not appear to be related to BP elevation. Will continue current treatment.

## 2023-07-19 NOTE — Assessment & Plan Note (Signed)
Dizziness and fatigue with new glasses for a couple of months suggest a possible issue with the prescription. Advised to verify prescription at [facility] and consult with [facility] if necessary. - Verify glasses prescription at [facility] - Consult [facility] if prescription is correct

## 2023-07-19 NOTE — Assessment & Plan Note (Signed)
Chronic. Managed with simvastatin with no reported concerns. Labs monitored.

## 2023-07-19 NOTE — Assessment & Plan Note (Signed)
Diabetes management is well-controlled. No increased hunger or polyuria. No issues with current medication regimen. Not monitoring blood sugar at home. Will check labs today for evaluation. - Continue metformin - Monitor blood sugar at home

## 2023-09-09 ENCOUNTER — Other Ambulatory Visit: Payer: Self-pay | Admitting: Nurse Practitioner

## 2023-10-26 NOTE — Progress Notes (Signed)
 Radiation Oncology         [phone]) [phone] ________________________________  Name: Julie Orozco MRN: [id number]  Date: 10/27/2023  DOB: 1945-11-22  Follow-Up Visit Note  CC: Early, Sung Amabile, NP  Burnard Hawthorne B, PA-C  No diagnosis found.  Diagnosis:   Stage III endometrioid adenocarcinoma (pT1a, pN52mi, M0)    Interval Since Last Radiation: 2 years, 10 months, and 16 days    Radiation treatment dates:    IMRT: 10/15/20 - 11/18/20 HDR: 3/31, 4/5, 12/09/20   Site/dose:    IMRT: Pelvis / 45 Gy in 25 Gy HDR: Vaginal Cylinder, 2.5 cm / 18 Gy in 3 fractions   Beams/energy:    IMRT: 6X HDR: Ir-192, 3.0 cm length, 1 channel and 7 dwells   Narrative:  The patient returns today for routine 6 month follow-up. She was last seen here for follow-up on 04-25-23. Since her last visit, she presented for a follow up with Dr. Eugene Garnet on 07-08-23. At that time, she reported feeling well overall with not significant complains or symptoms indicating disease recurrence.    No other significant oncologic interval history since the patient was last seen.                            Allergies:  has no known allergies.  Meds: Current Outpatient Medications  Medication Sig Dispense Refill   acetaminophen (TYLENOL) 500 MG tablet Take 500-1,000 mg by mouth every 6 (six) hours as needed (pain.).     Cholecalciferol (VITAMIN D3) 25 MCG (1000 UT) CAPS Take 1,000 Units by mouth daily.     EPINEPHrine (EPIPEN 2-PAK) 0.3 mg/0.3 mL IJ SOAJ injection Inject 0.3 mg into the muscle as needed for anaphylaxis. Call 911 immediately. 2 each 3   Glucose Blood (BLOOD GLUCOSE TEST STRIPS 333) STRP For up to 4 tests per day. Patients device: Acucheck Guide Me. Please distribute per insurance preference and patient device. 200 strip 99   ibuprofen (ADVIL) 200 MG tablet Take 600 mg by mouth every 8 (eight) hours as needed (pain.).     Lancets Ultra Fine MISC Lancets for use with Acucheck guide me meter. For blood  sugar monitoring up to four times a day. 200 each 99   lisinopril (ZESTRIL) 10 MG tablet Take 1 tablet by mouth once daily 90 tablet 1   metFORMIN (GLUCOPHAGE) 500 MG tablet Take 1 tablet by mouth once daily with breakfast 90 tablet 1   simvastatin (ZOCOR) 20 MG tablet TAKE 1 TABLET BY MOUTH AT BEDTIME 90 tablet 1   traZODone (DESYREL) 50 MG tablet Take 1-2 tablets (50-100 mg total) by mouth at bedtime as needed for sleep. 60 tablet 6   No current facility-administered medications for this encounter.    Physical Findings: The patient is in no acute distress. Patient is alert and oriented.  vitals were not taken for this visit. .  No significant changes. Lungs are clear to auscultation bilaterally. Heart has regular rate and rhythm. No palpable cervical, supraclavicular, or axillary adenopathy. Abdomen soft, non-tender, normal bowel sounds.   Lab Findings: Lab Results  Component Value Date   WBC 6.4 07/11/2023   HGB 12.5 07/11/2023   HCT 37.9 07/11/2023   MCV 91 07/11/2023   PLT 250 07/11/2023    Radiographic Findings: No results found.  Impression: Stage III endometrioid adenocarcinoma (pT1a, pN11mi, M0)   The patient is recovering from the effects of radiation.  ***  Plan:  ***   ***  minutes of total time was spent for this patient encounter, including preparation, face-to-face counseling with the patient and coordination of care, physical exam, and documentation of the encounter. ____________________________________  Billie Lade, PhD, MD  This document serves as a record of services personally performed by Antony Blackbird, MD. It was created on his behalf by Herbie Saxon, a trained medical scribe. The creation of this record is based on the scribe's personal observations and the provider's statements to them. This document has been checked and approved by the attending provider.

## 2023-10-27 ENCOUNTER — Ambulatory Visit
Admission: RE | Admit: 2023-10-27 | Discharge: 2023-10-27 | Disposition: A | Discharge status: Home / Self Care | Payer: Self-pay | Source: Ambulatory Visit | Attending: Radiation Oncology | Admitting: Radiation Oncology

## 2023-10-27 ENCOUNTER — Encounter: Payer: Self-pay | Admitting: Radiation Oncology

## 2023-10-27 VITALS — BP 152/96 | HR 82 | Temp 97.1°F | Resp 18 | Ht 61.0 in | Wt 169.0 lb

## 2023-10-27 DIAGNOSIS — C541 Malignant neoplasm of endometrium: Secondary | ICD-10-CM | POA: Diagnosis not present

## 2023-10-27 DIAGNOSIS — Z8542 Personal history of malignant neoplasm of other parts of uterus: Secondary | ICD-10-CM | POA: Insufficient documentation

## 2023-10-27 DIAGNOSIS — Z923 Personal history of irradiation: Secondary | ICD-10-CM | POA: Diagnosis not present

## 2023-10-27 DIAGNOSIS — Z7984 Long term (current) use of oral hypoglycemic drugs: Secondary | ICD-10-CM | POA: Diagnosis not present

## 2023-10-27 DIAGNOSIS — Z79899 Other long term (current) drug therapy: Secondary | ICD-10-CM | POA: Diagnosis not present

## 2023-10-27 NOTE — Progress Notes (Signed)
 Julie Orozco is here today for follow up post radiation to the pelvic.  They completed their radiation on: 12/19/2020  Does the patient complain of any of the following:  Pain: Denies Abdominal bloating: Denies Diarrhea/Constipation: Denies Nausea/Vomiting: Denies Vaginal Discharge: Denies Blood in Urine or Stool: Denies Urinary Issues (dysuria/incomplete emptying/ incontinence/ increased frequency/urgency): Denies Does patient report using vaginal dilator 2-3 times a week and/or sexually active 2-3 weeks: Yes, she reports using dilators intermittently. Post radiation skin changes: Denies   BP (!) 152/96 (BP Location: Left Arm, Patient Position: Sitting)   Pulse 82   Temp (!) 97.1 F (36.2 C) (Temporal)   Resp 18   Ht 5\' 1"  (1.549 m)   Wt 169 lb (76.7 kg)   SpO2 98%   BMI 31.93 kg/m

## 2023-12-03 ENCOUNTER — Other Ambulatory Visit: Payer: Self-pay | Admitting: Nurse Practitioner

## 2024-01-02 ENCOUNTER — Telehealth: Payer: Self-pay | Admitting: *Deleted

## 2024-01-02 NOTE — Telephone Encounter (Signed)
 LMOM for the patient to call the office back. Patient needs to have appt on 8/1 to 8/29, also LMOM on 4/17

## 2024-01-05 ENCOUNTER — Encounter: Payer: Self-pay | Admitting: *Deleted

## 2024-01-05 NOTE — Telephone Encounter (Signed)
 LMOM for the patient to call the office back. Patient's appt on 8/1 was canceled and rescheduled to 8/29. Unable to reach patient or family. Mailed letter

## 2024-01-12 ENCOUNTER — Encounter: Payer: Medicare Other | Admitting: Nurse Practitioner

## 2024-02-07 ENCOUNTER — Ambulatory Visit: Admitting: Medical

## 2024-02-07 ENCOUNTER — Encounter: Payer: Self-pay | Admitting: Medical

## 2024-02-07 VITALS — BP 122/50 | HR 69 | Temp 98.5°F | Ht 60.0 in | Wt 167.8 lb

## 2024-02-07 DIAGNOSIS — H938X1 Other specified disorders of right ear: Secondary | ICD-10-CM | POA: Diagnosis not present

## 2024-02-07 DIAGNOSIS — E1159 Type 2 diabetes mellitus with other circulatory complications: Secondary | ICD-10-CM

## 2024-02-07 DIAGNOSIS — R42 Dizziness and giddiness: Secondary | ICD-10-CM

## 2024-02-07 DIAGNOSIS — E1169 Type 2 diabetes mellitus with other specified complication: Secondary | ICD-10-CM | POA: Diagnosis not present

## 2024-02-07 DIAGNOSIS — I152 Hypertension secondary to endocrine disorders: Secondary | ICD-10-CM | POA: Diagnosis not present

## 2024-02-07 DIAGNOSIS — T782XXA Anaphylactic shock, unspecified, initial encounter: Secondary | ICD-10-CM

## 2024-02-07 DIAGNOSIS — T63441A Toxic effect of venom of bees, accidental (unintentional), initial encounter: Secondary | ICD-10-CM

## 2024-02-07 DIAGNOSIS — R002 Palpitations: Secondary | ICD-10-CM | POA: Diagnosis not present

## 2024-02-07 MED ORDER — LISINOPRIL 10 MG PO TABS: 10.0000 mg | ORAL_TABLET | Freq: Every day | ORAL | 0 refills | Status: DC

## 2024-02-07 MED ORDER — EPINEPHRINE 0.3 MG/0.3ML IJ SOAJ: 0.3000 mg | INTRAMUSCULAR | 1 refills | Status: AC | PRN

## 2024-02-07 MED ORDER — MECLIZINE HCL 25 MG PO TABS: 12.5000 mg | ORAL_TABLET | Freq: Two times a day (BID) | ORAL | 0 refills | Status: AC

## 2024-02-07 MED ORDER — SIMVASTATIN 20 MG PO TABS: 20.0000 mg | ORAL_TABLET | Freq: Every day | ORAL | 0 refills | Status: DC

## 2024-02-07 MED ORDER — FLUTICASONE PROPIONATE 50 MCG/ACT NA SUSP: 2.0000 | Freq: Every day | NASAL | 1 refills | Status: DC

## 2024-02-07 NOTE — Patient Instructions (Signed)
 Dizziness, ear pressure, vertigo symptoms likely due to eustachian tube dysfunction Begin fluticasone Flonase nasal spray daily for the next 1 to 2 weeks Begin meclizine  1/2 to 1 tablet twice a daily to help with vertigo and dizziness Drink at least 80 ounces of water  a day Limit caffeine Avoid sudden motions or sudden change from sitting to standing If not improving over the next 4 to 5 days then call or recheck  Palpitations-we discussed her symptoms that are intermittent, possible causes or triggers.  EKG reviewed.  Consider event monitor.  Recheck in the near future for diabetes follow-up with her PCP  Hypertension-advised blood pressure monitoring at home.  Goal around 120/70.  Low blood pressure would be under 106/66 in her case.  For now continue lisinopril  10 mg daily  Diabetes-I refilled the new prescription for glucometer testing supplies since her insurance no more covers the 1 she has been using.  Continue metformin  500 mg daily

## 2024-02-07 NOTE — Progress Notes (Signed)
 Subjective:  Julie Orozco is a 78 y.o. female who presents for Chief Complaint  Patient presents with   Acute Visit    Vertigo X 4 days, today sxs gets very hot and cold, and is having nausea. Feels as she has to vomit. While sitting she feels dizzy. When she gets up to walk, turns her head left to right or up or down she feels as the room is spinning. No feeling of fluid in the ears, she just hears a whistling sound. Rapid heart at night x3 days.  Needs a refill on the epi-pen      Here for concerns of vertigo.   Here today with Violet, daughter.  She has had 4 days of vertigo/dizziness, feels like room is spinning.  This is caused some nausea and she gets hot and cold at times.  She does have some ear pressure on the right.  She has had vertigo in the past and it feels similar.  No sinus pressure.  No fever.  No cough.  Hasn't been checking sugars the past month or so, ran out of strips and they were too expensive.  Checks BP sometimes at home but doesn't trust the BP numbers on that machine.    Eats 2 times daily.     Drinks about 2 bottles of water  daily, some coffee  She needs a refill on her EpiPen .  She reports compliance with her cholesterol medicine blood pressure medicine and diabetes medicine and vitamin D   She notes for the past year she has intermittent palpitations.  No dizziness or lightheaded with the palpitation.  No chest pain.  No current cardiologist.  No other aggravating or relieving factors.    No other c/o.  Past Medical History:  Diagnosis Date   Asymptomatic microscopic hematuria 06/05/2020   DM type 2 (diabetes mellitus, type 2) (HCC)    Dysuria 08/09/2022   Elevated ALT measurement 04/18/2018   resolved   Elevated liver enzymes 06/27/2019   Endometrial cancer (HCC)    History of dilatation and curettage 04/29/2020   History of radiation therapy 10/15/20-11/18/20   IMRT endometrium - Dr. Retta Caster   History of radiation therapy 11/27/2020,  12/02/2020, 12/09/2020   vaginal brachytherapy - [facility]DR to endometrium      Dr Retta Caster   Hyperlipidemia    Hypertension    Intermittent palpitations 06/05/2020   Obesity    Osteopenia determined by x-ray    PMB (postmenopausal bleeding)    PMB (postmenopausal bleeding) 07/18/2013   SVD (spontaneous vaginal delivery)    x 8   Upper respiratory tract infection 10/05/2022   Vulvar candidiasis 09/16/2020   Current Outpatient Medications on File Prior to Visit  Medication Sig Dispense Refill   Cholecalciferol (VITAMIN D3) 25 MCG (1000 UT) CAPS Take 1,000 Units by mouth daily.     metFORMIN  (GLUCOPHAGE ) 500 MG tablet Take 1 tablet by mouth once daily with breakfast 90 tablet 1   acetaminophen  (TYLENOL ) 500 MG tablet Take 500-1,000 mg by mouth every 6 (six) hours as needed (pain.). (Patient not taking: Reported on 02/07/2024)     Glucose Blood (BLOOD GLUCOSE TEST STRIPS 333) STRP For up to 4 tests per day. Patients device: Acucheck Guide Me. Please distribute per insurance preference and patient device. (Patient not taking: Reported on 02/07/2024) 200 strip 99   ibuprofen  (ADVIL ) 200 MG tablet Take 600 mg by mouth every 8 (eight) hours as needed (pain.). (Patient not taking: Reported on 02/07/2024)  Lancets Ultra Fine MISC Lancets for use with Acucheck guide me meter. For blood sugar monitoring up to four times a day. (Patient not taking: Reported on 02/07/2024) 200 each 99   traZODone  (DESYREL ) 50 MG tablet Take 1-2 tablets (50-100 mg total) by mouth at bedtime as needed for sleep. (Patient not taking: Reported on 02/07/2024) 60 tablet 6   No current facility-administered medications on file prior to visit.    The following portions of the patient's history were reviewed and updated as appropriate: allergies, current medications, past family history, past medical history, past social history, past surgical history and problem list.  ROS Otherwise as in subjective above    Objective: BP (!)  122/50   Pulse 69   Temp 98.5 F (36.9 C)   Ht 5' (1.524 m)   Wt 167 lb 12.8 oz (76.1 kg)   SpO2 97%   BMI 32.77 kg/m   Wt Readings from Last 3 Encounters:  02/07/24 167 lb 12.8 oz (76.1 kg)  10/27/23 169 lb (76.7 kg)  07/11/23 170 lb 6.4 oz (77.3 kg)   BP Readings from Last 3 Encounters:  02/07/24 (!) 122/50  10/27/23 (!) 152/96  07/11/23 128/78    General appearance: alert, no distress, well developed, well nourished HEENT: normocephalic, sclerae anicteric, conjunctiva pink and moist, right TM with air-fluid levels, decreased light reflex, left TM normal, nares patent, no discharge or erythema, pharynx normal Oral cavity: MMM, no lesions, edentulous Neck: supple, no lymphadenopathy, no thyromegaly, no masses, no bruits Heart: RRR, normal S1, S2, no murmurs Lungs: CTA bilaterally, no wheezes, rhonchi, or rales Pulses: 2+ radial pulses, 1+ pedal pulses, normal cap refill Ext: no edema Neuro: A little unsteady on her feet so we do not attempt Romberg or finger-to-nose,, otherwise CN II through XII intact, nonfocal exam   EKG reviewed, normal    Assessment: Encounter Diagnoses  Name Primary?   Dizziness Yes   Ear pressure, right    Palpitation    Hypertension associated with diabetes (HCC)    Type 2 diabetes mellitus with other specified complication, without long-term current use of insulin  (HCC)    Anaphylactic reaction to bee sting, accidental or unintentional, initial encounter      Plan: Dizziness, ear pressure, vertigo symptoms likely due to eustachian tube dysfunction Begin fluticasone Flonase nasal spray daily for the next 1 to 2 weeks Begin meclizine  1/2 to 1 tablet twice a daily to help with vertigo and dizziness Drink at least 80 ounces of water  a day Limit caffeine Avoid sudden motions or sudden change from sitting to standing If not improving over the next 4 to 5 days then call or recheck  Palpitations, occasional -we discussed her symptoms that are  intermittent, possible causes or triggers.  EKG reviewed.  Consider event monitor.  Recheck in the near future for diabetes follow-up with her PCP  Hypertension-advised blood pressure monitoring at home.  Goal around 120/70.  Low blood pressure would be under 106/66 in her case.  For now continue lisinopril  10 mg daily  Diabetes-I wrote as new prescription for glucometer testing supplies since her insurance no more covers the 1 she has been using.  Continue metformin  500 mg daily  She was due for some refills as done below today  Amina was seen today for acute visit.  Diagnoses and all orders for this visit:  Dizziness -     EKG 12-Lead  Ear pressure, right  Palpitation -     EKG 12-Lead  Hypertension  associated with diabetes (HCC) -     EKG 12-Lead  Type 2 diabetes mellitus with other specified complication, without long-term current use of insulin  (HCC)  Anaphylactic reaction to bee sting, accidental or unintentional, initial encounter -     EPINEPHrine  (EPIPEN  2-PAK) 0.3 mg/0.3 mL IJ SOAJ injection; Inject 0.3 mg into the muscle as needed for anaphylaxis. Call 911 immediately.  Other orders -     fluticasone (FLONASE) 50 MCG/ACT nasal spray; Place 2 sprays into both nostrils daily. -     meclizine  (ANTIVERT ) 25 MG tablet; Take 0.5-1 tablets (12.5-25 mg total) by mouth 2 (two) times daily. -     lisinopril  (ZESTRIL ) 10 MG tablet; Take 1 tablet (10 mg total) by mouth daily. -     simvastatin  (ZOCOR ) 20 MG tablet; Take 1 tablet (20 mg total) by mouth at bedtime.    Follow up: Soon for med check with PCP

## 2024-02-08 ENCOUNTER — Ambulatory Visit: Admitting: Family Medicine

## 2024-02-13 ENCOUNTER — Encounter: Payer: Self-pay | Admitting: Nurse Practitioner

## 2024-02-13 ENCOUNTER — Ambulatory Visit: Attending: Nurse Practitioner

## 2024-02-13 ENCOUNTER — Ambulatory Visit: Admitting: Nurse Practitioner

## 2024-02-13 VITALS — BP 130/78 | HR 98 | Wt 168.8 lb

## 2024-02-13 DIAGNOSIS — R002 Palpitations: Secondary | ICD-10-CM | POA: Diagnosis not present

## 2024-02-13 DIAGNOSIS — R42 Dizziness and giddiness: Secondary | ICD-10-CM | POA: Insufficient documentation

## 2024-02-13 DIAGNOSIS — E1169 Type 2 diabetes mellitus with other specified complication: Secondary | ICD-10-CM

## 2024-02-13 DIAGNOSIS — E1159 Type 2 diabetes mellitus with other circulatory complications: Secondary | ICD-10-CM

## 2024-02-13 DIAGNOSIS — E669 Obesity, unspecified: Secondary | ICD-10-CM

## 2024-02-13 DIAGNOSIS — F5101 Primary insomnia: Secondary | ICD-10-CM

## 2024-02-13 DIAGNOSIS — I152 Hypertension secondary to endocrine disorders: Secondary | ICD-10-CM | POA: Diagnosis not present

## 2024-02-13 DIAGNOSIS — E785 Hyperlipidemia, unspecified: Secondary | ICD-10-CM

## 2024-02-13 HISTORY — DX: Dizziness and giddiness: R42

## 2024-02-13 NOTE — Progress Notes (Unsigned)
 Dell Fennel, DNP, AGNP-c [facility]  [address] [place], [place] [address] [phone]  ESTABLISHED PATIENT- Chronic Health and/or Follow-Up Visit  Blood pressure 130/78, pulse 98, weight 168 lb 12.8 oz (76.6 kg).    History of Present Illness Julie Orozco is a 78 year old female who presents with palpitations and dizziness.  She experiences palpitations almost daily, typically occurring after breakfast. During these episodes, she feels her heart beating fast and needs to lie down until the sensation passes. This has been ongoing for approximately three months. She also experiences fatigue and sometimes feels sleepy during these episodes.  She notes swelling in her feet, which is more noticeable in the afternoon and sometimes persists into the morning but improves with movement. No associated pain or soreness is reported.  She denies increased hunger or excessive thirst. She is tolerating her medications well and reports sleeping well after taking half a pill at bedtime. No shortness of breath or chest pain is reported, but she occasionally feels her heart beating too fast.    All ROS negative with exception of what is listed above.   PHYSICAL EXAM Physical Exam Vitals and nursing note reviewed.  Constitutional:      General: She is not in acute distress.    Appearance: Normal appearance. She is not ill-appearing.  HENT:     Head: Normocephalic.     Right Ear: Tympanic membrane normal.     Left Ear: Tympanic membrane normal.     Mouth/Throat:     Mouth: Mucous membranes are moist.     Pharynx: Oropharynx is clear.   Eyes:     Extraocular Movements: Extraocular movements intact.     Conjunctiva/sclera: Conjunctivae normal.     Pupils: Pupils are equal, round, and reactive to light.   Neck:     Vascular: No carotid bruit.   Cardiovascular:     Rate and Rhythm: Normal rate and regular rhythm.     Pulses: Normal pulses.     Heart  sounds: Normal heart sounds.  Pulmonary:     Effort: Pulmonary effort is normal.     Breath sounds: Normal breath sounds.   Musculoskeletal:        General: Normal range of motion.     Cervical back: Normal range of motion. No tenderness.     Right lower leg: No edema.     Left lower leg: No edema.  Lymphadenopathy:     Cervical: No cervical adenopathy.   Skin:    General: Skin is warm.     Capillary Refill: Capillary refill takes less than 2 seconds.   Neurological:     General: No focal deficit present.     Mental Status: She is alert and oriented to person, place, and time.     Motor: Weakness present.   Psychiatric:        Mood and Affect: Mood normal.        Behavior: Behavior normal.      PLAN Problem List Items Addressed This Visit     Obesity (BMI 30-39.9)   Chronic. Recommend increased physical activity, small meals with high protein and low carbs, increased water  intake, and increased protein to help with management. In the setting of hypertension, diabetes, and hyperlipidemia her risks of cardiovascular disease are elevated which does create a greater urgency for control. Will continue to monitor.       Relevant Orders   CBC with Differential/Platelet (Completed)   CMP14+EGFR (Completed)   Hypertension  associated with diabetes (HCC)   Blood pressure well controlled at this time with no concerning symptoms. Dizziness does not appear to be related to BP elevation. Will continue current treatment.       Relevant Orders   CBC with Differential/Platelet (Completed)   CMP14+EGFR (Completed)   Hemoglobin A1c (Completed)   Type 2 diabetes mellitus with other specified complication (HCC) - Primary   Diabetes management is well-controlled. No increased hunger or polyuria. No issues with current medication regimen. Not monitoring blood sugar at home. Will check labs today for evaluation. - Continue metformin  - Monitor blood sugar at home      Relevant Orders    CBC with Differential/Platelet (Completed)   CMP14+EGFR (Completed)   Hemoglobin A1c (Completed)   Microalbumin/Creatinine Ratio, Urine (Completed)   Hyperlipidemia associated with type 2 diabetes mellitus (HCC)   Chronic. Managed with simvastatin  with no reported concerns. Labs monitored.       Relevant Orders   CBC with Differential/Platelet (Completed)   CMP14+EGFR (Completed)   Hemoglobin A1c (Completed)   Intermittent palpitations   Palpitations occurring almost daily postprandially, with associated fatigue and somnolence for three months. Previous EKG was normal, but frequent symptoms raise concern for arrhythmia. - Order a 7-day heart monitor to assess for arrhythmia or abnormal heart rhythm. - Instruct her to press the button on the monitor during palpitations to mark the event. - Coordinate with cardiology for expedited placement of the heart monitor, potentially on the same day. - Advise her to seek immediate medical attention if symptoms worsen or if chest pain occurs.      Relevant Orders   CBC with Differential/Platelet (Completed)   CMP14+EGFR (Completed)   LONG TERM MONITOR (3-14 DAYS)   TSH + free T4 (Completed)   Insomnia   Currently taking Trazodone  at night. No concerns present.       Dizziness   Intermittent dizziness in the setting of daily palpitations. HRRR today with normal EKG from my partner recently. Given the symptoms I do feel that additional monitoring would be helpful. We will order 7 day cardiac monitor and evaluate closely. She is aware to contact the office or go to the ED if these symptoms worsen.       Relevant Orders   CBC with Differential/Platelet (Completed)   CMP14+EGFR (Completed)   LONG TERM MONITOR (3-14 DAYS)    Return in about 3 months (around 05/15/2024) for Med Management 30.  Dell Fennel, DNP, AGNP-c

## 2024-02-13 NOTE — Progress Notes (Unsigned)
 EP to read.

## 2024-02-13 NOTE — Patient Instructions (Addendum)
 I have ordered the heart monitor. We will let you know if you need to come here or to the cardiology office to have this put on. I need to call them and check to see if they have this in stock.   I will let you know what the labs show. We will make changes if needed.

## 2024-02-14 LAB — HEMOGLOBIN A1C
Est. average glucose Bld gHb Est-mCnc: 146 mg/dL
Hgb A1c MFr Bld: 6.7 % — ABNORMAL HIGH (ref 4.8–5.6)

## 2024-02-14 LAB — CBC WITH DIFFERENTIAL/PLATELET
Basophils Absolute: 0.1 10*3/uL (ref 0.0–0.2)
Basos: 1 %
EOS (ABSOLUTE): 0.2 10*3/uL (ref 0.0–0.4)
Eos: 4 %
Hematocrit: 39.1 % (ref 34.0–46.6)
Hemoglobin: 12.5 g/dL (ref 11.1–15.9)
Immature Grans (Abs): 0 10*3/uL (ref 0.0–0.1)
Immature Granulocytes: 0 %
Lymphocytes Absolute: 1.3 10*3/uL (ref 0.7–3.1)
Lymphs: 22 %
MCH: 30.5 pg (ref 26.6–33.0)
MCHC: 32 g/dL (ref 31.5–35.7)
MCV: 95 fL (ref 79–97)
Monocytes Absolute: 0.6 10*3/uL (ref 0.1–0.9)
Monocytes: 10 %
Neutrophils Absolute: 3.9 10*3/uL (ref 1.4–7.0)
Neutrophils: 63 %
Platelets: 227 10*3/uL (ref 150–450)
RBC: 4.1 x10E6/uL (ref 3.77–5.28)
RDW: 12.9 % (ref 11.7–15.4)
WBC: 6.1 10*3/uL (ref 3.4–10.8)

## 2024-02-14 LAB — CMP14+EGFR
ALT: 16 IU/L (ref 0–32)
AST: 17 IU/L (ref 0–40)
Albumin: 4.3 g/dL (ref 3.8–4.8)
Alkaline Phosphatase: 119 IU/L (ref 44–121)
BUN/Creatinine Ratio: 21 (ref 12–28)
BUN: 13 mg/dL (ref 8–27)
Bilirubin Total: 0.2 mg/dL (ref 0.0–1.2)
CO2: 22 mmol/L (ref 20–29)
Calcium: 9.7 mg/dL (ref 8.7–10.3)
Chloride: 103 mmol/L (ref 96–106)
Creatinine, Ser: 0.61 mg/dL (ref 0.57–1.00)
Globulin, Total: 2.8 g/dL (ref 1.5–4.5)
Glucose: 98 mg/dL (ref 70–99)
Potassium: 4.4 mmol/L (ref 3.5–5.2)
Sodium: 140 mmol/L (ref 134–144)
Total Protein: 7.1 g/dL (ref 6.0–8.5)
eGFR: 93 mL/min/{1.73_m2} (ref >59)

## 2024-02-14 LAB — TSH+FREE T4
Free T4: 1.09 ng/dL (ref 0.82–1.77)
TSH: 1.34 u[IU]/mL (ref 0.450–4.500)

## 2024-02-14 LAB — MICROALBUMIN / CREATININE URINE RATIO
Creatinine, Urine: 29.6 mg/dL
Microalb/Creat Ratio: 21 mg/g{creat} (ref 0–29)
Microalbumin, Urine: 6.1 ug/mL

## 2024-02-15 NOTE — Assessment & Plan Note (Signed)
 Palpitations occurring almost daily postprandially, with associated fatigue and somnolence for three months. Previous EKG was normal, but frequent symptoms raise concern for arrhythmia. - Order a 7-day heart monitor to assess for arrhythmia or abnormal heart rhythm. - Instruct her to press the button on the monitor during palpitations to mark the event. - Coordinate with cardiology for expedited placement of the heart monitor, potentially on the same day. - Advise her to seek immediate medical attention if symptoms worsen or if chest pain occurs.

## 2024-02-15 NOTE — Assessment & Plan Note (Signed)
 Chronic. Managed with simvastatin with no reported concerns. Labs monitored.

## 2024-02-15 NOTE — Assessment & Plan Note (Signed)
 Blood pressure well controlled at this time with no concerning symptoms. Dizziness does not appear to be related to BP elevation. Will continue current treatment.

## 2024-02-15 NOTE — Assessment & Plan Note (Signed)
 Intermittent dizziness in the setting of daily palpitations. HRRR today with normal EKG from my partner recently. Given the symptoms I do feel that additional monitoring would be helpful. We will order 7 day cardiac monitor and evaluate closely. She is aware to contact the office or go to the ED if these symptoms worsen.

## 2024-02-15 NOTE — Assessment & Plan Note (Signed)
 Currently taking Trazodone  at night. No concerns present.

## 2024-02-15 NOTE — Assessment & Plan Note (Signed)
Chronic. Recommend increased physical activity, small meals with high protein and low carbs, increased water intake, and increased protein to help with management. In the setting of hypertension, diabetes, and hyperlipidemia her risks of cardiovascular disease are elevated which does create a greater urgency for control. Will continue to monitor.

## 2024-02-15 NOTE — Assessment & Plan Note (Signed)
 Diabetes management is well-controlled. No increased hunger or polyuria. No issues with current medication regimen. Not monitoring blood sugar at home. Will check labs today for evaluation. - Continue metformin - Monitor blood sugar at home

## 2024-02-21 ENCOUNTER — Ambulatory Visit: Payer: Self-pay | Admitting: Nurse Practitioner

## 2024-02-21 DIAGNOSIS — I471 Supraventricular tachycardia, unspecified: Secondary | ICD-10-CM

## 2024-03-20 DIAGNOSIS — R42 Dizziness and giddiness: Secondary | ICD-10-CM | POA: Diagnosis not present

## 2024-03-20 DIAGNOSIS — R002 Palpitations: Secondary | ICD-10-CM | POA: Diagnosis not present

## 2024-03-30 ENCOUNTER — Ambulatory Visit: Payer: Medicare Other | Admitting: Gynecologic Oncology

## 2024-04-03 DIAGNOSIS — R002 Palpitations: Secondary | ICD-10-CM

## 2024-04-03 DIAGNOSIS — R42 Dizziness and giddiness: Secondary | ICD-10-CM | POA: Diagnosis not present

## 2024-04-05 DIAGNOSIS — I471 Supraventricular tachycardia, unspecified: Secondary | ICD-10-CM | POA: Insufficient documentation

## 2024-04-17 ENCOUNTER — Ambulatory Visit: Payer: Self-pay

## 2024-04-17 ENCOUNTER — Encounter: Payer: Self-pay | Admitting: Medical

## 2024-04-17 ENCOUNTER — Ambulatory Visit: Admitting: Medical

## 2024-04-17 VITALS — BP 126/78 | HR 80 | Ht 61.0 in | Wt 166.0 lb

## 2024-04-17 DIAGNOSIS — R300 Dysuria: Secondary | ICD-10-CM

## 2024-04-17 DIAGNOSIS — N3000 Acute cystitis without hematuria: Secondary | ICD-10-CM | POA: Diagnosis not present

## 2024-04-17 DIAGNOSIS — R0982 Postnasal drip: Secondary | ICD-10-CM | POA: Diagnosis not present

## 2024-04-17 DIAGNOSIS — J029 Acute pharyngitis, unspecified: Secondary | ICD-10-CM

## 2024-04-17 LAB — POCT URINALYSIS DIP (PROADVANTAGE DEVICE)
Bilirubin, UA: NEGATIVE — NL
Glucose, UA: NEGATIVE mg/dL
Ketones, POC UA: NEGATIVE mg/dL
Nitrite, UA: NEGATIVE
Protein Ur, POC: 100 mg/dL — AB
Specific Gravity, Urine: 1.01
Urobilinogen, Ur: 0.2
pH, UA: 6 (ref 5.0–8.0)

## 2024-04-17 MED ORDER — SULFAMETHOXAZOLE-TRIMETHOPRIM 800-160 MG PO TABS: 1.0000 | ORAL_TABLET | Freq: Two times a day (BID) | ORAL | 0 refills | Status: DC

## 2024-04-17 NOTE — Telephone Encounter (Signed)
 Appointment today 04/17/2024 at 2 PM with Alm Gent PA  FYI Only or Action Required?: FYI only for provider.  Patient was last seen in primary care on 02/13/2024 by Early, Camie BRAVO, NP.  Called Nurse Triage reporting Burning with Urination.  Symptoms began 4 days ago.  Interventions attempted: Rest, hydration, or home remedies.  Symptoms are: unchanged.  Triage Disposition: See Physician Within 24 Hours  Patient/caregiver understands and will follow disposition?: Yes                   Copied from CRM #[id number]. Topic: Clinical - Red Word Triage >> Apr 17, 2024  8:57 AM Graeme ORN wrote: Red Word that prompted transfer to Nurse Triage: Burning and frequent urination since Friday Reason for Disposition  Age > 50 years  Answer Assessment - Initial Assessment Questions Patient's daughter called in to make an appointment for her mother (the patient)--daughter asked for an appointment later in the afternoon due to work. Daughter is also advised that if anything changes to give us  a call and if anything gets worse to take the patient to the Emergency Room. She verbalized understanding.    1. SEVERITY: How bad is the pain?  (e.g., Scale 1-10; mild, moderate, or severe)     Just burns for the past 4 days 2. FREQUENCY: How many times have you had painful urination today?      unknown 3. PATTERN: Is pain present every time you urinate or just sometimes?      ----- 4. ONSET: When did the painful urination start?      Friday (4 days ago) 5. FEVER: Do you have a fever? If Yes, ask: What is your temperature, how was it measured, and when did it start?     No 6. PAST UTI: Have you had a urine infection before? If Yes, ask: When was the last time? and What happened that time?      Yes 7. CAUSE: What do you think is causing the painful urination?  (e.g., UTI, scratch, Herpes sore)     unknown 8. OTHER SYMPTOMS: Do you have any other symptoms? (e.g.,  blood in urine, flank pain, genital sores, urgency, vaginal discharge)     No  Protocols used: Urination Pain - Female-A-AH

## 2024-04-17 NOTE — Progress Notes (Signed)
 Subjective:  Julie Orozco is a 78 y.o. female who presents for Chief Complaint  Patient presents with   Follow-up    Dysuria and sore throat since 04/13/24     Here for c/o dysuria and notes a sore throat as well.  They both started about the same time.  Here with daughter Julie Orozco who interprets  Since Friday 4 days ago having warm urine sensation initially, and now has burning and discomfort with urinate.  No blood in urine.  Has had some cloudy urine.  No fever, no body aches. Has had some chills.   1 day had some back discomfort.     Last UTI unsure.   No prior hospitalization for UTI.    Having some sore throat as well.  Started about 4 days ago.   No congestion, no cough, no ear pain, no headaches.  Not using anything for symptoms.   No sick contacts.    No other aggravating or relieving factors.    No other c/o.  Past Medical History:  Diagnosis Date   Asymptomatic microscopic hematuria 06/05/2020   DM type 2 (diabetes mellitus, type 2) (HCC)    Dysuria 08/09/2022   Elevated ALT measurement 04/18/2018   resolved   Elevated liver enzymes 06/27/2019   Endometrial cancer (HCC)    History of dilatation and curettage 04/29/2020   History of radiation therapy 10/15/20-11/18/20   IMRT endometrium - Dr. Lynwood Nasuti   History of radiation therapy 11/27/2020, 12/02/2020, 12/09/2020   vaginal brachytherapy - HDR to endometrium      Dr Lynwood Nasuti   Hyperlipidemia    Hypertension    Intermittent palpitations 06/05/2020   Obesity    Osteopenia determined by x-ray    PMB (postmenopausal bleeding)    PMB (postmenopausal bleeding) 07/18/2013   SVD (spontaneous vaginal delivery)    x 8   Upper respiratory tract infection 10/05/2022   Vulvar candidiasis 09/16/2020   Current Outpatient Medications on File Prior to Visit  Medication Sig Dispense Refill   acetaminophen  (TYLENOL ) 500 MG tablet Take 500-1,000 mg by mouth every 6 (six) hours as needed (pain.).     Cholecalciferol  (VITAMIN D3) 25 MCG (1000 UT) CAPS Take 1,000 Units by mouth daily.     EPINEPHrine  (EPIPEN  2-PAK) 0.3 mg/0.3 mL IJ SOAJ injection Inject 0.3 mg into the muscle as needed for anaphylaxis. Call 911 immediately. 2 each 1   ibuprofen  (ADVIL ) 200 MG tablet Take 600 mg by mouth every 8 (eight) hours as needed (pain.).     lisinopril  (ZESTRIL ) 10 MG tablet Take 1 tablet (10 mg total) by mouth daily. 90 tablet 0   metFORMIN  (GLUCOPHAGE ) 500 MG tablet Take 1 tablet by mouth once daily with breakfast 90 tablet 1   simvastatin  (ZOCOR ) 20 MG tablet Take 1 tablet (20 mg total) by mouth at bedtime. 90 tablet 0   traZODone  (DESYREL ) 50 MG tablet Take 1-2 tablets (50-100 mg total) by mouth at bedtime as needed for sleep. 60 tablet 6   fluticasone  (FLONASE ) 50 MCG/ACT nasal spray Place 2 sprays into both nostrils daily. (Patient not taking: Reported on 04/17/2024) 16 g 1   Glucose Blood (BLOOD GLUCOSE TEST STRIPS 333) STRP For up to 4 tests per day. Patients device: Acucheck Guide Me. Please distribute per insurance preference and patient device. (Patient not taking: Reported on 02/13/2024) 200 strip 99   Lancets Ultra Fine MISC Lancets for use with Acucheck guide me meter. For blood sugar monitoring up to four  times a day. (Patient not taking: Reported on 04/17/2024) 200 each 99   meclizine  (ANTIVERT ) 25 MG tablet Take 0.5-1 tablets (12.5-25 mg total) by mouth 2 (two) times daily. (Patient not taking: Reported on 04/17/2024) 30 tablet 0   No current facility-administered medications on file prior to visit.   Past Surgical History:  Procedure Laterality Date   DILATATION & CURETTAGE/HYSTEROSCOPY WITH MYOSURE N/A 07/18/2020   Procedure: DILATATION & CURETTAGE/HYSTEROSCOPY WITH MYOSURE;  Surgeon: Lavoie, Marie-Lyne, MD;  Location: [facility];  Service: Gynecology;  Laterality: N/A;   DILATION AND CURETTAGE OF UTERUS N/A 01/25/2014   Procedure: DILATATION AND CURETTAGE;  Surgeon: Harland JAYSON Birkenhead, MD;   Location: [facility];  Service: Gynecology;  Laterality: N/A;   MULTIPLE TOOTH EXTRACTIONS  yrs ago   ROBOTIC ASSISTED TOTAL HYSTERECTOMY WITH BILATERAL SALPINGO OOPHERECTOMY N/A 08/27/2020   Procedure: XI ROBOTIC ASSISTED TOTAL HYSTERECTOMY WITH BILATERAL SALPINGO OOPHORECTOMY,;  Surgeon: Viktoria Comer SAUNDERS, MD;  Location: [facility];  Service: Gynecology;  Laterality: N/A;   SENTINEL NODE BIOPSY N/A 08/27/2020   Procedure: SENTINEL LYMPH NODE BIOPSY;  Surgeon: Viktoria Comer SAUNDERS, MD;  Location: [facility];  Service: Gynecology;  Laterality: N/A;   TUBAL LIGATION  yrs ago     The following portions of the patient's history were reviewed and updated as appropriate: allergies, current medications, past family history, past medical history, past social history, past surgical history and problem list.  ROS Otherwise as in subjective above   Objective: BP 126/78 (BP Location: Right Arm, Patient Position: Sitting)   Pulse 80   Ht 5' 1 (1.549 m)   Wt 166 lb (75.3 kg)   SpO2 96%   BMI 31.37 kg/m   General appearance: alert, no distress, well developed, well nourished HEENT: normocephalic, sclerae anicteric, conjunctiva pink and moist, TMs pearly, nares patent, no discharge or erythema, pharynx with mild erythema and some post nasal drainage Oral cavity: MMM, no lesions Neck: supple, no lymphadenopathy, no thyromegaly, no masses Heart: RRR, normal S1, S2, no murmurs Lungs: CTA bilaterally, no wheezes, rhonchi, or rales Abdomen: +bs, soft, non tender, non distended, no masses, no hepatomegaly, no splenomegaly Pulses: 2+ radial pulses, 2+ pedal pulses, normal cap refill Ext: no edema   Assessment: Encounter Diagnoses  Name Primary?   Dysuria Yes   Sore throat    Acute cystitis without hematuria    Post-nasal drainage      Plan: Dysuria, UTI-begin Bactrim  empirically.  Urine culture sent.  Increase water  intake.  If symptoms are worse in the meantime call or recheck.  Otherwise we will  call in the next 2 to 3 days with culture results  Sore throat, postnasal drainage-advised she use some of her hydroxyzine at home for the next few days to help with drainage, salt water  gargles, warm fluids, Tylenol  as needed for pain, can use sore throat spray over-the-counter.  If worsening or not improving over the next 5 to 7 days then recheck    Sharice was seen today for follow-up.  Diagnoses and all orders for this visit:  Dysuria -     Urine Culture -     POCT Urinalysis DIP (Proadvantage Device)  Sore throat  Acute cystitis without hematuria  Post-nasal drainage  Other orders -     sulfamethoxazole -trimethoprim  (BACTRIM  DS) 800-160 MG tablet; Take 1 tablet by mouth 2 (two) times daily.    Follow up: pending culture

## 2024-04-19 LAB — URINE CULTURE

## 2024-04-20 ENCOUNTER — Ambulatory Visit: Payer: Self-pay | Admitting: Medical

## 2024-04-20 NOTE — Progress Notes (Signed)
 Results thru my chart

## 2024-04-24 ENCOUNTER — Telehealth: Payer: Self-pay | Admitting: *Deleted

## 2024-04-24 NOTE — Telephone Encounter (Signed)
 Unable to reach patient through [employer] id# [phone]. Left voicemail reminding patient of her follow up appointment with Dr. Viktoria, this Friday,  8/29 at 4 pm with arrival time of 3:45. Request call back to [phone] for meaningful use.

## 2024-04-26 ENCOUNTER — Ambulatory Visit: Payer: Self-pay | Admitting: Radiation Oncology

## 2024-04-27 ENCOUNTER — Inpatient Hospital Stay: Attending: Gynecologic Oncology | Admitting: Gynecologic Oncology

## 2024-04-27 ENCOUNTER — Encounter: Payer: Self-pay | Admitting: Gynecologic Oncology

## 2024-04-27 VITALS — BP 136/64 | HR 70 | Temp 98.4°F | Resp 20 | Wt 164.8 lb

## 2024-04-27 DIAGNOSIS — Z9079 Acquired absence of other genital organ(s): Secondary | ICD-10-CM | POA: Insufficient documentation

## 2024-04-27 DIAGNOSIS — Z923 Personal history of irradiation: Secondary | ICD-10-CM | POA: Diagnosis not present

## 2024-04-27 DIAGNOSIS — Z90722 Acquired absence of ovaries, bilateral: Secondary | ICD-10-CM | POA: Diagnosis not present

## 2024-04-27 DIAGNOSIS — Z90710 Acquired absence of both cervix and uterus: Secondary | ICD-10-CM | POA: Diagnosis not present

## 2024-04-27 DIAGNOSIS — Z08 Encounter for follow-up examination after completed treatment for malignant neoplasm: Secondary | ICD-10-CM | POA: Insufficient documentation

## 2024-04-27 DIAGNOSIS — Z8542 Personal history of malignant neoplasm of other parts of uterus: Secondary | ICD-10-CM | POA: Insufficient documentation

## 2024-04-27 DIAGNOSIS — C541 Malignant neoplasm of endometrium: Secondary | ICD-10-CM

## 2024-04-27 NOTE — Patient Instructions (Signed)
 It was good to see you today.  I do not see or feel any evidence of cancer recurrence on your exam.  We will see you for follow-up in 12 months.  As always, if you develop any new and concerning symptoms before your next visit, please call to see me sooner.

## 2024-04-27 NOTE — Progress Notes (Signed)
 Gynecologic Oncology Return Clinic Visit  04/27/24  Reason for Visit: surveillance visit in the setting of advanced stage uterine cancer   Treatment History: Oncology History Overview Note  IHC - loss of MLH1 and PMS2 MLH1 promoter hypermethylation present   Endometrial adenocarcinoma (HCC)  07/18/2020 Surgery   D&C: grade 1 EMCA    Initial Diagnosis   Endometrial adenocarcinoma (HCC)   08/27/2020 Surgery   Robotic-assisted laparoscopic total hysterectomy with bilateral salpingoophorectomy, SLN biopsy    08/27/2020 Pathology Results   A. SENTINEL LYMPH NODE, RIGHT OBTURATOR, BIOPSY:  - Lymph node, negative for carcinoma (0/1)   B. SENTINEL LYMPH NODE, RIGHT EXTERNAL ILIAC, BIOPSY:  - Lymph node, negative for carcinoma (0/1)   C. 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 UTERUS, CERVIX, BILATERAL TUBES AND OVARIES:  - Endometrioid adenocarcinoma, FIGO grade 2, see comment  - Carcinoma invades for a depth of 0.6 cm with a myometrial thickness is  2.0 cm  - Benign unremarkable cervix  - Benign bilateral fallopian tubes and ovaries  - See oncology table    COMMENT:   C.   Immunostain for pancytokeratin (AE1/AE3) highlights focus of  metastatic carcinoma.   D.  Immunohistochemical stains show that the tumor cells show wild-type  (normal) expression pattern for p53 and are negative for p16, consistent  with above interpretation.  The tumor shows 2 different morphologies  with the majority tumor showing a FIGO grade 1 endometrial  adenocarcinoma and a second component with FIGO grade 2 features.  Dr.  Alvaro has reviewed part C and D and concurs with the diagnosis.  Dr.  Viktoria was notified on 09/02/2020.      ONCOLOGY TABLE:   UTERUS, CARCINOMA OR CARCINOSARCOMA: Resection   Procedure: Total hysterectomy and bilateral salpingo-oophorectomy  Histologic Type: Endometrioid adenocarcinoma  Histologic  Grade: FIGO grade 2  Myometrial Invasion:       Depth of Myometrial Invasion (mm): 6 mm       Myometrial Thickness (mm): 20 mm       Percentage of Myometrial Invasion: Less than 50%  Uterine Serosa Involvement: Not identified  Cervical stromal Involvement: Not identified  Extent of involvement of other tissue/organs: Not identified  Peritoneal/Ascitic Fluid: Not applicable  Lymphovascular Invasion: Not identified  Regional Lymph Nodes:       Pelvic Lymph Nodes Examined:  3 Sentinel             0 Non-sentinel             3 Total       Pelvic Lymph Nodes with Metastasis: 1             Macrometastasis: (>2.0 mm): 0             Micrometastasis: (>0.2 mm and < 2.0 mm): 1             Isolated Tumor Cells (<0.2 mm): 0             Laterality of Lymph Node with Tumor: Left             Extracapsular Extension: Not identified       Para-aortic Lymph Nodes Examined:              0 Sentinel              0 Non-sentinel              0  Total  Distant Metastasis:       Distant Site(s) Involved: Not applicable  Pathologic Stage Classification (pTNM, AJCC 8th Edition): pT1a, pN20mi  Ancillary Studies: MMR / MSI testing will be ordered  Representative Tumor Block: D5  Comment(s): Pancytokeratin was performed on the lymph nodes.    08/27/2020 Cancer Staging   Staging form: Corpus Uteri - Carcinoma and Carcinosarcoma, AJCC 8th Edition - Clinical stage from 08/27/2020: FIGO Stage IIIC1 (cT1a, cN62mi, cM0) - Signed by Viktoria Comer SAUNDERS, MD on 09/03/2020   10/15/2020 - 12/09/2020 Radiation Therapy   Radiation treatment dates:    IMRT: 10/15/20 - 11/18/20 HDR: 3/31, 4/5, 12/09/20   Site/dose:    IMRT: Pelvis / 45 Gy in 25 Gy HDR: Vaginal Cylinder, 2.5 cm / 18 Gy in 3 fractions   Beams/energy:    IMRT: 6X HDR: Ir-192, 3.0 cm length, 1 channel and 7 dwells     Interval History: Doing well.  Reports 1 episode of possible spotting versus blood in her urine during recent episode where she had  infection.  Urinary symptoms resolved with antibiotics.  Denies any further bleeding.  Denies any pelvic or abdominal pain.  Reports regular bowel function.  Past Medical/Surgical History: Past Medical History:  Diagnosis Date   Asymptomatic microscopic hematuria 06/05/2020   DM type 2 (diabetes mellitus, type 2) (HCC)    Dysuria 08/09/2022   Elevated ALT measurement 04/18/2018   resolved   Elevated liver enzymes 06/27/2019   Endometrial cancer (HCC)    History of dilatation and curettage 04/29/2020   History of radiation therapy 10/15/20-11/18/20   IMRT endometrium - Dr. Lynwood Nasuti   History of radiation therapy 11/27/2020, 12/02/2020, 12/09/2020   vaginal brachytherapy - HDR to endometrium      Dr Lynwood Nasuti   Hyperlipidemia    Hypertension    Intermittent palpitations 06/05/2020   Obesity    Osteopenia determined by x-ray    PMB (postmenopausal bleeding)    PMB (postmenopausal bleeding) 07/18/2013   SVD (spontaneous vaginal delivery)    x 8   Upper respiratory tract infection 10/05/2022   Vulvar candidiasis 09/16/2020    Past Surgical History:  Procedure Laterality Date   DILATATION & CURETTAGE/HYSTEROSCOPY WITH MYOSURE N/A 07/18/2020   Procedure: DILATATION & CURETTAGE/HYSTEROSCOPY WITH MYOSURE;  Surgeon: Lavoie, Marie-Lyne, MD;  Location: [facility];  Service: Gynecology;  Laterality: N/A;   DILATION AND CURETTAGE OF UTERUS N/A 01/25/2014   Procedure: DILATATION AND CURETTAGE;  Surgeon: Harland JAYSON Birkenhead, MD;  Location: [facility];  Service: Gynecology;  Laterality: N/A;   MULTIPLE TOOTH EXTRACTIONS  yrs ago   ROBOTIC ASSISTED TOTAL HYSTERECTOMY WITH BILATERAL SALPINGO OOPHERECTOMY N/A 08/27/2020   Procedure: XI ROBOTIC ASSISTED TOTAL HYSTERECTOMY WITH BILATERAL SALPINGO OOPHORECTOMY,;  Surgeon: Viktoria Comer SAUNDERS, MD;  Location: [facility];  Service: Gynecology;  Laterality: N/A;   SENTINEL NODE BIOPSY N/A 08/27/2020   Procedure: SENTINEL LYMPH NODE BIOPSY;  Surgeon:  Viktoria Comer SAUNDERS, MD;  Location: [facility];  Service: Gynecology;  Laterality: N/A;   TUBAL LIGATION  yrs ago    Family History  Problem Relation Age of Onset   Hypertension Mother    Alcohol abuse Father    Anemia Brother    Alcohol abuse Brother    Breast cancer Cousin        maternal cousin   Colon cancer Neg Hx    Esophageal cancer Neg Hx    Rectal cancer Neg Hx    Stomach cancer Neg Hx  Ovarian cancer Neg Hx     Social History   Socioeconomic History   Marital status: Divorced    Spouse name: Not on file   Number of children: Not on file   Years of education: Not on file   Highest education level: Not on file  Occupational History   Not on file  Tobacco Use   Smoking status: Never   Smokeless tobacco: Never  Vaping Use   Vaping status: Never Used  Substance and Sexual Activity   Alcohol use: No   Drug use: No   Sexual activity: Not Currently    Birth control/protection: Post-menopausal  Other Topics Concern   Not on file  Social History Narrative   Not on file   Social Drivers of Health   Financial Resource Strain: Low Risk  (03/29/2023)   Overall Financial Resource Strain (CARDIA)    Difficulty of Paying Living Expenses: Not hard at all  Food Insecurity: No Food Insecurity (03/29/2023)   Hunger Vital Sign    Worried About Running Out of Food in the Last Year: Never true    Ran Out of Food in the Last Year: Never true  Transportation Needs: No Transportation Needs (03/29/2023)   PRAPARE - Administrator, Civil Service (Medical): No    Lack of Transportation (Non-Medical): No  Physical Activity: Insufficiently Active (03/29/2023)   Exercise Vital Sign    Days of Exercise per Week: 7 days    Minutes of Exercise per Session: 20 min  Stress: No Stress Concern Present (03/29/2023)   [employer] of Occupational Health - Occupational Stress Questionnaire    Feeling of Stress : Not at all  Social Connections: Moderately Isolated (03/29/2023)    Social Connection and Isolation Panel    Frequency of Communication with Friends and Family: More than three times a week    Frequency of Social Gatherings with Friends and Family: Twice a week    Attends Religious Services: More than 4 times per year    Active Member of [employer] or Organizations: No    Attends Banker Meetings: Never    Marital Status: Divorced    Current Medications:  Current Outpatient Medications:    acetaminophen  (TYLENOL ) 500 MG tablet, Take 500-1,000 mg by mouth every 6 (six) hours as needed (pain.)., Disp: , Rfl:    Cholecalciferol (VITAMIN D3) 25 MCG (1000 UT) CAPS, Take 1,000 Units by mouth daily., Disp: , Rfl:    EPINEPHrine  (EPIPEN  2-PAK) 0.3 mg/0.3 mL IJ SOAJ injection, Inject 0.3 mg into the muscle as needed for anaphylaxis. Call 911 immediately., Disp: 2 each, Rfl: 1   fluticasone  (FLONASE ) 50 MCG/ACT nasal spray, Place 2 sprays into both nostrils daily. (Patient not taking: Reported on 04/17/2024), Disp: 16 g, Rfl: 1   Glucose Blood (BLOOD GLUCOSE TEST STRIPS 333) STRP, For up to 4 tests per day. Patients device: Acucheck Guide Me. Please distribute per insurance preference and patient device. (Patient not taking: Reported on 02/13/2024), Disp: 200 strip, Rfl: 99   ibuprofen  (ADVIL ) 200 MG tablet, Take 600 mg by mouth every 8 (eight) hours as needed (pain.)., Disp: , Rfl:    Lancets Ultra Fine MISC, Lancets for use with Acucheck guide me meter. For blood sugar monitoring up to four times a day. (Patient not taking: Reported on 04/17/2024), Disp: 200 each, Rfl: 99   lisinopril  (ZESTRIL ) 10 MG tablet, Take 1 tablet (10 mg total) by mouth daily., Disp: 90 tablet, Rfl: 0   meclizine  (ANTIVERT )  25 MG tablet, Take 0.5-1 tablets (12.5-25 mg total) by mouth 2 (two) times daily. (Patient not taking: Reported on 04/17/2024), Disp: 30 tablet, Rfl: 0   metFORMIN  (GLUCOPHAGE ) 500 MG tablet, Take 1 tablet by mouth once daily with breakfast, Disp: 90 tablet, Rfl: 1    simvastatin  (ZOCOR ) 20 MG tablet, Take 1 tablet (20 mg total) by mouth at bedtime., Disp: 90 tablet, Rfl: 0   sulfamethoxazole -trimethoprim  (BACTRIM  DS) 800-160 MG tablet, Take 1 tablet by mouth 2 (two) times daily., Disp: 14 tablet, Rfl: 0   traZODone  (DESYREL ) 50 MG tablet, Take 1-2 tablets (50-100 mg total) by mouth at bedtime as needed for sleep., Disp: 60 tablet, Rfl: 6  Review of Systems: Denies appetite changes, fevers, chills, fatigue, unexplained weight changes. Denies hearing loss, neck lumps or masses, mouth sores, ringing in ears or voice changes. Denies cough or wheezing.  Denies shortness of breath. Denies chest pain or palpitations. Denies leg swelling. Denies abdominal distention, pain, blood in stools, constipation, diarrhea, nausea, vomiting, or early satiety. Denies pain with intercourse, dysuria, frequency, hematuria or incontinence. Denies hot flashes, pelvic pain, vaginal bleeding or vaginal discharge.   Denies joint pain, back pain or muscle pain/cramps. Denies itching, rash, or wounds. Denies dizziness, headaches, numbness or seizures. Denies swollen lymph nodes or glands, denies easy bruising or bleeding. Denies anxiety, depression, confusion, or decreased concentration.  Physical Exam: BP (!) 147/58 (BP Location: Left Arm, Patient Position: Sitting)   Pulse 70   Temp 98.4 F (36.9 C) (Oral)   Resp 20   Wt 164 lb 12.8 oz (74.8 kg)   SpO2 98%   BMI 31.14 kg/m  General: Alert, oriented, no acute distress. HEENT: Normocephalic, atraumatic, sclera anicteric. Chest: Clear to auscultation bilaterally.  No wheezes or rhonchi. Cardiovascular: Regular rate and rhythm, no murmurs. Abdomen: soft, nontender.  Normoactive bowel sounds.  No masses or hepatosplenomegaly appreciated.  Well-healed incisions. Extremities: Grossly normal range of motion.  Warm, well perfused.  No edema bilaterally. Skin: No rashes or lesions noted. Lymphatics: No cervical, supraclavicular, or  inguinal adenopathy. GU: Normal appearing external genitalia without erythema, excoriation, or lesions.  Speculum exam reveals moderately atrophic vaginal mucosa, radiation changes noted.  Shortened vagina measuring approximately 4 cm.  No masses or lesions appreciated.  Bimanual exam reveals cuff intact, no nodularity or masses.  Rectovaginal exam confirms these findings.  Laboratory & Radiologic Studies: None new  Assessment & Plan: Julie Orozco is a 78 y.o. 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 MMR IHC loss of MLH1/PMS, MLH1 promoter hypermethylation present.   Patient is overall doing well. NED on exam.   We discussed signs and symptoms that would be concerning for disease recurrence, and the patient knows to call if she develops any of these between visits.     Per NCCN surveillance recommendations, we will continue with visits every 6 months. She is scheduled to see Dr. Shannon for follow-up in February.  We will see her back for follow-up in 03/2025.  20 minutes of total time was spent for this patient encounter, including preparation, face-to-face counseling with the patient and coordination of care, and documentation of the encounter.  Comer Dollar, MD  Division of Gynecologic Oncology  Department of Obstetrics and Gynecology  [facility]

## 2024-05-01 ENCOUNTER — Encounter: Payer: Self-pay | Admitting: Nurse Practitioner

## 2024-05-01 ENCOUNTER — Telehealth: Payer: Self-pay

## 2024-05-01 ENCOUNTER — Other Ambulatory Visit: Payer: Self-pay

## 2024-05-01 ENCOUNTER — Ambulatory Visit: Admitting: Nurse Practitioner

## 2024-05-01 VITALS — BP 132/84 | HR 96 | Wt 166.6 lb

## 2024-05-01 DIAGNOSIS — G4700 Insomnia, unspecified: Secondary | ICD-10-CM | POA: Diagnosis not present

## 2024-05-01 DIAGNOSIS — Z1211 Encounter for screening for malignant neoplasm of colon: Secondary | ICD-10-CM | POA: Diagnosis not present

## 2024-05-01 DIAGNOSIS — E1169 Type 2 diabetes mellitus with other specified complication: Secondary | ICD-10-CM

## 2024-05-01 DIAGNOSIS — E669 Obesity, unspecified: Secondary | ICD-10-CM | POA: Diagnosis not present

## 2024-05-01 DIAGNOSIS — I152 Hypertension secondary to endocrine disorders: Secondary | ICD-10-CM

## 2024-05-01 DIAGNOSIS — E1159 Type 2 diabetes mellitus with other circulatory complications: Secondary | ICD-10-CM

## 2024-05-01 DIAGNOSIS — E559 Vitamin D deficiency, unspecified: Secondary | ICD-10-CM

## 2024-05-01 DIAGNOSIS — Z23 Encounter for immunization: Secondary | ICD-10-CM | POA: Diagnosis not present

## 2024-05-01 DIAGNOSIS — E785 Hyperlipidemia, unspecified: Secondary | ICD-10-CM | POA: Diagnosis not present

## 2024-05-01 LAB — LIPID PANEL

## 2024-05-01 MED ORDER — LANCETS MISC. MISC: 1.0000 | Freq: Three times a day (TID) | 99 refills | Status: DC

## 2024-05-01 MED ORDER — METFORMIN HCL 500 MG PO TABS: 500.0000 mg | ORAL_TABLET | Freq: Every day | ORAL | 3 refills | Status: AC

## 2024-05-01 MED ORDER — LANCET DEVICE MISC
1.0000 | Freq: Every day | 0 refills | Status: AC
Start: 2024-05-01 — End: 2024-05-31

## 2024-05-01 MED ORDER — BLOOD GLUCOSE TEST VI STRP: 1.0000 | ORAL_STRIP | Freq: Every day | 5 refills | Status: AC

## 2024-05-01 MED ORDER — BLOOD GLUCOSE MONITORING SUPPL DEVI: 1.0000 | Freq: Every day | 0 refills | Status: AC

## 2024-05-01 MED ORDER — LANCETS MISC. MISC: 1.0000 | Freq: Every day | 5 refills | Status: AC

## 2024-05-01 MED ORDER — LISINOPRIL 10 MG PO TABS: 10.0000 mg | ORAL_TABLET | Freq: Every day | ORAL | 3 refills | Status: AC

## 2024-05-01 MED ORDER — SIMVASTATIN 20 MG PO TABS: 20.0000 mg | ORAL_TABLET | Freq: Every day | ORAL | 3 refills | Status: AC

## 2024-05-01 MED ORDER — TRAZODONE HCL 50 MG PO TABS: 50.0000 mg | ORAL_TABLET | Freq: Every evening | ORAL | 3 refills | Status: AC | PRN

## 2024-05-01 MED ORDER — LANCET DEVICE MISC: 1.0000 | Freq: Three times a day (TID) | 0 refills | Status: DC

## 2024-05-01 MED ORDER — BLOOD GLUCOSE TEST VI STRP: 1.0000 | ORAL_STRIP | Freq: Three times a day (TID) | 99 refills | Status: DC

## 2024-05-01 MED ORDER — BLOOD GLUCOSE MONITORING SUPPL DEVI: 1.0000 | Freq: Three times a day (TID) | 0 refills | Status: DC

## 2024-05-01 NOTE — Assessment & Plan Note (Signed)
 Chronic. Managed with simvastatin with no reported concerns. Labs monitored.

## 2024-05-01 NOTE — Assessment & Plan Note (Signed)
 Recheck labs today. No concerns present.

## 2024-05-01 NOTE — Assessment & Plan Note (Signed)
 Hypertension is well-controlled with no recent episodes of chest pain or palpitations. - Send refills for medications to the pharmacy

## 2024-05-01 NOTE — Telephone Encounter (Signed)
 All prescriptions were fixed and sent back in with the requested information.   Copied from CRM #[id number]. Topic: Clinical - Prescription Issue >> May 01, 2024 11:45 AM Graeme ORN wrote: Reason for CRM: Received call from pharmacy. Received Rx for testing supplies and meter. Refills listed as needed. Insurance will not accept that can have maximum of 5 refills. Also says test 3 times a day - not able to have more than once per day listed if not insulin  dependant. Need corrected and reset - all 3 Rx. Thank You

## 2024-05-01 NOTE — Assessment & Plan Note (Signed)
 Sleeping well with PRN trazodone . No concerns present today.

## 2024-05-01 NOTE — Assessment & Plan Note (Signed)
 Type 2 diabetes mellitus without reported symptoms of hyperglycemia. No recent blood sugar monitoring due to lack of a meter. No dizziness reported. Daily blood sugar monitoring is not necessary unless symptomatic. - Prescribe a blood glucose meter for home use in case of symptoms - Send refills for metformin  to the pharmacy

## 2024-05-01 NOTE — Progress Notes (Signed)
 Catheline Doing, DNP, AGNP-c [facility]  [address] [place], [place] [address] [phone]  ESTABLISHED PATIENT- Chronic Health and/or Follow-Up Visit on 05/01/2024  Blood pressure 132/84, pulse 96, weight 166 lb 9.6 oz (75.6 kg).   HPI: History of Present Illness Julie Orozco is a 78 year old female with diabetes who presents for a follow-up visit for diabetes management.  She does not have a blood sugar meter at home and has not been checking her blood sugar levels regularly. No recent episodes of dizziness, chest pain, palpitations, excessive hunger, thirst, or frequent urination. She is sleeping well at night. She occasionally experiences tiredness in her feet at night, described as a sensation similar to wearing tight shoes, but denies any numbness or tingling.  Her current medications include metformin  and trazodone , which she uses to aid sleep.  She underwent surgery in 2021 and recently had a follow-up appointment where everything was reported to be looking good.  She had a colon cancer screening with Cologuard approximately two and a half to three years ago, before undergoing radiation therapy. She had an eye exam within the last calendar year. The timing of her last tetanus shot is uncertain.  All ROS negative with exception of what is listed above.   PHYSICAL EXAM Physical Exam Vitals and nursing note reviewed.  Constitutional:      Appearance: Normal appearance.  HENT:     Head: Normocephalic.  Eyes:     Pupils: Pupils are equal, round, and reactive to light.  Neck:     Vascular: No carotid bruit.  Cardiovascular:     Rate and Rhythm: Normal rate and regular rhythm.     Pulses: Normal pulses.     Heart sounds: Normal heart sounds.  Pulmonary:     Effort: Pulmonary effort is normal.     Breath sounds: Normal breath sounds.  Abdominal:     General: Bowel sounds are normal. There is no distension.     Palpations: Abdomen is soft.      Tenderness: There is no abdominal tenderness. There is no guarding.  Musculoskeletal:        General: Normal range of motion.     Cervical back: Normal range of motion. No tenderness.     Right lower leg: No edema.     Left lower leg: No edema.  Skin:    General: Skin is warm.     Capillary Refill: Capillary refill takes less than 2 seconds.  Neurological:     General: No focal deficit present.     Mental Status: She is alert and oriented to person, place, and time.     Sensory: No sensory deficit.     Motor: No weakness.     Coordination: Coordination normal.  Psychiatric:        Mood and Affect: Mood normal.        Behavior: Behavior normal.     PLAN Problem List Items Addressed This Visit     Obesity (BMI 30-39.9)   Chronic. Recommend increased physical activity, small meals with high protein and low carbs, increased water  intake, and increased protein to help with management. In the setting of hypertension, diabetes, and hyperlipidemia her risks of cardiovascular disease are elevated which does create a greater urgency for control. Will continue to monitor.       Relevant Medications   metFORMIN  (GLUCOPHAGE ) 500 MG tablet   Hypertension associated with diabetes (HCC)   Hypertension is well-controlled with no recent episodes of chest  pain or palpitations. - Send refills for medications to the pharmacy      Relevant Medications   metFORMIN  (GLUCOPHAGE ) 500 MG tablet   lisinopril  (ZESTRIL ) 10 MG tablet   simvastatin  (ZOCOR ) 20 MG tablet   Other Relevant Orders   Hemoglobin A1c   CBC with Differential/Platelet   Comprehensive metabolic panel with GFR   Lipid panel   Vitamin D  deficiency   Recheck labs today. No concerns present.       Relevant Orders   VITAMIN D  25 Hydroxy (Vit-D Deficiency, Fractures)   Type 2 diabetes mellitus with other specified complication (HCC)   Type 2 diabetes mellitus without reported symptoms of hyperglycemia. No recent blood sugar  monitoring due to lack of a meter. No dizziness reported. Daily blood sugar monitoring is not necessary unless symptomatic. - Prescribe a blood glucose meter for home use in case of symptoms - Send refills for metformin  to the pharmacy      Relevant Medications   metFORMIN  (GLUCOPHAGE ) 500 MG tablet   lisinopril  (ZESTRIL ) 10 MG tablet   simvastatin  (ZOCOR ) 20 MG tablet   Other Relevant Orders   Hemoglobin A1c   CBC with Differential/Platelet   Comprehensive metabolic panel with GFR   Lipid panel   Hyperlipidemia associated with type 2 diabetes mellitus (HCC)   Chronic. Managed with simvastatin  with no reported concerns. Labs monitored.       Relevant Medications   metFORMIN  (GLUCOPHAGE ) 500 MG tablet   lisinopril  (ZESTRIL ) 10 MG tablet   simvastatin  (ZOCOR ) 20 MG tablet   Other Relevant Orders   Hemoglobin A1c   CBC with Differential/Platelet   Comprehensive metabolic panel with GFR   Lipid panel   Insomnia   Sleeping well with PRN trazodone . No concerns present today.       Relevant Medications   traZODone  (DESYREL ) 50 MG tablet   Other Visit Diagnoses       Need for influenza vaccination    -  Primary   Relevant Orders   Flu vaccine HIGH DOSE PF(Fluzone Trivalent) (Completed)     Screening for colon cancer       Relevant Orders   Cologuard       Return in about 6 months (around 10/29/2024) for Med Management 30.  Julie Clela Hagadorn, DNP, AGNP-c Time: 30 minutes, >50% spent counseling, care coordination, chart review, and documentation.  SABRAtime

## 2024-05-01 NOTE — Assessment & Plan Note (Signed)
Chronic. Recommend increased physical activity, small meals with high protein and low carbs, increased water intake, and increased protein to help with management. In the setting of hypertension, diabetes, and hyperlipidemia her risks of cardiovascular disease are elevated which does create a greater urgency for control. Will continue to monitor.

## 2024-05-01 NOTE — Patient Instructions (Addendum)
 It was so good to see you today!!   I have sent refills of your medication to the pharmacy.  Keep up the great work!!  I do recommend getting the Shingles Vaccine and Tetanus Booster from the pharmacy.

## 2024-05-02 LAB — COMPREHENSIVE METABOLIC PANEL WITH GFR
ALT: 13 IU/L (ref 0–32)
AST: 18 IU/L (ref 0–40)
Albumin: 4.3 g/dL (ref 3.8–4.8)
Alkaline Phosphatase: 107 IU/L (ref 44–121)
BUN/Creatinine Ratio: 33 — AB (ref 12–28)
BUN: 18 mg/dL (ref 8–27)
Bilirubin Total: <0.2 mg/dL (ref 0.0–1.2)
CO2: 24 mmol/L (ref 20–29)
Calcium: 9.7 mg/dL (ref 8.7–10.3)
Chloride: 101 mmol/L (ref 96–106)
Creatinine, Ser: 0.55 mg/dL — AB (ref 0.57–1.00)
Globulin, Total: 2.8 g/dL (ref 1.5–4.5)
Glucose: 107 mg/dL — AB (ref 70–99)
Potassium: 4.2 mmol/L (ref 3.5–5.2)
Sodium: 141 mmol/L (ref 134–144)
Total Protein: 7.1 g/dL (ref 6.0–8.5)
eGFR: 95 mL/min/1.73 (ref >59)

## 2024-05-02 LAB — LIPID PANEL
Cholesterol, Total: 159 mg/dL (ref 100–199)
HDL: 58 mg/dL (ref >39)
LDL CALC COMMENT:: 2.7 ratio (ref 0.0–4.4)
LDL Chol Calc (NIH): 73 mg/dL (ref 0–99)
Triglycerides: 163 mg/dL — AB (ref 0–149)
VLDL Cholesterol Cal: 28 mg/dL (ref 5–40)

## 2024-05-02 LAB — CBC WITH DIFFERENTIAL/PLATELET
Basophils Absolute: 0.1 x10E3/uL (ref 0.0–0.2)
Basos: 1 %
EOS (ABSOLUTE): 0.2 x10E3/uL (ref 0.0–0.4)
Eos: 3 %
Hematocrit: 38.3 % (ref 34.0–46.6)
Hemoglobin: 12.6 g/dL (ref 11.1–15.9)
Immature Grans (Abs): 0 x10E3/uL (ref 0.0–0.1)
Immature Granulocytes: 0 %
Lymphocytes Absolute: 1.3 x10E3/uL (ref 0.7–3.1)
Lymphs: 21 %
MCH: 30.4 pg (ref 26.6–33.0)
MCHC: 32.9 g/dL (ref 31.5–35.7)
MCV: 92 fL (ref 79–97)
Monocytes Absolute: 0.5 x10E3/uL (ref 0.1–0.9)
Monocytes: 7 %
Neutrophils Absolute: 4.3 x10E3/uL (ref 1.4–7.0)
Neutrophils: 68 %
Platelets: 242 x10E3/uL (ref 150–450)
RBC: 4.15 x10E6/uL (ref 3.77–5.28)
RDW: 12.6 % (ref 11.7–15.4)
WBC: 6.3 x10E3/uL (ref 3.4–10.8)

## 2024-05-02 LAB — HEMOGLOBIN A1C
Est. average glucose Bld gHb Est-mCnc: 143 mg/dL
Hgb A1c MFr Bld: 6.6 % — ABNORMAL HIGH (ref 4.8–5.6)

## 2024-05-02 LAB — VITAMIN D 25 HYDROXY (VIT D DEFICIENCY, FRACTURES): Vit D, 25-Hydroxy: 74.1 ng/mL (ref 30.0–100.0)

## 2024-05-09 ENCOUNTER — Ambulatory Visit: Payer: Self-pay | Admitting: Nurse Practitioner

## 2024-08-21 ENCOUNTER — Ambulatory Visit: Payer: Self-pay

## 2024-08-21 VITALS — Ht 60.0 in | Wt 168.0 lb

## 2024-08-21 DIAGNOSIS — Z0000 Encounter for general adult medical examination without abnormal findings: Secondary | ICD-10-CM

## 2024-08-21 NOTE — Patient Instructions (Signed)
 [name],  Thank you for taking the time for your Medicare Wellness Visit. I appreciate your continued commitment to your health goals. Please review the care plan we discussed, and feel free to reach out if I can assist you further.  Please note that Annual Wellness Visits do not include a physical exam. Some assessments may be limited, especially if the visit was conducted virtually. If needed, we may recommend an in-person follow-up with your provider.  Ongoing Care Seeing your primary care provider every 3 to 6 months helps us  monitor your health and provide consistent, personalized care.   Referrals If a referral was made during today's visit and you haven't received any updates within two weeks, please contact the referred provider directly to check on the status.  Recommended Screenings:  Health Maintenance  Topic Date Due   Zoster (Shingles) Vaccine (1 of 2) Never done   DTaP/Tdap/Td vaccine (2 - Td or Tdap) 08/30/2021   Cologuard (Stool DNA test)  04/23/2023   Eye exam for diabetics  06/06/2023   Medicare Annual Wellness Visit  03/28/2024   COVID-19 Vaccine (5 - 2025-26 season) 05/01/2025*   Hemoglobin A1C  10/29/2024   Yearly kidney health urinalysis for diabetes  02/12/2025   Yearly kidney function blood test for diabetes  05/01/2025   Complete foot exam   05/01/2025   Pneumococcal Vaccine for age over 29  Completed   Flu Shot  Completed   Osteoporosis screening with Bone Density Scan  Completed   Hepatitis C Screening  Completed   Meningitis B Vaccine  Aged Out   Breast Cancer Screening  Discontinued   Colon Cancer Screening  Discontinued  *Topic was postponed. The date shown is not the original due date.       08/21/2024   10:19 AM  Advanced Directives  Does Patient Have a Medical Advance Directive? Yes  Type of Estate Agent of [place];Living will  Does patient want to make changes to medical advance directive? No - Patient declined   Copy of Healthcare Power of Attorney in Chart? No - copy requested    Vision: Annual vision screenings are recommended for early detection of glaucoma, cataracts, and diabetic retinopathy. These exams can also reveal signs of chronic conditions such as diabetes and high blood pressure.  Dental: Annual dental screenings help detect early signs of oral cancer, gum disease, and other conditions linked to overall health, including heart disease and diabetes.  Please see the attached documents for additional preventive care recommendations.

## 2024-08-21 NOTE — Progress Notes (Signed)
 "  Chief Complaint  Patient presents with   Medicare Wellness     Subjective:   Julie Orozco is a 78 y.o. female who presents for a Medicare Annual Wellness Visit.  Visit info / Clinical Intake: Medicare Wellness Visit Type:: Subsequent Annual Wellness Visit Persons participating in visit and providing information:: patient & caregiver Medicare Wellness Visit Mode:: Telephone If telephone:: video declined Since this visit was completed virtually, some vitals may be partially provided or unavailable. Missing vitals are due to the limitations of the virtual format.: Documented vitals are patient reported If Telephone or Video please confirm:: I connected with patient using audio/video enable telemedicine. I verified patient identity with two identifiers, discussed telehealth limitations, and patient agreed to proceed. Patient Location:: home Provider Location:: office Interpreter Needed?: No Pre-visit prep was completed: yes AWV questionnaire completed by patient prior to visit?: no Living arrangements:: with family/others Patient's Overall Health Status Rating: excellent Typical amount of pain: none Does pain affect daily life?: no Are you currently prescribed opioids?: no  Dietary Habits and Nutritional Risks How many meals a day?: 2 Eats fruit and vegetables daily?: yes Most meals are obtained by: preparing own meals In the last 2 weeks, have you had any of the following?: none Diabetic:: (!) yes Any non-healing wounds?: no How often do you check your BS?: 0 Would you like to be referred to a Nutritionist or for Diabetic Management? : no  Functional Status Activities of Daily Living (to include ambulation/medication): Independent Ambulation: Independent Medication Administration: Needs assistance (comment) Home Management (perform basic housework or laundry): Independent Manage your own finances?: yes Primary transportation is: family / friends Concerns about  vision?: no *vision screening is required for WTM* Concerns about hearing?: no  Fall Screening Falls in the past year?: 0 Number of falls in past year: 0 Was there an injury with Fall?: 0 Fall Risk Category Calculator: 0 Patient Fall Risk Level: Low Fall Risk  Fall Risk Patient at Risk for Falls Due to: Medication side effect Fall risk Follow up: Falls prevention discussed; Falls evaluation completed  Home and Transportation Safety: All rugs have non-skid backing?: yes All stairs or steps have railings?: yes Grab bars in the bathtub or shower?: (!) no Have non-skid surface in bathtub or shower?: yes Good home lighting?: yes Regular seat belt use?: yes Hospital stays in the last year:: no  Cognitive Assessment Difficulty concentrating, remembering, or making decisions? : no Will 6CIT or Mini Cog be Completed: no 6CIT or Mini Cog Declined: patient alert, oriented, able to answer questions appropriately and recall recent events (patient has daughter translate and she is coherent)  [employer] (For Healthcare) Does Patient Have a Medical Advance Directive?: Yes Does patient want to make changes to medical advance directive?: No - Patient declined Type of Advance Directive: Healthcare Power of [place]; Living will Copy of Healthcare Power of Attorney in Chart?: No - copy requested Copy of Living Will in Chart?: No - copy requested  Reviewed/Updated  Reviewed/Updated: Reviewed All (Medical, Surgical, Family, Medications, Allergies, Care Teams, Patient Goals)    Allergies (verified) Patient has no known allergies.   Current Medications (verified) Outpatient Encounter Medications as of 08/21/2024  Medication Sig   acetaminophen  (TYLENOL ) 500 MG tablet Take 500-1,000 mg by mouth every 6 (six) hours as needed (pain.).   Blood Glucose Monitoring Suppl DEVI 1 each by Does not apply route daily. May substitute to any manufacturer covered by patient's insurance.    Cholecalciferol (VITAMIN D3) 25 MCG (  1000 UT) CAPS Take 1,000 Units by mouth daily.   EPINEPHrine  (EPIPEN  2-PAK) 0.3 mg/0.3 mL IJ SOAJ injection Inject 0.3 mg into the muscle as needed for anaphylaxis. Call 911 immediately.   Glucose Blood (BLOOD GLUCOSE TEST STRIPS) STRP 1 each by Does not apply route daily. May substitute to any manufacturer covered by patient's insurance.   Lancets Misc. MISC 1 each by Does not apply route daily. May substitute to any manufacturer covered by patient's insurance.   lisinopril  (ZESTRIL ) 10 MG tablet Take 1 tablet (10 mg total) by mouth daily.   metFORMIN  (GLUCOPHAGE ) 500 MG tablet Take 1 tablet (500 mg total) by mouth daily with breakfast.   simvastatin  (ZOCOR ) 20 MG tablet Take 1 tablet (20 mg total) by mouth at bedtime.   traZODone  (DESYREL ) 50 MG tablet Take 1-2 tablets (50-100 mg total) by mouth at bedtime as needed for sleep.   ibuprofen  (ADVIL ) 200 MG tablet Take 600 mg by mouth every 8 (eight) hours as needed (pain.). (Patient not taking: Reported on 08/21/2024)   Lancets Ultra Fine MISC Lancets for use with Acucheck guide me meter. For blood sugar monitoring up to four times a day. (Patient not taking: Reported on 08/21/2024)   meclizine  (ANTIVERT ) 25 MG tablet Take 0.5-1 tablets (12.5-25 mg total) by mouth 2 (two) times daily. (Patient not taking: Reported on 08/21/2024)   No facility-administered encounter medications on file as of 08/21/2024.    History: Past Medical History:  Diagnosis Date   Asymptomatic microscopic hematuria 06/05/2020   Dizziness 02/13/2024   DM type 2 (diabetes mellitus, type 2) (HCC)    Dysuria 08/09/2022   Elevated ALT measurement 04/18/2018   resolved   Elevated liver enzymes 06/27/2019   Endometrial cancer (HCC)    History of dilatation and curettage 04/29/2020   History of radiation therapy 10/15/20-11/18/20   IMRT endometrium - Dr. Lynwood Nasuti   History of radiation therapy 11/27/2020, 12/02/2020, 12/09/2020   vaginal  brachytherapy - HDR to endometrium      Dr Lynwood Nasuti   Hyperlipidemia    Hypertension    Intermittent palpitations 06/05/2020   Intermittent palpitations 06/05/2020   Obesity    Osteopenia determined by x-ray    PMB (postmenopausal bleeding)    PMB (postmenopausal bleeding) 07/18/2013   SVD (spontaneous vaginal delivery)    x 8   Systemic reaction to insect sting 04/23/2023   Upper respiratory tract infection 10/05/2022   Vulvar candidiasis 09/16/2020   Past Surgical History:  Procedure Laterality Date   DILATATION & CURETTAGE/HYSTEROSCOPY WITH MYOSURE N/A 07/18/2020   Procedure: DILATATION & CURETTAGE/HYSTEROSCOPY WITH MYOSURE;  Surgeon: Lavoie, Marie-Lyne, MD;  [facility]: [facility];  Service: Gynecology;  Laterality: N/A;   DILATION AND CURETTAGE OF UTERUS N/A 01/25/2014   Procedure: DILATATION AND CURETTAGE;  Surgeon: Harland JAYSON Birkenhead, MD;  Location: [facility];  Service: Gynecology;  Laterality: N/A;   MULTIPLE TOOTH EXTRACTIONS  yrs ago   ROBOTIC ASSISTED TOTAL HYSTERECTOMY WITH BILATERAL SALPINGO OOPHERECTOMY N/A 08/27/2020   Procedure: XI ROBOTIC ASSISTED TOTAL HYSTERECTOMY WITH BILATERAL SALPINGO OOPHORECTOMY,;  Surgeon: Viktoria Comer SAUNDERS, MD;  Location: [facility];  Service: Gynecology;  Laterality: N/A;   SENTINEL NODE BIOPSY N/A 08/27/2020   Procedure: SENTINEL LYMPH NODE BIOPSY;  Surgeon: Viktoria Comer SAUNDERS, MD;  Location: [facility];  Service: Gynecology;  Laterality: N/A;   TUBAL LIGATION  yrs ago   Family History  Problem Relation Age of Onset   Hypertension Mother    Alcohol abuse Father  Anemia Brother    Alcohol abuse Brother    Breast cancer Cousin        maternal cousin   Colon cancer Neg Hx    Esophageal cancer Neg Hx    Rectal cancer Neg Hx    Stomach cancer Neg Hx    Ovarian cancer Neg Hx    Social History   Occupational History   Not on file  Tobacco Use   Smoking status: Never   Smokeless tobacco: Never  Vaping Use   Vaping status:  Never Used  Substance and Sexual Activity   Alcohol use: No   Drug use: No   Sexual activity: Not Currently    Birth control/protection: Post-menopausal   Tobacco Counseling Counseling given: Not Answered  SDOH Screenings   Food Insecurity: No Food Insecurity (08/21/2024)  Housing: Unknown (08/21/2024)  Transportation Needs: No Transportation Needs (08/21/2024)  Utilities: Not At Risk (08/21/2024)  Alcohol Screen: Low Risk (08/21/2024)  Depression (PHQ2-9): Low Risk (08/21/2024)  Financial Resource Strain: Low Risk (08/21/2024)  Physical Activity: Insufficiently Active (08/21/2024)  Social Connections: Moderately Integrated (08/21/2024)  Stress: No Stress Concern Present (08/21/2024)  Tobacco Use: Low Risk (08/21/2024)  Health Literacy: Adequate Health Literacy (08/21/2024)   See flowsheets for full screening details  Depression Screen PHQ 2 & 9 Depression Scale- Over the past 2 weeks, how often have you been bothered by any of the following problems? Little interest or pleasure in doing things: 0 Feeling down, depressed, or hopeless (PHQ Adolescent also includes...irritable): 1 PHQ-2 Total Score: 1 Trouble falling or staying asleep, or sleeping too much: 1 Feeling tired or having little energy: 1 Poor appetite or overeating (PHQ Adolescent also includes...weight loss): 0 Feeling bad about yourself - or that you are a failure or have let yourself or your family down: 0 Trouble concentrating on things, such as reading the newspaper or watching television (PHQ Adolescent also includes...like school work): 0 Moving or speaking so slowly that other people could have noticed. Or the opposite - being so fidgety or restless that you have been moving around a lot more than usual: 0 Thoughts that you would be better off dead, or of hurting yourself in some way: 0 PHQ-9 Total Score: 3 If you checked off any problems, how difficult have these problems made it for you to do your work,  take care of things at home, or get along with other people?: Not difficult at all  Depression Treatment Depression Interventions/Treatment : EYV7-0 Score <4 Follow-up Not Indicated     Goals Addressed             This Visit's Progress    Patient Stated       08/21/2024, stay healthy             Objective:    Today's Vitals   08/21/24 1010  Weight: 168 lb (76.2 kg)  Height: 5' (1.524 m)   Body mass index is 32.81 kg/m.  Hearing/Vision screen Hearing Screening - Comments:: Denies hearing issues Vision Screening - Comments:: No regular eye exams Immunizations and Health Maintenance Health Maintenance  Topic Date Due   Zoster Vaccines- Shingrix (1 of 2) Never done   DTaP/Tdap/Td (2 - Td or Tdap) 08/30/2021   Fecal DNA (Cologuard)  04/23/2023   OPHTHALMOLOGY EXAM  06/06/2023   COVID-19 Vaccine (5 - 2025-26 season) 05/01/2025 (Originally 04/30/2024)   HEMOGLOBIN A1C  10/29/2024   Diabetic kidney evaluation - Urine ACR  02/12/2025   Diabetic kidney evaluation - eGFR  measurement  05/01/2025   FOOT EXAM  05/01/2025   Medicare Annual Wellness (AWV)  08/21/2025   Pneumococcal Vaccine: 50+ Years  Completed   Influenza Vaccine  Completed   Bone Density Scan  Completed   Hepatitis C Screening  Completed   Meningococcal B Vaccine  Aged Out   Mammogram  Discontinued   Colonoscopy  Discontinued        Assessment/Plan:  This is a routine wellness examination for [place].  Patient Care Team: Early, Camie BRAVO, NP as PCP - General (Nurse Practitioner) Shannon Agent, MD as Consulting Physician (Radiation Oncology) Viktoria Comer SAUNDERS, MD as Consulting Physician (Gynecologic Oncology)  I have personally reviewed and noted the following in the patients chart:   Medical and social history Use of alcohol, tobacco or illicit drugs  Current medications and supplements including opioid prescriptions. Functional ability and status Nutritional status Physical  activity Advanced directives List of other physicians Hospitalizations, surgeries, and ER visits in previous 12 months Vitals Screenings to include cognitive, depression, and falls Referrals and appointments  No orders of the defined types were placed in this encounter.  In addition, I have reviewed and discussed with patient certain preventive protocols, quality metrics, and best practice recommendations. A written personalized care plan for preventive services as well as general preventive health recommendations were provided to patient.   Julie BRAVO Dawn, LPN   [id number]   Return in 1 year (on 08/21/2025).  After Visit Summary: (MyChart) Due to this being a telephonic visit, the after visit summary with patients personalized plan was offered to patient via MyChart   Nurse Notes: HM Addressed: Vaccines Due: TDAP and shingles Going to get a new eye doctor. Has cologuard kit at home  "

## 2024-10-03 ENCOUNTER — Ambulatory Visit

## 2024-10-03 ENCOUNTER — Ambulatory Visit: Payer: Self-pay

## 2024-10-03 VITALS — BP 120/70 | HR 64 | Temp 98.1°F | Ht 60.0 in | Wt 170.0 lb

## 2024-10-03 DIAGNOSIS — R300 Dysuria: Secondary | ICD-10-CM

## 2024-10-03 DIAGNOSIS — E6609 Other obesity due to excess calories: Secondary | ICD-10-CM

## 2024-10-03 DIAGNOSIS — N3001 Acute cystitis with hematuria: Secondary | ICD-10-CM

## 2024-10-03 LAB — POCT URINALYSIS DIP (CLINITEK)
Bilirubin, UA: NEGATIVE
Glucose, UA: NEGATIVE mg/dL
Ketones, POC UA: NEGATIVE mg/dL
Nitrite, UA: NEGATIVE
POC PROTEIN,UA: 100 — AB
Spec Grav, UA: >=1.03 — AB
Urobilinogen, UA: 0.2 U/dL
pH, UA: 5.5

## 2024-10-03 MED ORDER — SULFAMETHOXAZOLE-TRIMETHOPRIM 800-160 MG PO TABS: 1.0000 | ORAL_TABLET | Freq: Two times a day (BID) | ORAL | 0 refills | Status: AC

## 2024-10-03 NOTE — Telephone Encounter (Signed)
 FYI Only or Action Required?: FYI only for provider: appointment scheduled on 08/02/25.  Patient was last seen in primary care on 05/01/2024 by Early, Camie BRAVO, NP.  Called Nurse Triage reporting Dysuria and Urinary Frequency.  Symptoms began yesterday.  Interventions attempted: Rest, hydration, or home remedies.  Symptoms are: gradually worsening.  Triage Disposition: See HCP Within 4 Hours (Or PCP Triage)  Patient/caregiver understands and will follow disposition?: Yes   Reason for Disposition  Diabetes mellitus or weak immune system (e.g., HIV positive, cancer chemo, splenectomy, organ transplant, chronic steroids)  Answer Assessment - Initial Assessment Questions Spoke with patient's daughter, Violeta, who states that patient has been experiencing burning with urination and frequency since yesterday. She is still able to urinate, but does mention that it's a trickle of urine. She denies any fevers. Office visit advised.   1. SEVERITY: How bad is the pain?  (e.g., Scale 1-10; mild, moderate, or severe)     Moderate  2. FREQUENCY: How many times have you had painful urination today?      Every 20 minutes  3. PATTERN: Is pain present every time you urinate or just sometimes?      Yes  4. ONSET: When did the painful urination start?      Yesterday  5. FEVER: Do you have a fever? If Yes, ask: What is your temperature, how was it measured, and when did it start?     No  6. PAST UTI: Have you had a urine infection before? If Yes, ask: When was the last time? and What happened that time?      Unknown  7. CAUSE: What do you think is causing the painful urination?  (e.g., UTI, scratch, Herpes sore)     UTI  8. OTHER SYMPTOMS: Do you have any other symptoms? (e.g., blood in urine, flank pain, genital sores, urgency, vaginal discharge)     Denies any other symptoms  9. PREGNANCY: Is there any chance you are pregnant? When was your last menstrual period?      NA  Protocols used: Urination Pain - Female-A-AH  Reason for Triage: Possible UTI, burns when going to the bathroom. Going every 20 mins. No fever

## 2024-10-03 NOTE — Addendum Note (Signed)
 Addended by: CRAVEN ADELIA PARAS on: 10/03/2024 02:11 PM   Modules accepted: Orders

## 2024-10-03 NOTE — Progress Notes (Signed)
 I,Jameka J Llittleton, CMA,acting as a neurosurgeon for Julie JONELLE Fischer, DO.,have documented all relevant documentation on the behalf of Julie JONELLE Fischer, DO,as directed by  Julie JONELLE Fischer, DO while in the presence of Julie JONELLE Fischer, DO.  Subjective:  Patient ID: Julie Orozco , female    DOB: 11/02/1945 , 79 y.o.   MRN: [id number]  Chief Complaint  Patient presents with   Dysuria    Patient presents today for a possible uti. She reports she has been having urinary frequency and a burning sensation. She reports when she does go to urinate it is very little.     HPI  HPI   Past Medical History:  Diagnosis Date   Asymptomatic microscopic hematuria 06/05/2020   Dizziness 02/13/2024   DM type 2 (diabetes mellitus, type 2) (HCC)    Dysuria 08/09/2022   Elevated ALT measurement 04/18/2018   resolved   Elevated liver enzymes 06/27/2019   Endometrial cancer (HCC)    History of dilatation and curettage 04/29/2020   History of radiation therapy 10/15/20-11/18/20   IMRT endometrium - Dr. Lynwood Nasuti   History of radiation therapy 11/27/2020, 12/02/2020, 12/09/2020   vaginal brachytherapy - [facility] to endometrium      Dr Lynwood Nasuti   Hyperlipidemia    Hypertension    Intermittent palpitations 06/05/2020   Intermittent palpitations 06/05/2020   Obesity    Osteopenia determined by x-ray    PMB (postmenopausal bleeding)    PMB (postmenopausal bleeding) 07/18/2013   SVD (spontaneous vaginal delivery)    x 8   Systemic reaction to insect sting 04/23/2023   Upper respiratory tract infection 10/05/2022   Vulvar candidiasis 09/16/2020     Family History  Problem Relation Age of Onset   Hypertension Mother    Alcohol abuse Father    Anemia Brother    Alcohol abuse Brother    Breast cancer Cousin        maternal cousin   Colon cancer Neg Hx    Esophageal cancer Neg Hx    Rectal cancer Neg Hx    Stomach cancer Neg Hx    Ovarian cancer Neg Hx     Current Medications[1]   Allergies[2]    Review of Systems   Today's Vitals   10/03/24 1233  Temp: 98.1 F (36.7 C)  TempSrc: Oral  Weight: 170 lb (77.1 kg)  Height: 5' (1.524 m)   Body mass index is 33.2 kg/m.  Wt Readings from Last 3 Encounters:  10/03/24 170 lb (77.1 kg)  08/21/24 168 lb (76.2 kg)  05/01/24 166 lb 9.6 oz (75.6 kg)    The ASCVD Risk score (Arnett DK, et al., 2019) failed to calculate for the following reasons:   The systolic blood pressure is missing  Objective:  Physical Exam      Assessment And Plan:   Assessment & Plan Dysuria  Class 1 obesity due to excess calories with serious comorbidity and body mass index (BMI) of 33.0 to 33.9 in adult   No orders of the defined types were placed in this encounter.    Return if symptoms worsen or fail to improve.  Patient was given opportunity to ask questions. Patient verbalized understanding of the plan and was able to repeat key elements of the plan. All questions were answered to their satisfaction.    LILLETTE Julie JONELLE Fischer, DO, have reviewed all documentation for this visit. The documentation on 10/03/24 for the exam, diagnosis, procedures, and orders are all accurate and  complete.   IF YOU HAVE BEEN REFERRED TO A SPECIALIST, IT MAY TAKE 1-2 WEEKS TO SCHEDULE/PROCESS THE REFERRAL. IF YOU HAVE NOT HEARD FROM US /SPECIALIST IN TWO WEEKS, PLEASE GIVE US  A CALL AT [phone].     [1]  Current Outpatient Medications:    acetaminophen  (TYLENOL ) 500 MG tablet, Take 500-1,000 mg by mouth every 6 (six) hours as needed (pain.)., Disp: , Rfl:    Blood Glucose Monitoring Suppl DEVI, 1 each by Does not apply route daily. May substitute to any manufacturer covered by patient's insurance., Disp: 1 each, Rfl: 0   Cholecalciferol (VITAMIN D3) 25 MCG (1000 UT) CAPS, Take 1,000 Units by mouth daily., Disp: , Rfl:    EPINEPHrine  (EPIPEN  2-PAK) 0.3 mg/0.3 mL IJ SOAJ injection, Inject 0.3 mg into the muscle as needed for anaphylaxis. Call 911 immediately.,  Disp: 2 each, Rfl: 1   Glucose Blood (BLOOD GLUCOSE TEST STRIPS) STRP, 1 each by Does not apply route daily. May substitute to any manufacturer covered by patient's insurance., Disp: 100 strip, Rfl: 5   ibuprofen  (ADVIL ) 200 MG tablet, Take 600 mg by mouth every 8 (eight) hours as needed (pain.)., Disp: , Rfl:    Lancets Misc. MISC, 1 each by Does not apply route daily. May substitute to any manufacturer covered by patient's insurance., Disp: 100 each, Rfl: 5   Lancets Ultra Fine MISC, Lancets for use with Acucheck guide me meter. For blood sugar monitoring up to four times a day., Disp: 200 each, Rfl: 99   lisinopril  (ZESTRIL ) 10 MG tablet, Take 1 tablet (10 mg total) by mouth daily., Disp: 90 tablet, Rfl: 3   meclizine  (ANTIVERT ) 25 MG tablet, Take 0.5-1 tablets (12.5-25 mg total) by mouth 2 (two) times daily., Disp: 30 tablet, Rfl: 0   metFORMIN  (GLUCOPHAGE ) 500 MG tablet, Take 1 tablet (500 mg total) by mouth daily with breakfast., Disp: 90 tablet, Rfl: 3   simvastatin  (ZOCOR ) 20 MG tablet, Take 1 tablet (20 mg total) by mouth at bedtime., Disp: 90 tablet, Rfl: 3   traZODone  (DESYREL ) 50 MG tablet, Take 1-2 tablets (50-100 mg total) by mouth at bedtime as needed for sleep., Disp: 180 tablet, Rfl: 3 [2] No Known Allergies

## 2024-10-03 NOTE — Progress Notes (Signed)
" ° °  Acute Office Visit  Subjective:     Patient ID: Julie Orozco, female    DOB: 12/20/1945, 79 y.o.   MRN: [id number]  Chief Complaint  Patient presents with   Dysuria    Patient presents today for a possible uti. She presents today with her Daughter. She reports she has been having urinary frequency and a burning sensation. She reports when she does go to urinate it is very little.     This is a 79 year old Hispanic female who is here with her daughter.  Her daughter is a nurse, learning disability today.  This patient states to her daughter that she has been having dysuria for the past 2 days that is getting worse, chills when she is urinating and she has been having increased amount of frequency.  She denies any blood in the urine or a foul odor to the urine.  She denies any fevers or any flank pain.  She has no known drug allergies to medications.  She does have a past medical history of DIABETES MELLITUS.     Review of Systems  Constitutional:  Positive for chills. Negative for fever.  Gastrointestinal:  Negative for abdominal pain.  Genitourinary:  Positive for dysuria and frequency. Negative for flank pain and hematuria.        Objective:    Vitals:   10/03/24 1233  BP: 120/70  Pulse: 64  Temp: 98.1 F (36.7 C)  Height: 5' (1.524 m)  Weight: 170 lb (77.1 kg)  TempSrc: Oral  BMI (Calculated): 33.2      Physical Exam Vitals reviewed.  Constitutional:      Appearance: Normal appearance.  Cardiovascular:     Rate and Rhythm: Normal rate and regular rhythm.     Pulses: Normal pulses.     Heart sounds: Normal heart sounds.  Pulmonary:     Effort: Pulmonary effort is normal.     Breath sounds: Normal breath sounds.  Abdominal:     General: Abdomen is flat. Bowel sounds are normal.     Palpations: Abdomen is soft.     Tenderness: There is no abdominal tenderness. There is no right CVA tenderness or left CVA tenderness.  Neurological:     Mental Status: She is alert.      No results found for any visits on 10/03/24.      Assessment & Plan:   Assessment & Plan Dysuria The urinalysis done on 10/03/24 shows the presence of trace amount of leukocytes along with blood.  The urine was sent off for culture on 10/03/24. Orders:   POCT URINALYSIS DIP (CLINITEK)  Class 1 obesity due to excess calories with serious comorbidity and body mass index (BMI) of 33.0 to 33.9 in adult     Acute cystitis with hematuria This patient was started on Bactrim  double strength 1 tablet twice a day with food beginning on 10/03/24.  I advised this patient to drink at least 60 ounces of total fluids a day and to message me back if she develops a yeast infection from the antibiotic because she is a diabetic.  She voiced understanding. Orders:   sulfamethoxazole -trimethoprim  (BACTRIM  DS) 800-160 MG tablet; Take 1 tablet by mouth 2 (two) times daily for 7 days.   Return if symptoms worsen or fail to improve.  [place] JONELLE Fischer, DO   "

## 2024-10-05 LAB — URINE CULTURE

## 2024-10-25 ENCOUNTER — Ambulatory Visit: Payer: Medicare Other | Admitting: Radiation Oncology

## 2024-10-30 ENCOUNTER — Encounter: Payer: Self-pay | Admitting: Nurse Practitioner

## 2025-04-22 ENCOUNTER — Ambulatory Visit: Admitting: Gynecologic Oncology

## 2025-09-03 ENCOUNTER — Ambulatory Visit
